# Patient Record
Sex: Female | Born: 1960 | Race: Black or African American | Hispanic: No | Marital: Single | Smoking: Former smoker
Health system: Southern US, Community
[De-identification: ages and names within clinical notes are randomized; demographics above are authoritative.]

## PROBLEM LIST (undated history)

## (undated) DIAGNOSIS — I2510 Atherosclerotic heart disease of native coronary artery without angina pectoris: Secondary | ICD-10-CM

## (undated) DIAGNOSIS — I5032 Chronic diastolic (congestive) heart failure: Secondary | ICD-10-CM

## (undated) DIAGNOSIS — J449 Chronic obstructive pulmonary disease, unspecified: Secondary | ICD-10-CM

## (undated) DIAGNOSIS — I10 Essential (primary) hypertension: Secondary | ICD-10-CM

## (undated) HISTORY — PX: ABDOMINAL HYSTERECTOMY: SHX81

---

## 2005-06-08 ENCOUNTER — Emergency Department: Payer: Self-pay | Admitting: Emergency Medicine

## 2010-07-01 ENCOUNTER — Ambulatory Visit: Payer: Self-pay | Admitting: Family Medicine

## 2011-07-16 ENCOUNTER — Ambulatory Visit: Payer: Self-pay | Admitting: Family Medicine

## 2012-01-20 ENCOUNTER — Ambulatory Visit: Payer: Self-pay | Admitting: General Surgery

## 2012-07-19 ENCOUNTER — Ambulatory Visit: Payer: Self-pay | Admitting: Family Medicine

## 2013-07-19 ENCOUNTER — Ambulatory Visit: Payer: Self-pay | Admitting: Family Medicine

## 2013-07-28 ENCOUNTER — Ambulatory Visit: Payer: Self-pay | Admitting: Family Medicine

## 2014-07-25 ENCOUNTER — Ambulatory Visit
Admit: 2014-07-25 | Disposition: A | Discharge status: Home / Self Care | Payer: Self-pay | Attending: Family Medicine | Admitting: Family Medicine

## 2021-08-23 ENCOUNTER — Other Ambulatory Visit: Payer: Self-pay

## 2021-08-23 ENCOUNTER — Emergency Department
Admission: EM | Admit: 2021-08-23 | Discharge: 2021-08-23 | Disposition: A | Discharge status: Home / Self Care | Payer: Self-pay | Attending: Emergency Medicine | Admitting: Emergency Medicine

## 2021-08-23 ENCOUNTER — Emergency Department: Payer: Self-pay

## 2021-08-23 DIAGNOSIS — E871 Hypo-osmolality and hyponatremia: Secondary | ICD-10-CM | POA: Insufficient documentation

## 2021-08-23 DIAGNOSIS — R42 Dizziness and giddiness: Secondary | ICD-10-CM | POA: Insufficient documentation

## 2021-08-23 DIAGNOSIS — E876 Hypokalemia: Secondary | ICD-10-CM | POA: Insufficient documentation

## 2021-08-23 HISTORY — DX: Essential (primary) hypertension: I10

## 2021-08-23 LAB — CBC
HCT: 40.6 % (ref 36.0–46.0)
Hemoglobin: 13.7 g/dL (ref 12.0–15.0)
MCH: 30.5 pg (ref 26.0–34.0)
MCHC: 33.7 g/dL (ref 30.0–36.0)
MCV: 90.4 fL (ref 80.0–100.0)
Platelets: 351 10*3/uL (ref 150–400)
RBC: 4.49 MIL/uL (ref 3.87–5.11)
RDW: 11.5 % (ref 11.5–15.5)
WBC: 8.7 10*3/uL (ref 4.0–10.5)
nRBC: 0 % (ref 0.0–0.2)

## 2021-08-23 LAB — URINALYSIS, ROUTINE W REFLEX MICROSCOPIC
Bilirubin Urine: NEGATIVE
Glucose, UA: NEGATIVE mg/dL
Hgb urine dipstick: NEGATIVE
Ketones, ur: NEGATIVE mg/dL
Leukocytes,Ua: NEGATIVE
Nitrite: NEGATIVE
Protein, ur: NEGATIVE mg/dL
Specific Gravity, Urine: 1.015 (ref 1.005–1.030)
pH: 7 (ref 5.0–8.0)

## 2021-08-23 LAB — COMPREHENSIVE METABOLIC PANEL
ALT: 12 U/L (ref 0–44)
AST: 19 U/L (ref 15–41)
Albumin: 4.3 g/dL (ref 3.5–5.0)
Alkaline Phosphatase: 80 U/L (ref 38–126)
Anion gap: 8 (ref 5–15)
BUN: 9 mg/dL (ref 8–23)
CO2: 34 mmol/L — ABNORMAL HIGH (ref 22–32)
Calcium: 9.3 mg/dL (ref 8.9–10.3)
Chloride: 87 mmol/L — ABNORMAL LOW (ref 98–111)
Creatinine, Ser: 0.65 mg/dL (ref 0.44–1.00)
GFR, Estimated: >60 mL/min (ref >60)
Glucose, Bld: 106 mg/dL — ABNORMAL HIGH (ref 70–99)
Potassium: 3 mmol/L — ABNORMAL LOW (ref 3.5–5.1)
Sodium: 129 mmol/L — ABNORMAL LOW (ref 135–145)
Total Bilirubin: 0.8 mg/dL (ref 0.3–1.2)
Total Protein: 8.2 g/dL — ABNORMAL HIGH (ref 6.5–8.1)

## 2021-08-23 MED ORDER — MECLIZINE HCL 25 MG PO TABS: 25.0000 mg | ORAL_TABLET | Freq: Three times a day (TID) | ORAL | 0 refills | Status: DC | PRN

## 2021-08-23 MED ORDER — POTASSIUM CHLORIDE CRYS ER 20 MEQ PO TBCR: 20.0000 meq | EXTENDED_RELEASE_TABLET | Freq: Two times a day (BID) | ORAL | 0 refills | Status: DC

## 2021-08-23 MED ORDER — FLUTICASONE PROPIONATE 50 MCG/ACT NA SUSP: 1.0000 | Freq: Two times a day (BID) | NASAL | 0 refills | Status: DC

## 2021-08-23 MED ORDER — POTASSIUM CHLORIDE CRYS ER 20 MEQ PO TBCR
40.0000 meq | EXTENDED_RELEASE_TABLET | Freq: Once | ORAL | Status: AC
  Administered 2021-08-23: 40 meq via ORAL
  Filled 2021-08-23: qty 2

## 2021-08-23 NOTE — ED Provider Notes (Signed)
[facility] Provider Note  Patient Contact: 4:58 PM (approximate)   History   Dizziness   HPI  Katie Moody is a 61 y.o. female who presents the emergency department complaining of dizziness.  Patient states that she felt like she was spinning.  She has had some allergy symptoms with nasal congestion and sneezing.  However she denies other complaint such as fevers, ear pain, sore throat, cough.  She denies any chest pain, shortness of breath.  When she arrived patient had an O2 saturation of 90% and was put on 2 L O2.  She does not wear oxygen at home, does not feel short of breath, states that she is doing her normal activity without any dyspnea on exertion either.  Patient denies any GI complaints.  No urinary symptoms to include dysuria, polyuria, hematuria.  She states that she has had vertigo in the past and this feels similar.  Patient states that currently sitting still she has no dizziness.     Physical Exam   Triage Vital Signs: ED Triage Vitals  Enc Vitals Group     BP 08/23/21 1541 130/88     Pulse Rate 08/23/21 1541 82     Resp 08/23/21 1541 20     Temp 08/23/21 1542 98.4 F (36.9 C)     Temp Source 08/23/21 1542 Oral     SpO2 08/23/21 1541 90 %     Weight 08/23/21 1540 115 lb (52.2 kg)     Height 08/23/21 1540 5\' 8"  (1.727 m)     Head Circumference --      Peak Flow --      Pain Score 08/23/21 1540 0     Pain Loc --      Pain Edu? --      Excl. in GC? --     Most recent vital signs: Vitals:   08/23/21 1730 08/23/21 1830  BP: 139/81 124/82  Pulse: 65 65  Resp: 20 15  Temp:    SpO2: 100% 97%     General: Alert and in no acute distress. Eyes:  PERRL. EOMI Head: No acute traumatic findings ENT:      Ears: EACs unremarkable bilaterally.  Minimal bulging of the TMs bilaterally.  No injection of the TMs.      Nose: No congestion/rhinnorhea.      Mouth/Throat: Mucous membranes are moist. Neck: No stridor. No cervical spine  tenderness to palpation. Hematological/Lymphatic/Immunilogical: No cervical lymphadenopathy. Cardiovascular:  Good peripheral perfusion.  No murmurs, rubs, gallops. Respiratory: Normal respiratory effort without tachypnea or retractions. Lungs CTAB. Good air entry to the bases with no decreased or absent breath sounds. Gastrointestinal: Bowel sounds 4 quadrants. Soft and nontender to palpation. No guarding or rigidity. No palpable masses. No distention. No CVA tenderness. Musculoskeletal: Full range of motion to all extremities.  Neurologic:  No gross focal neurologic deficits are appreciated.  Cranial nerves II through XII grossly intact.  Negative Romberg's and pronator drift.  Patient was visualized walking and was able to walk in a straight line without any drift. Skin:   No rash noted Other:   ED Results / Procedures / Treatments   Labs (all labs ordered are listed, but only abnormal results are displayed) Labs Reviewed  URINALYSIS, ROUTINE W REFLEX MICROSCOPIC - Abnormal; Notable for the following components:      Result Value   Color, Urine YELLOW (*)    APPearance CLEAR (*)    All other components within normal limits  COMPREHENSIVE METABOLIC PANEL - Abnormal; Notable for the following components:   Sodium 129 (*)    Potassium 3.0 (*)    Chloride 87 (*)    CO2 34 (*)    Glucose, Bld 106 (*)    Total Protein 8.2 (*)    All other components within normal limits  CBC     EKG  ED ECG REPORT I, Delorise Royals Kamari Buch,  personally viewed and interpreted this ECG.   Date: 08/23/2021  EKG Time: 1548 hrs.  Rate: 77 bpm  Rhythm: Normal sinus rhythm.  No previous EKGs for comparison.  Flattening of the T waves in the septal leads, T wave abnormality in the lateral leads.  Axis: Normal axis  Intervals:none  ST&T Change: No ST elevation or depression noted.  Flattening of the T waves in the V1 and V2 leads, inverted T waves in the lateral leads.  Normal sinus rhythm.  No  STEMI.  Nonspecific T wave changes.  No previous EKG for comparison.    RADIOLOGY  I personally viewed, evaluated, and interpreted these images as part of my medical decision making, as well as reviewing the written report by the radiologist.  ED Provider Interpretation: Emphysema identified on chest x-ray but no acute cardiopulmonary findings.  DG Chest 2 View  Result Date: 08/23/2021 CLINICAL DATA:  shortness of breath EXAM: CHEST - 2 VIEW COMPARISON:  None available FINDINGS: Cardiomediastinal silhouette and pulmonary vasculature are within normal limits. Diffuse atherosclerotic calcifications of the thoracic aorta. Lungs are hyperexpanded, but otherwise clear clear. IMPRESSION: 1. No acute cardiopulmonary process 2. Emphysema Electronically Signed   By: Acquanetta Belling M.D.   On: 08/23/2021 17:28    PROCEDURES:  Critical Care performed: No  Procedures   MEDICATIONS ORDERED IN ED: Medications  potassium chloride SA (KLOR-CON M) CR tablet 40 mEq (has no administration in time range)     IMPRESSION / MDM / ASSESSMENT AND PLAN / ED COURSE  I reviewed the triage vital signs and the nursing notes.                              Differential diagnosis includes, but is not limited to, vertigo, CVA, hypoxia, pneumonia, COPD, emphysema, anemia, electrolyte abnormality, UTI   Patient's diagnosis is consistent with vertigo, hypokalemia, hyponatremia.  Patient presented to the ED with some dizziness with no other accompanying symptoms.  Her O2 saturation was 90% in triage that she did not endorse any shortness of breath.  She does have emphysema and immediately trended up to 100% on oxygen.  While in the room evaluating the patient I stopped patient's O2, she maintained between 92 to 94% without oxygen in the room. Her exam was reassuring at this time.  She was not currently experiencing symptoms.  She states that she does have a history of vertigo and symptoms feel similar.  Slight hyponatremia  and slight hypokalemia.  We will replenish potassium orally.  Encourage patient to have good oral hydration at home.  Do not feel that patient requires neuro imaging at this time.  During her stay, remove the patient's oxygen, she was sleeping on my reevaluation and still had an O2 saturation of 94%.  Do not feel that the patient truly had an increased oxygen demand.  At this time patient will be treated for vertigo, hypokalemia and instructions on how to correct hyponatremia at home.  Return precautions discussed at length with the patient.  Follow-up primary  care as needed.  Patient is given ED precautions to return to the ED for any worsening or new symptoms.        FINAL CLINICAL IMPRESSION(S) / ED DIAGNOSES   Final diagnoses:  Vertigo  Hypokalemia  Hyponatremia     Rx / DC Orders   ED Discharge Orders          Ordered    fluticasone (FLONASE) 50 MCG/ACT nasal spray  2 times daily        08/23/21 1904    meclizine (ANTIVERT) 25 MG tablet  3 times daily PRN        08/23/21 1904    potassium chloride SA (KLOR-CON M) 20 MEQ tablet  2 times daily        08/23/21 1904             Note:  This document was prepared using Dragon voice recognition software and may include unintentional dictation errors.   Lanette HampshireCuthriell, Selden Noteboom D, PA-C 08/23/21 Orland [id number]    Stafford, Phillip, MD 08/23/21 [phone]

## 2021-08-23 NOTE — ED Notes (Signed)
Pt placed on 2L [place]

## 2021-08-23 NOTE — ED Triage Notes (Signed)
Pt states she has been having dizziness since last night- pt state she feels like she is gonna fall when she walks- pt denies cp, ha, and shob

## 2022-02-04 ENCOUNTER — Other Ambulatory Visit: Payer: Self-pay

## 2022-02-04 DIAGNOSIS — Z1231 Encounter for screening mammogram for malignant neoplasm of breast: Secondary | ICD-10-CM

## 2022-02-09 ENCOUNTER — Ambulatory Visit: Payer: Self-pay | Attending: Hematology and Oncology | Admitting: Hematology and Oncology

## 2022-02-09 ENCOUNTER — Ambulatory Visit
Admission: RE | Admit: 2022-02-09 | Discharge: 2022-02-09 | Disposition: A | Discharge status: Home / Self Care | Payer: Self-pay | Source: Ambulatory Visit | Attending: Obstetrics and Gynecology | Admitting: Obstetrics and Gynecology

## 2022-02-09 VITALS — BP 125/76 | Wt 109.3 lb

## 2022-02-09 DIAGNOSIS — Z1231 Encounter for screening mammogram for malignant neoplasm of breast: Secondary | ICD-10-CM | POA: Insufficient documentation

## 2022-02-09 NOTE — Progress Notes (Signed)
[name] is a 61 y.o. female who presents to [facility] clinic today with no complaints.    Pap Smear: Pap not smear completed today. Last Pap smear was prior to 1996 at which time she had hysterectomy for benign fibroids. Per patient has no history of an abnormal Pap smear. Last Pap smear result is not available in Epic.   Physical exam: Breasts Breasts symmetrical. No skin abnormalities bilateral breasts. No nipple retraction bilateral breasts. No nipple discharge bilateral breasts. No lymphadenopathy. No lumps palpated bilateral breasts.        Pelvic/Bimanual Pap is not indicated today    Smoking History: Patient has is a current smoker at 1/2 packs per day and was referred to quit line.    Patient Navigation: Patient education provided. Access to services provided for patient through [facility] program. No interpreter provided. No transportation provided   Colorectal Cancer Screening: Per patient has had colonoscopy completed on 2013 and was benign.  No complaints today. Declined FIT test due to constipation.   Breast and Cervical Cancer Risk Assessment: Patient does not have family history of breast cancer, known genetic mutations, or radiation treatment to the chest before age 65. Patient does not have history of cervical dysplasia, immunocompromised, or DES exposure in-utero.  Risk Assessment   No risk assessment data     A: BCCCP exam without pap smear No complaints with benign exam.   P: [facility]. Appointment scheduled 02/09/2022.  Katie Ped, NP 02/09/2022 2:47 PM

## 2022-02-09 NOTE — Patient Instructions (Signed)
Hunt about self breast awareness. Patient did not need a Pap smear today due to hysterectomy in 1996 for benign reasons. She no longer requires pap smears.  Ref[facility] to [facility] for [facility]. Appointment scheduled for 02/09/2022. Patient aware of appointment and will be there. Let patient know will follow up with her within the next couple weeks with results. Emory Leaver verbalized understanding. She will return to clinic for mammogram in one year.   Melodye Ped, NP 3:11 PM

## 2022-05-31 ENCOUNTER — Inpatient Hospital Stay
Admission: EM | Admit: 2022-05-31 | Discharge: 2022-06-08 | DRG: 871 | Disposition: A | Discharge status: Home Health | Payer: Medicaid Other | Attending: Internal Medicine | Admitting: Internal Medicine

## 2022-05-31 ENCOUNTER — Emergency Department: Payer: Medicaid Other

## 2022-05-31 ENCOUNTER — Encounter: Payer: Self-pay | Admitting: Emergency Medicine

## 2022-05-31 ENCOUNTER — Other Ambulatory Visit: Payer: Self-pay

## 2022-05-31 DIAGNOSIS — R29898 Other symptoms and signs involving the musculoskeletal system: Secondary | ICD-10-CM | POA: Insufficient documentation

## 2022-05-31 DIAGNOSIS — M4802 Spinal stenosis, cervical region: Secondary | ICD-10-CM | POA: Diagnosis present

## 2022-05-31 DIAGNOSIS — E440 Moderate protein-calorie malnutrition: Secondary | ICD-10-CM | POA: Diagnosis present

## 2022-05-31 DIAGNOSIS — J449 Chronic obstructive pulmonary disease, unspecified: Secondary | ICD-10-CM | POA: Diagnosis not present

## 2022-05-31 DIAGNOSIS — J9601 Acute respiratory failure with hypoxia: Secondary | ICD-10-CM | POA: Diagnosis not present

## 2022-05-31 DIAGNOSIS — E870 Hyperosmolality and hypernatremia: Secondary | ICD-10-CM | POA: Diagnosis present

## 2022-05-31 DIAGNOSIS — F17200 Nicotine dependence, unspecified, uncomplicated: Secondary | ICD-10-CM | POA: Insufficient documentation

## 2022-05-31 DIAGNOSIS — I2510 Atherosclerotic heart disease of native coronary artery without angina pectoris: Secondary | ICD-10-CM | POA: Diagnosis present

## 2022-05-31 DIAGNOSIS — R6520 Severe sepsis without septic shock: Secondary | ICD-10-CM | POA: Diagnosis present

## 2022-05-31 DIAGNOSIS — J441 Chronic obstructive pulmonary disease with (acute) exacerbation: Secondary | ICD-10-CM | POA: Diagnosis present

## 2022-05-31 DIAGNOSIS — J440 Chronic obstructive pulmonary disease with acute lower respiratory infection: Secondary | ICD-10-CM | POA: Diagnosis present

## 2022-05-31 DIAGNOSIS — J9621 Acute and chronic respiratory failure with hypoxia: Secondary | ICD-10-CM | POA: Diagnosis present

## 2022-05-31 DIAGNOSIS — M47812 Spondylosis without myelopathy or radiculopathy, cervical region: Secondary | ICD-10-CM | POA: Diagnosis present

## 2022-05-31 DIAGNOSIS — R531 Weakness: Secondary | ICD-10-CM | POA: Diagnosis not present

## 2022-05-31 DIAGNOSIS — Z681 Body mass index (BMI) 19 or less, adult: Secondary | ICD-10-CM | POA: Diagnosis not present

## 2022-05-31 DIAGNOSIS — T502X5A Adverse effect of carbonic-anhydrase inhibitors, benzothiadiazides and other diuretics, initial encounter: Secondary | ICD-10-CM | POA: Diagnosis present

## 2022-05-31 DIAGNOSIS — A419 Sepsis, unspecified organism: Secondary | ICD-10-CM | POA: Diagnosis present

## 2022-05-31 DIAGNOSIS — G8194 Hemiplegia, unspecified affecting left nondominant side: Secondary | ICD-10-CM | POA: Diagnosis present

## 2022-05-31 DIAGNOSIS — D72825 Bandemia: Secondary | ICD-10-CM | POA: Insufficient documentation

## 2022-05-31 DIAGNOSIS — I5181 Takotsubo syndrome: Secondary | ICD-10-CM

## 2022-05-31 DIAGNOSIS — E222 Syndrome of inappropriate secretion of antidiuretic hormone: Secondary | ICD-10-CM | POA: Diagnosis present

## 2022-05-31 DIAGNOSIS — J470 Bronchiectasis with acute lower respiratory infection: Secondary | ICD-10-CM | POA: Diagnosis present

## 2022-05-31 DIAGNOSIS — E876 Hypokalemia: Secondary | ICD-10-CM | POA: Diagnosis present

## 2022-05-31 DIAGNOSIS — R7989 Other specified abnormal findings of blood chemistry: Secondary | ICD-10-CM | POA: Diagnosis not present

## 2022-05-31 DIAGNOSIS — Z597 Insufficient social insurance and welfare support: Secondary | ICD-10-CM

## 2022-05-31 DIAGNOSIS — I10 Essential (primary) hypertension: Secondary | ICD-10-CM | POA: Diagnosis present

## 2022-05-31 DIAGNOSIS — R636 Underweight: Secondary | ICD-10-CM | POA: Diagnosis not present

## 2022-05-31 DIAGNOSIS — E872 Acidosis, unspecified: Secondary | ICD-10-CM | POA: Diagnosis present

## 2022-05-31 DIAGNOSIS — Z1152 Encounter for screening for COVID-19: Secondary | ICD-10-CM | POA: Diagnosis not present

## 2022-05-31 DIAGNOSIS — E871 Hypo-osmolality and hyponatremia: Secondary | ICD-10-CM | POA: Diagnosis not present

## 2022-05-31 DIAGNOSIS — I214 Non-ST elevation (NSTEMI) myocardial infarction: Secondary | ICD-10-CM

## 2022-05-31 DIAGNOSIS — I259 Chronic ischemic heart disease, unspecified: Secondary | ICD-10-CM | POA: Diagnosis not present

## 2022-05-31 DIAGNOSIS — J9622 Acute and chronic respiratory failure with hypercapnia: Secondary | ICD-10-CM | POA: Diagnosis present

## 2022-05-31 DIAGNOSIS — R5381 Other malaise: Secondary | ICD-10-CM | POA: Diagnosis not present

## 2022-05-31 DIAGNOSIS — R931 Abnormal findings on diagnostic imaging of heart and coronary circulation: Secondary | ICD-10-CM | POA: Diagnosis present

## 2022-05-31 DIAGNOSIS — J189 Pneumonia, unspecified organism: Secondary | ICD-10-CM | POA: Diagnosis present

## 2022-05-31 DIAGNOSIS — J9602 Acute respiratory failure with hypercapnia: Secondary | ICD-10-CM | POA: Diagnosis not present

## 2022-05-31 DIAGNOSIS — Z79899 Other long term (current) drug therapy: Secondary | ICD-10-CM

## 2022-05-31 LAB — BASIC METABOLIC PANEL
Anion gap: 15 (ref 5–15)
Anion gap: 13 (ref 5–15)
Anion gap: 13 (ref 5–15)
Anion gap: 11 (ref 5–15)
BUN: 9 mg/dL (ref 8–23)
BUN: 8 mg/dL (ref 8–23)
BUN: 9 mg/dL (ref 8–23)
BUN: 9 mg/dL (ref 8–23)
CO2: 30 mmol/L (ref 22–32)
CO2: 27 mmol/L (ref 22–32)
CO2: 25 mmol/L (ref 22–32)
CO2: 27 mmol/L (ref 22–32)
Calcium: 8.5 mg/dL — ABNORMAL LOW (ref 8.9–10.3)
Calcium: 7.9 mg/dL — ABNORMAL LOW (ref 8.9–10.3)
Calcium: 7.8 mg/dL — ABNORMAL LOW (ref 8.9–10.3)
Calcium: 7.8 mg/dL — ABNORMAL LOW (ref 8.9–10.3)
Chloride: 65 mmol/L — ABNORMAL LOW (ref 98–111)
Chloride: 73 mmol/L — ABNORMAL LOW (ref 98–111)
Chloride: 76 mmol/L — ABNORMAL LOW (ref 98–111)
Chloride: 76 mmol/L — ABNORMAL LOW (ref 98–111)
Creatinine, Ser: 0.64 mg/dL (ref 0.44–1.00)
Creatinine, Ser: 0.57 mg/dL (ref 0.44–1.00)
Creatinine, Ser: 0.46 mg/dL (ref 0.44–1.00)
Creatinine, Ser: 0.46 mg/dL (ref 0.44–1.00)
GFR, Estimated: >60 mL/min (ref >60)
GFR, Estimated: >60 mL/min (ref >60)
GFR, Estimated: >60 mL/min (ref >60)
GFR, Estimated: >60 mL/min (ref >60)
Glucose, Bld: 97 mg/dL (ref 70–99)
Glucose, Bld: 95 mg/dL (ref 70–99)
Glucose, Bld: 97 mg/dL (ref 70–99)
Glucose, Bld: 97 mg/dL (ref 70–99)
Potassium: 2.8 mmol/L — ABNORMAL LOW (ref 3.5–5.1)
Potassium: 3 mmol/L — ABNORMAL LOW (ref 3.5–5.1)
Potassium: 3.2 mmol/L — ABNORMAL LOW (ref 3.5–5.1)
Potassium: 3.5 mmol/L (ref 3.5–5.1)
Sodium: 110 mmol/L — CL (ref 135–145)
Sodium: 113 mmol/L — CL (ref 135–145)
Sodium: 114 mmol/L — CL (ref 135–145)
Sodium: 114 mmol/L — CL (ref 135–145)

## 2022-05-31 LAB — URINALYSIS, W/ REFLEX TO CULTURE (INFECTION SUSPECTED)
Bacteria, UA: NONE SEEN
Bilirubin Urine: NEGATIVE
Glucose, UA: NEGATIVE mg/dL
Hgb urine dipstick: NEGATIVE
Ketones, ur: 20 mg/dL — AB
Leukocytes,Ua: NEGATIVE
Nitrite: NEGATIVE
Protein, ur: NEGATIVE mg/dL
Specific Gravity, Urine: 1.043 — ABNORMAL HIGH (ref 1.005–1.030)
pH: 7 (ref 5.0–8.0)

## 2022-05-31 LAB — COMPREHENSIVE METABOLIC PANEL
ALT: 14 U/L (ref 0–44)
AST: 29 U/L (ref 15–41)
Albumin: 3.2 g/dL — ABNORMAL LOW (ref 3.5–5.0)
Alkaline Phosphatase: 67 U/L (ref 38–126)
BUN: 10 mg/dL (ref 8–23)
CO2: 33 mmol/L — ABNORMAL HIGH (ref 22–32)
Calcium: 8.4 mg/dL — ABNORMAL LOW (ref 8.9–10.3)
Chloride: <65 mmol/L — CL (ref 98–111)
Creatinine, Ser: 0.68 mg/dL (ref 0.44–1.00)
GFR, Estimated: >60 mL/min (ref >60)
Glucose, Bld: 101 mg/dL — ABNORMAL HIGH (ref 70–99)
Potassium: 2.8 mmol/L — ABNORMAL LOW (ref 3.5–5.1)
Sodium: 110 mmol/L — CL (ref 135–145)
Total Bilirubin: 1.3 mg/dL — ABNORMAL HIGH (ref 0.3–1.2)
Total Protein: 6.4 g/dL — ABNORMAL LOW (ref 6.5–8.1)

## 2022-05-31 LAB — BLOOD GAS, VENOUS
Acid-Base Excess: 7.4 mmol/L — ABNORMAL HIGH (ref 0.0–2.0)
Bicarbonate: 35 mmol/L — ABNORMAL HIGH (ref 20.0–28.0)
O2 Saturation: 47.2 %
Patient temperature: 37
pCO2, Ven: 62 mmHg — ABNORMAL HIGH (ref 44–60)
pH, Ven: 7.36 (ref 7.25–7.43)
pO2, Ven: 33 mmHg (ref 32–45)

## 2022-05-31 LAB — RESP PANEL BY RT-PCR (RSV, FLU A&B, COVID)  RVPGX2
Influenza A by PCR: NEGATIVE
Influenza B by PCR: NEGATIVE
Resp Syncytial Virus by PCR: NEGATIVE
SARS Coronavirus 2 by RT PCR: NEGATIVE

## 2022-05-31 LAB — CBC
Hemoglobin: 14 g/dL (ref 12.0–15.0)
Platelets: 395 10*3/uL (ref 150–400)
WBC: 27.7 10*3/uL — ABNORMAL HIGH (ref 4.0–10.5)

## 2022-05-31 LAB — LIPASE, BLOOD: Lipase: 59 U/L — ABNORMAL HIGH (ref 11–51)

## 2022-05-31 LAB — PROTIME-INR
INR: 1.1 (ref 0.8–1.2)
Prothrombin Time: 13.9 seconds (ref 11.4–15.2)

## 2022-05-31 LAB — TSH: TSH: 0.184 u[IU]/mL — ABNORMAL LOW (ref 0.350–4.500)

## 2022-05-31 LAB — TROPONIN I (HIGH SENSITIVITY)
Troponin I (High Sensitivity): 935 ng/L (ref <18)
Troponin I (High Sensitivity): 1011 ng/L (ref <18)
Troponin I (High Sensitivity): 1582 ng/L (ref <18)

## 2022-05-31 LAB — HEPARIN LEVEL (UNFRACTIONATED): Heparin Unfractionated: <0.1 IU/mL — ABNORMAL LOW (ref 0.30–0.70)

## 2022-05-31 LAB — T4, FREE: Free T4: 1.23 ng/dL — ABNORMAL HIGH (ref 0.61–1.12)

## 2022-05-31 LAB — GLUCOSE, CAPILLARY: Glucose-Capillary: 77 mg/dL (ref 70–99)

## 2022-05-31 LAB — LACTIC ACID, PLASMA
Lactic Acid, Venous: 3 mmol/L (ref 0.5–1.9)
Lactic Acid, Venous: 2.7 mmol/L (ref 0.5–1.9)

## 2022-05-31 LAB — OSMOLALITY, URINE: Osmolality, Ur: 295 mOsm/kg — ABNORMAL LOW (ref 300–900)

## 2022-05-31 LAB — APTT: aPTT: 29 seconds (ref 24–36)

## 2022-05-31 LAB — CORTISOL: Cortisol, Plasma: 18 ug/dL

## 2022-05-31 LAB — OSMOLALITY: Osmolality: 241 mOsm/kg — CL (ref 275–295)

## 2022-05-31 MED ORDER — HEPARIN (PORCINE) 25000 UT/250ML-% IV SOLN
INTRAVENOUS | Status: AC
  Administered 2022-05-31: 550 [IU]/h via INTRAVENOUS
  Filled 2022-05-31: qty 250

## 2022-05-31 MED ORDER — HEPARIN SODIUM (PORCINE) 5000 UNIT/ML IJ SOLN: 5000.0000 [IU] | Freq: Three times a day (TID) | INTRAMUSCULAR | Status: DC

## 2022-05-31 MED ORDER — SODIUM CHLORIDE 0.9 % IV BOLUS
1000.0000 mL | Freq: Once | INTRAVENOUS | Status: AC
  Administered 2022-05-31: 1000 mL via INTRAVENOUS

## 2022-05-31 MED ORDER — SODIUM CHLORIDE 0.9 % IV SOLN
500.0000 mg | Freq: Once | INTRAVENOUS | Status: AC
  Administered 2022-05-31: 500 mg via INTRAVENOUS
  Filled 2022-05-31: qty 5

## 2022-05-31 MED ORDER — HEPARIN (PORCINE) 25000 UT/250ML-% IV SOLN
900.0000 [IU]/h | INTRAVENOUS | Status: DC
  Administered 2022-06-01 – 2022-06-03 (×2): 900 [IU]/h via INTRAVENOUS
  Filled 2022-05-31 (×2): qty 250

## 2022-05-31 MED ORDER — SODIUM CHLORIDE 0.9 % IV SOLN
1.0000 g | Freq: Once | INTRAVENOUS | Status: AC
  Administered 2022-05-31: 1 g via INTRAVENOUS
  Filled 2022-05-31: qty 10

## 2022-05-31 MED ORDER — ACETAMINOPHEN 325 MG PO TABS
650.0000 mg | ORAL_TABLET | ORAL | Status: DC | PRN
  Administered 2022-05-31: 650 mg via ORAL
  Filled 2022-05-31: qty 2

## 2022-05-31 MED ORDER — SODIUM CHLORIDE 0.9 % IV SOLN: INTRAVENOUS | Status: DC | PRN

## 2022-05-31 MED ORDER — POTASSIUM CHLORIDE 10 MEQ/100ML IV SOLN
10.0000 meq | Freq: Once | INTRAVENOUS | Status: DC
  Filled 2022-05-31: qty 100

## 2022-05-31 MED ORDER — SODIUM CHLORIDE 0.9 % IV SOLN: INTRAVENOUS | Status: DC

## 2022-05-31 MED ORDER — POTASSIUM CHLORIDE 10 MEQ/100ML IV SOLN
10.0000 meq | INTRAVENOUS | Status: AC
  Administered 2022-05-31 (×3): 10 meq via INTRAVENOUS
  Filled 2022-05-31 (×3): qty 100

## 2022-05-31 MED ORDER — DOCUSATE SODIUM 100 MG PO CAPS: 100.0000 mg | ORAL_CAPSULE | Freq: Two times a day (BID) | ORAL | Status: DC | PRN

## 2022-05-31 MED ORDER — METHYLPREDNISOLONE SODIUM SUCC 125 MG IJ SOLR
125.0000 mg | Freq: Once | INTRAMUSCULAR | Status: AC
  Administered 2022-05-31: 125 mg via INTRAVENOUS
  Filled 2022-05-31: qty 2

## 2022-05-31 MED ORDER — HEPARIN BOLUS VIA INFUSION
2850.0000 [IU] | Freq: Once | INTRAVENOUS | Status: AC
  Administered 2022-05-31: 2850 [IU] via INTRAVENOUS
  Filled 2022-05-31: qty 2850

## 2022-05-31 MED ORDER — ONDANSETRON HCL 4 MG/2ML IJ SOLN: 4.0000 mg | Freq: Four times a day (QID) | INTRAMUSCULAR | Status: DC | PRN

## 2022-05-31 MED ORDER — IPRATROPIUM-ALBUTEROL 0.5-2.5 (3) MG/3ML IN SOLN
9.0000 mL | Freq: Once | RESPIRATORY_TRACT | Status: AC
  Administered 2022-05-31: 9 mL via RESPIRATORY_TRACT
  Filled 2022-05-31: qty 3

## 2022-05-31 MED ORDER — IOHEXOL 350 MG/ML SOLN
75.0000 mL | Freq: Once | INTRAVENOUS | Status: AC | PRN
  Administered 2022-05-31: 75 mL via INTRAVENOUS

## 2022-05-31 MED ORDER — POLYETHYLENE GLYCOL 3350 17 G PO PACK: 17.0000 g | PACK | Freq: Every day | ORAL | Status: DC | PRN

## 2022-05-31 MED ORDER — SODIUM CHLORIDE 0.9 % IV SOLN: Freq: Once | INTRAVENOUS | Status: AC

## 2022-05-31 NOTE — ED Triage Notes (Signed)
First Nurse Note Pt via EMS from home. Pt c/o weakness for the past couple of day. Pt wants to get tested for flu and COVID. Lung sounds are clear per EMS. Pt was placed on 3L [place]. Pt is [id number]

## 2022-05-31 NOTE — Progress Notes (Signed)
eLink Physician-Brief Progress Note Patient Name: Katie Moody DOB: 1960/07/23 MRN: [id number]   Date of Service  05/31/2022  HPI/Events of Note  Brief new admit note: 62 yr old female with cough, sob admitted to ICU for  Sepsis. COPD exacerbation/CAP. typ2 resp failure on nasal o2. LA 2.7. got IV bolus, on CAP Rx. CTA no PE.Covid/flu neg.  Elevated troponin, NSTEMI- on heparin drip , protocol. Troponin trending down. Severe hyponatremia, hypokalemia on NS and Kcl replacements. Neuro wise stable. No sz. 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 Smoker. cr normal   Camera : VS stable, on nasal o2. Talking to bed side RN. Non focal. RR is fine. Sats > 96%.    eICU Interventions  Asp and sz precautions Trend sodium closely, hyponatremia work up. Not on 3% saline.  Trend troponin.  CBG goals < 180     Intervention Category Major Interventions: Sepsis - evaluation and management Evaluation Type: New Patient Evaluation  Elmer Sow 05/31/2022, 8:27 PM

## 2022-05-31 NOTE — ED Notes (Signed)
RN unable to get enough blood draw back from [facility] for VBG. Md made aware.

## 2022-05-31 NOTE — ED Notes (Signed)
Advised nurse that patient has ready bed 

## 2022-05-31 NOTE — ED Provider Notes (Signed)
[facility] Provider Note    Event Date/Time   First MD Initiated Contact with Patient 05/31/22 1340     (approximate)   History   Weakness   HPI  Katie Moody is a 62 y.o. female past medical history significant for hypertension, tobacco use, who presents to the emergency department shortness of breath and not feeling well.  Endorses a productive cough over the past 5 days.  Progressively worsening shortness of breath.  Worsening weakness over the past 2 days and states that today she had a fall secondary to weakness and was unable to get up on her own.  Sister assisted her up.  Downtime of 20 minutes.  Denies any head injury or loss of consciousness.  Not on anticoagulation.  Denies any chest pain.  Denies nausea vomiting or diarrhea.  No blood in her stool.  Denies prior history of DVT or PE.  Denies prior history of COPD or oxygen use.  On arrival to the emergency department patient was 91% on room air with increased work of breathing, placed on 3 L nasal cannula with improvement to 94%.  Denies abdominal pain, denies dysuria, urinary urgency or frequency.     Physical Exam   Triage Vital Signs: ED Triage Vitals  Enc Vitals Group     BP 05/31/22 1325 111/80     Pulse Rate 05/31/22 1325 (!) 103     Resp 05/31/22 1325 18     Temp 05/31/22 1325 (!) 97.5 F (36.4 C)     Temp Source 05/31/22 1325 Oral     SpO2 05/31/22 1325 91 %     Weight 05/31/22 1324 105 lb (47.6 kg)     Height 05/31/22 1324 '5\' 8"'$  (1.727 m)     Head Circumference --      Peak Flow --      Pain Score 05/31/22 1331 0     Pain Loc --      Pain Edu? --      Excl. in [place]? --     Most recent vital signs: Vitals:   05/31/22 1340 05/31/22 1400  BP:  109/85  Pulse: 99 97  Resp: 18 19  Temp:    SpO2: 92% 97%    Physical Exam Constitutional:      Appearance: She is well-developed. She is ill-appearing.  HENT:     Head: Atraumatic.  Eyes:     Conjunctiva/sclera: Conjunctivae  normal.  Cardiovascular:     Rate and Rhythm: Regular rhythm.  Pulmonary:     Effort: Respiratory distress present.     Comments: 3 L nasal cannula at 94%.  Tachypneic.  Speaking in full sentences.  Diffuse inspiratory and expiratory wheezing throughout all lung fields. Abdominal:     General: There is no distension.  Musculoskeletal:        General: Normal range of motion.     Cervical back: Normal range of motion.  Skin:    General: Skin is warm.  Neurological:     Mental Status: She is alert. Mental status is at baseline.     IMPRESSION / MDM / ASSESSMENT AND PLAN / ED COURSE  I reviewed the triage vital signs and the nursing notes.  Differential diagnosis including pneumonia, COPD exacerbation, ACS, viral infection including COVID and influenza, hyperglycemia, anemia  EKG  I, Nathaniel Man, the attending physician, personally viewed and interpreted this ECG.   Rate: Normal  Rhythm: Normal sinus  Axis: Normal  Intervals: Signs of atrial  hypertrophy  ST&T Change: Nonspecific  No tachycardic or bradycardic dysrhythmias while on cardiac telemetry.  RADIOLOGY I independently reviewed imaging, my interpretation of imaging: Chest x-ray no signs of pneumonia.  Read as no acute lung findings.  Colonic gas distention in the upper abdomen recommended clinically correlating.  Patient is nontender to palpation with no abdominal pain.  LABS (all labs ordered are listed, but only abnormal results are displayed) Labs interpreted as -    Labs Reviewed  CULTURE, BLOOD (ROUTINE X 2)  CULTURE, BLOOD (ROUTINE X 2)  RESP PANEL BY RT-PCR (RSV, FLU A&B, COVID)  RVPGX2  PROTIME-INR  APTT  LACTIC ACID, PLASMA  LACTIC ACID, PLASMA  COMPREHENSIVE METABOLIC PANEL  URINALYSIS, W/ REFLEX TO CULTURE (INFECTION SUSPECTED)  CBC  BLOOD GAS, VENOUS  CBG MONITORING, ED  TROPONIN I (HIGH SENSITIVITY)    TREATMENT  1 L of IV fluids, DuoNebs, IV Solu-Medrol  MDM    Clinical picture  concerning for possible COPD exacerbation/new diagnosis given her tobacco use and wheezing with productive cough.  Patient was given DuoNebs and IV Solu-Medrol.  Added on a venous blood gas.  Blood cultures obtained.  Felt that 30 cc/kg of IV fluids may be detrimental to the patient given her shortness of breath, will give 1 L of IV fluids and reevaluate.  Patient will need admitted for generalized weakness and concern for COPD exacerbation -currently workup is pending.  Lab work is currently in process.   PROCEDURES:  Critical Care performed: No  Procedures  Patient's presentation is most consistent with acute presentation with potential threat to life or bodily function.   MEDICATIONS ORDERED IN ED: Medications  sodium chloride 0.9 % bolus 1,000 mL (1,000 mLs Intravenous New Bag/Given 05/31/22 1602)  ipratropium-albuterol (DUONEB) 0.5-2.5 (3) MG/3ML nebulizer solution 9 mL (9 mLs Nebulization Given 05/31/22 1604)  methylPREDNISolone sodium succinate (SOLU-MEDROL) 125 mg/2 mL injection 125 mg (125 mg Intravenous Given 05/31/22 1602)    FINAL CLINICAL IMPRESSION(S) / ED DIAGNOSES   Final diagnoses:  Weakness  Chronic obstructive pulmonary disease, unspecified COPD type ([place])     Rx / DC Orders   ED Discharge Orders     None        Note:  This document was prepared using Dragon voice recognition software and may include unintentional dictation errors.   Nathaniel Man, MD 05/31/22 [phone]

## 2022-05-31 NOTE — ED Notes (Addendum)
This RN did not engage with pt prior to being taken to floor.  This RN advised pt taken to floor with assistance of float nurse

## 2022-05-31 NOTE — Consult Note (Signed)
ANTICOAGULATION CONSULT NOTE - Initial Consult  Pharmacy Consult for heparin Indication: ACS/STEMI  No Known Allergies  Patient Measurements: Height: '5\' 8"'$  (172.7 cm) Weight: 47.6 kg (105 lb) IBW/kg (Calculated) : 63.9 Heparin Dosing Weight: 47.6 kg  Vital Signs: Temp: 97.5 F (36.4 C) (02/25 1325) Temp Source: Oral (02/25 1325) BP: 109/85 (02/25 1400) Pulse Rate: 86 (02/25 1620)  Labs: Recent Labs    05/31/22 1456 05/31/22 1458  APTT  --  29  LABPROT  --  13.9  INR  --  1.1  TROPONINIHS 935*  --     CrCl cannot be calculated (Patient's most recent lab result is older than the maximum 21 days allowed.).   Medical History: Past Medical History:  Diagnosis Date   Hypertension     Medications:  No evidence of PTA anticoagulation  Assessment: Pharmacy consulted to dose heparin in this 63 yo F who presents to the ED with SOB/chest pain.  Troponin 935.  CrCl 54.1  Baseline Labs: aPTT 29, INR 1.1, CBC in process   Goal of Therapy:  Heparin level 0.3-0.7 units/ml Monitor platelets by anticoagulation protocol: Yes   Plan:  Give 2850 units bolus x 1 Start heparin infusion at 550 units/hr Check anti-Xa level in 6 hours and daily while on heparin Continue to monitor H&H and platelets  Alison Murray 05/31/2022,4:34 PM

## 2022-05-31 NOTE — ED Provider Notes (Addendum)
I got signout on this patient with productive cough smoker no formal diagnosis of COPD but found with increased work of breathing wheezing and improvement with minimal nasal cannula and DuoNebs/steroid treatment.  Her labs came back markedly abnormal.  She has a troponin of 900 and she denies chest pain.  She states that her symptoms are feeling better when I reassessed her.  I started her on heparin for NSTEMI.  I ordered a CT angiogram for PE.  Vital signs remained stable.  She has a lactic acidosis of 3.0.  Given her productive cough and COPD exacerbation with lactic acidosis I started her on community-acquired pneumonia coverage with ceftriaxone azithromycin via IV.  Her electrolytes came back abnormal with a hyponatremia of 110 as well as a chloride of less than 65.  She has received 1 L of IV crystalloid normal saline and her workup so far, she has no neurologic severe symptoms like seizures, her cognition is normal, so we will defer hypertonic saline at this time and instead recheck her BMP after the crystalloid bolus to check the status of her electrolytes now.   She appears dehydrated with poor skin turgor and dry mucous membranes, frail small cachectic ill-appearing chronically looking woman.  Recheck of her BMP remained unchanged.  I spoke with both ICU Dr Mortimer Fries nephrologist Dr. Juleen China, recommendation at this time is to continue to check BMPs every 2 hours and start normal saline infusion at 75 cc/h.  ICU admission.    Critical Care performed: Yes, see critical care procedure note(s)  .Critical Care  Performed by: Lucillie Garfinkel, MD Authorized by: Lucillie Garfinkel, MD   Critical care provider statement:    Critical care time (minutes):  30   Critical care was necessary to treat or prevent imminent or life-threatening deterioration of the following conditions:  Metabolic crisis   Critical care was time spent personally by me on the following activities:  Development of treatment plan with  patient or surrogate, discussions with consultants, evaluation of patient's response to treatment, examination of patient, ordering and review of laboratory studies, ordering and review of radiographic studies, ordering and performing treatments and interventions, pulse oximetry, re-evaluation of patient's condition and review of old charts       Lucillie Garfinkel, MD 05/31/22 1713    Lucillie Garfinkel, MD 05/31/22 1750

## 2022-05-31 NOTE — H&P (Incomplete)
NAME:  Katie Moody, MRN:  [id number], DOB:  [birth year], LOS: 0 ADMISSION DATE:  05/31/2022, CONSULTATION DATE:  05/31/22 REFERRING MD: Lucillie Garfinkel, CHIEF COMPLAINT: Shortness of breath    HPI  62 y.o female with significant PMH of HTN  and tobacco use who presented to the ED with chief complaints of shortness of breath, productive cough x 5 days, generalized weakness x 2 days, dizziness, and a fall without trauma or loss of consciousness.   ED Course: Initial vital signs showed HR of 103 beats/minute, BP 111/80 mm Hg, the RR 18 breaths/minute, and the oxygen saturation 91% on 3L [place] and a temperature of 97.77F (36.4C).   Pertinent Labs/Diagnostics Findings: Na+/ K+: 110/2.8 Glucose: 101 BUN/Cr.:10/0.68  WBC:27.7 PCT: negative <0.10 Lactic acid: 3.0 COVID PCR: Negative, Troponin: [id number]   VBG result:  pO2 33; pCO2 62; pH 7.36;  HCO3 35, %O2 Sat 47.2. CXR>No focal airspace opacity.   CTA Chest> No PE. Opacification of the bilateral lower lobe posterior and lateral segmental airways, possibly from mucous plugging  Given concern for possible COPD exacerbation/new diagnosis due to her tobacco use and wheezing with productive cough. Patient was given DuoNebs and IV Solu-Medrol.  It was felt that 30 cc/kg of IV fluids may be detrimental to the pt given her shortness of breath, therefore she received 1 L of IV fluids  and started on broad spectrum abx. Labs consistent with severe hypernatremia and possible NSTEMI. Nephro consulted who recommended NS infusion with Serial Na+ checks. She was also started on Heparin gtt for NSTEMI. PCCM consulted.  Past Medical History  HTN Tobacco use  Significant Hospital Events   2/25: Admitted to the ICU with acute hypoxic hypercapnic respiratory failure secondary to AECOPD and probable pneumonia, and severe hyponatremia  Consults:  Nephrologist  Procedures:  None  Significant Diagnostic Tests:  2/25: Chest Xray>No Acute Cardiopulmonary  process  2/25: CTA Chest>1. No pulmonary embolism is seen. 2. There is opacification of the bilateral lower lobe posterior and lateral segmental airways, possibly from mucous plugging. There is associated bilateral lower lobe posterior and lateral segmental mild bronchial wall thickening and mild bronchiectasis. 3. There are scattered centrilobular and tree-in-bud airspace opacities within the bilateral lower lobes, medial segment of the right middle lobe, and anteromedial left upper lobe. This suggests mild multifocal airways disseminated infection, possibly chronic, and possibly related to aspiration. 4. There is reflux of contrast into the inferior vena cava and hepatic veins, possibly from right heart insufficiency.  Micro Data:  2/25: SARS-CoV-2 PCR> negative 2/25: Influenza PCR> negative 2/25: Blood culture x2> 2/25: MRSA PCR>>  2/26: Strep pneumo urinary antigen> 2/26: Legionella urinary antigen>  Antimicrobials:  Azithromycin 2/25> Ceftriaxone 2/25>  OBJECTIVE  Blood pressure (!) 87/66, pulse 78, temperature (!) 97.4 F (36.3 C), temperature source Oral, resp. rate 15, height '5\' 8"'$  (1.727 m), weight 47.6 kg, SpO2 97 %.      No intake or output data in the 24 hours ending 05/31/22 2004 Filed Weights   05/31/22 1324  Weight: 47.6 kg   Physical Examination  GENERAL: 62 year-old critically ill patient lying in no acute distress EYES: PEERLA. No scleral icterus. Extraocular muscles intact.  HEENT: Head atraumatic, normocephalic. Oropharynx and nasopharynx clear.  NECK:  No JVD, supple  LUNGS: Decreased breath sounds bilaterally.  No use of accessory muscles of respiration.  CARDIOVASCULAR: S1, S2 normal. No murmurs, rubs, or gallops.  ABDOMEN: Soft, NTND EXTREMITIES: No swelling or erythema.  Capillary refill is less than  3 seconds in all extremities. Pulses palpable distally. NEUROLOGIC: The patient is alert and oriented x 4. Cranial nerves are intact.  SKIN: No  obvious rash, lesion, or ulcer. Warm to touch Labs/imaging that I havepersonally reviewed  (right click and "Reselect all SmartList Selections" daily)     Labs   CBC: Recent Labs  Lab 05/31/22 1456  WBC 27.7*  HGB 14.0  HCT RESULTS UNAVAILABLE DUE TO INTERFERING SUBSTANCE  MCV RESULTS UNAVAILABLE DUE TO INTERFERING SUBSTANCE  PLT [id number]    Basic Metabolic Panel: Recent Labs  Lab 05/31/22 1628 05/31/22 1629  NA 110* 110*  K 2.8* 2.8*  CL <65* 65*  CO2 33* 30  GLUCOSE 101* 97  BUN 10 9  CREATININE 0.68 0.64  CALCIUM 8.4* 8.5*   GFR: Estimated Creatinine Clearance: 54.1 mL/min (by C-G formula based on SCr of 0.64 mg/dL). Recent Labs  Lab 05/31/22 1456 05/31/22 1457 05/31/22 1629  WBC 27.7*  --   --   LATICACIDVEN  --  3.0* 2.7*    Liver Function Tests: Recent Labs  Lab 05/31/22 1628  AST 29  ALT 14  ALKPHOS 67  BILITOT 1.3*  PROT 6.4*  ALBUMIN 3.2*   No results for input(s): "LIPASE", "AMYLASE" in the last 168 hours. No results for input(s): "AMMONIA" in the last 168 hours.  ABG No results found for: "PHART", "PCO2ART", "PO2ART", "HCO3", "TCO2", "ACIDBASEDEF", "O2SAT"   Coagulation Profile: Recent Labs  Lab 05/31/22 1458  INR 1.1    Cardiac Enzymes: No results for input(s): "CKTOTAL", "CKMB", "CKMBINDEX", "TROPONINI" in the last 168 hours.  HbA1C: No results found for: "HGBA1C"  CBG: Recent Labs  Lab 05/31/22 1927  GLUCAP 77    Review of Systems:   Review of Systems  Constitutional:  Positive for malaise/fatigue. Negative for chills, diaphoresis, fever and weight loss.  HENT: Negative.    Eyes: Negative.   Respiratory:  Positive for cough, sputum production, shortness of breath and wheezing. Negative for hemoptysis.   Cardiovascular: Negative.   Gastrointestinal:  Positive for constipation. Negative for abdominal pain, blood in stool, diarrhea, heartburn, melena, nausea and vomiting.  Genitourinary: Negative.   Musculoskeletal:   Positive for falls.  Skin: Negative.   Neurological:  Positive for dizziness and weakness.  Endo/Heme/Allergies: Negative.   Psychiatric/Behavioral: Negative.     Past Medical History  She,  has a past medical history of Hypertension.   Surgical History   History reviewed. No pertinent surgical history.   Social History   reports that she has never smoked. She has never used smokeless tobacco. She reports that she does not currently use alcohol. She reports that she does not use drugs.   Family History   Her family history is not on file.   Allergies No Known Allergies   Home Medications  Prior to Admission medications   Medication Sig Start Date End Date Taking? Authorizing Provider  fluticasone (FLONASE) 50 MCG/ACT nasal spray Place 1 spray into both nostrils 2 (two) times daily. 08/23/21  Yes Cuthriell, Charline Bills, PA-C  hydrochlorothiazide (HYDRODIURIL) 25 MG tablet Take 25 mg by mouth daily. 12/18/21  Yes [provider]  meclizine (ANTIVERT) 25 MG tablet Take 1 tablet (25 mg total) by mouth 3 (three) times daily as needed for dizziness. 08/23/21  Yes Cuthriell, Charline Bills, PA-C  PROVENTIL HFA 108 (90 Base) MCG/ACT inhaler Inhale 2 puffs into the lungs every 4 (four) hours as needed for shortness of breath or wheezing. 12/22/21  Yes [provider]  potassium chloride (KLOR-CON) 8 MEQ tablet Take by mouth. Patient not taking: Reported on 05/31/2022 12/29/21   [provider]  potassium chloride SA (KLOR-CON M) 20 MEQ tablet Take 1 tablet (20 mEq total) by mouth 2 (two) times daily for 5 days. 08/23/21 08/28/21  Cuthriell, Charline Bills, PA-C  Scheduled Meds: Continuous Infusions:  sodium chloride 75 mL/hr at 06/01/22 0200   sodium chloride 10 mL/hr at 06/01/22 0200   heparin 700 Units/hr (06/01/22 0227)   PRN Meds:.sodium chloride, acetaminophen, docusate sodium, ondansetron (ZOFRAN) IV, polyethylene glycol   Active Hospital Problem list   Acute hypoxic  hypercapnic respiratory failure CAP AECOPD NSTEMI Hyponatremia Hypokalemia HTN  Assessment & Plan:  #Acute on Chronic Hypoxic and Hypercapnic Respiratory Failure #Acute Exacerbation of COPD #CAP  Background current everyday smoker no dx of COPD. Now with hypoxic respiratory failure requiring oxygen. 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  -TSH low  with slightly elevated Free T4 Will get am cortisol.  -Hold HCTZ -Start IVFs with NS with goal serum sodium level not > 10 to 12 mEq per L in the first 24 hours and 18 mEq per L in the first 48 hours -Check serial BMP -Monitor I&O's / urinary output -Replace electrolytes as indicated -Nephrology consult.  Message sent to [facility] via haiku  NSTEMI -UDS pending -TTEcho to assess LV Function  -Trend troponins until peaked -Heparin gtt per ACS -Start high dose statin Atorvastatin 80 mg daily -ASA '81mg'$  PO daily -F/u A1c, TSH & thyroid panel, lipid panel -Cardiology Consult  HTN -Hold bp meds for now   Best practice:  Diet:  Oral Pain/Anxiety/Delirium protocol (if indicated): No VAP protocol (if indicated): Not indicated DVT prophylaxis: Systemic AC GI prophylaxis: H2B Glucose control:  SSI No Central venous access:   N/A Arterial line:  N/A Foley:  N/A Mobility:  bed rest  PT consulted: N/A Last date of multidisciplinary goals of care discussion [2/25] Code Status:  full code Disposition: Stepdown   = Goals of Care = Code Status Order: FULL  Primary Emergency Contact: Cubtherson,Nancy Wishes to pursue full aggressive treatment and intervention options, including CPR and intubation, but goals of care will be addressed on going with family if that should become necessary.   Critical care time: 78 minutes        Rufina Falco DNP, CCRN, FNP-C, AGACNP-BC Acute Care & Family Nurse Practitioner [facility] PCCM on call pager [phone]

## 2022-05-31 NOTE — ED Triage Notes (Signed)
Pt to ED via [facility] from home for near syncopal episode. Pt states that she also felt like it was hard to breath. Pt does not normally wear O2 at home but EMS placed  her on 3 liters. Pt states that she has been coughing but has not had a fever.

## 2022-06-01 ENCOUNTER — Inpatient Hospital Stay (HOSPITAL_COMMUNITY)
Admit: 2022-06-01 | Discharge: 2022-06-01 | Disposition: A | Discharge status: Home / Self Care | Payer: Medicaid Other | Attending: Student | Admitting: Student

## 2022-06-01 ENCOUNTER — Inpatient Hospital Stay: Payer: Medicaid Other

## 2022-06-01 DIAGNOSIS — G8194 Hemiplegia, unspecified affecting left nondominant side: Secondary | ICD-10-CM

## 2022-06-01 DIAGNOSIS — I214 Non-ST elevation (NSTEMI) myocardial infarction: Secondary | ICD-10-CM

## 2022-06-01 DIAGNOSIS — R6520 Severe sepsis without septic shock: Secondary | ICD-10-CM

## 2022-06-01 DIAGNOSIS — E872 Acidosis, unspecified: Secondary | ICD-10-CM

## 2022-06-01 DIAGNOSIS — D72825 Bandemia: Secondary | ICD-10-CM | POA: Insufficient documentation

## 2022-06-01 DIAGNOSIS — J9602 Acute respiratory failure with hypercapnia: Secondary | ICD-10-CM

## 2022-06-01 DIAGNOSIS — A419 Sepsis, unspecified organism: Secondary | ICD-10-CM

## 2022-06-01 DIAGNOSIS — J189 Pneumonia, unspecified organism: Secondary | ICD-10-CM

## 2022-06-01 DIAGNOSIS — R636 Underweight: Secondary | ICD-10-CM

## 2022-06-01 DIAGNOSIS — E871 Hypo-osmolality and hyponatremia: Secondary | ICD-10-CM

## 2022-06-01 DIAGNOSIS — J9601 Acute respiratory failure with hypoxia: Secondary | ICD-10-CM | POA: Insufficient documentation

## 2022-06-01 DIAGNOSIS — F17200 Nicotine dependence, unspecified, uncomplicated: Secondary | ICD-10-CM

## 2022-06-01 DIAGNOSIS — R531 Weakness: Secondary | ICD-10-CM

## 2022-06-01 DIAGNOSIS — R5381 Other malaise: Secondary | ICD-10-CM

## 2022-06-01 LAB — CBC WITH DIFFERENTIAL/PLATELET
Abs Immature Granulocytes: 0.11 10*3/uL — ABNORMAL HIGH (ref 0.00–0.07)
Basophils Absolute: 0 10*3/uL (ref 0.0–0.1)
Basophils Relative: 0 %
Eosinophils Absolute: 0 10*3/uL (ref 0.0–0.5)
Eosinophils Relative: 0 %
HCT: 32.2 % — ABNORMAL LOW (ref 36.0–46.0)
Hemoglobin: 11.5 g/dL — ABNORMAL LOW (ref 12.0–15.0)
Immature Granulocytes: 1 %
Lymphocytes Relative: 3 %
Lymphs Abs: 0.7 10*3/uL (ref 0.7–4.0)
MCH: 30.1 pg (ref 26.0–34.0)
MCHC: 35.7 g/dL (ref 30.0–36.0)
MCV: 84.3 fL (ref 80.0–100.0)
Monocytes Absolute: 1 10*3/uL (ref 0.1–1.0)
Monocytes Relative: 5 %
Neutro Abs: 17.9 10*3/uL — ABNORMAL HIGH (ref 1.7–7.7)
Neutrophils Relative %: 91 %
Platelets: 383 10*3/uL (ref 150–400)
RBC: 3.82 MIL/uL — ABNORMAL LOW (ref 3.87–5.11)
RDW: 11.2 % — ABNORMAL LOW (ref 11.5–15.5)
WBC: 19.7 10*3/uL — ABNORMAL HIGH (ref 4.0–10.5)
nRBC: 0 % (ref 0.0–0.2)

## 2022-06-01 LAB — BASIC METABOLIC PANEL
Anion gap: 12 (ref 5–15)
Anion gap: 11 (ref 5–15)
Anion gap: 9 (ref 5–15)
BUN: 9 mg/dL (ref 8–23)
BUN: 8 mg/dL (ref 8–23)
BUN: 9 mg/dL (ref 8–23)
CO2: 27 mmol/L (ref 22–32)
CO2: 27 mmol/L (ref 22–32)
CO2: 28 mmol/L (ref 22–32)
Calcium: 8.1 mg/dL — ABNORMAL LOW (ref 8.9–10.3)
Calcium: 7.9 mg/dL — ABNORMAL LOW (ref 8.9–10.3)
Calcium: 7.7 mg/dL — ABNORMAL LOW (ref 8.9–10.3)
Chloride: 82 mmol/L — ABNORMAL LOW (ref 98–111)
Chloride: 85 mmol/L — ABNORMAL LOW (ref 98–111)
Chloride: 84 mmol/L — ABNORMAL LOW (ref 98–111)
Creatinine, Ser: 0.59 mg/dL (ref 0.44–1.00)
Creatinine, Ser: 0.55 mg/dL (ref 0.44–1.00)
Creatinine, Ser: 0.45 mg/dL (ref 0.44–1.00)
GFR, Estimated: >60 mL/min (ref >60)
GFR, Estimated: >60 mL/min (ref >60)
GFR, Estimated: >60 mL/min (ref >60)
Glucose, Bld: 77 mg/dL (ref 70–99)
Glucose, Bld: 155 mg/dL — ABNORMAL HIGH (ref 70–99)
Glucose, Bld: 149 mg/dL — ABNORMAL HIGH (ref 70–99)
Potassium: 3.3 mmol/L — ABNORMAL LOW (ref 3.5–5.1)
Potassium: 3.6 mmol/L (ref 3.5–5.1)
Potassium: 3.4 mmol/L — ABNORMAL LOW (ref 3.5–5.1)
Sodium: 121 mmol/L — ABNORMAL LOW (ref 135–145)
Sodium: 123 mmol/L — ABNORMAL LOW (ref 135–145)
Sodium: 121 mmol/L — ABNORMAL LOW (ref 135–145)

## 2022-06-01 LAB — URINE DRUG SCREEN, QUALITATIVE (ARMC ONLY)
Amphetamines, Ur Screen: NOT DETECTED
Barbiturates, Ur Screen: NOT DETECTED
Benzodiazepine, Ur Scrn: NOT DETECTED
Cannabinoid 50 Ng, Ur ~~LOC~~: NOT DETECTED
Cocaine Metabolite,Ur ~~LOC~~: NOT DETECTED
MDMA (Ecstasy)Ur Screen: NOT DETECTED
Methadone Scn, Ur: NOT DETECTED
Opiate, Ur Screen: NOT DETECTED
Phencyclidine (PCP) Ur S: NOT DETECTED
Tricyclic, Ur Screen: NOT DETECTED

## 2022-06-01 LAB — COMPREHENSIVE METABOLIC PANEL
ALT: 14 U/L (ref 0–44)
AST: 25 U/L (ref 15–41)
Albumin: 2.7 g/dL — ABNORMAL LOW (ref 3.5–5.0)
Alkaline Phosphatase: 60 U/L (ref 38–126)
Anion gap: 11 (ref 5–15)
BUN: 9 mg/dL (ref 8–23)
CO2: 26 mmol/L (ref 22–32)
Calcium: 7.9 mg/dL — ABNORMAL LOW (ref 8.9–10.3)
Chloride: 80 mmol/L — ABNORMAL LOW (ref 98–111)
Creatinine, Ser: 0.6 mg/dL (ref 0.44–1.00)
GFR, Estimated: >60 mL/min (ref >60)
Glucose, Bld: 85 mg/dL (ref 70–99)
Potassium: 3.4 mmol/L — ABNORMAL LOW (ref 3.5–5.1)
Sodium: 117 mmol/L — CL (ref 135–145)
Total Bilirubin: 1 mg/dL (ref 0.3–1.2)
Total Protein: 5.6 g/dL — ABNORMAL LOW (ref 6.5–8.1)

## 2022-06-01 LAB — ECHOCARDIOGRAM COMPLETE
AR max vel: 2.62 cm2
AV Area VTI: 2.39 cm2
AV Area mean vel: 2.64 cm2
AV Mean grad: 2 mmHg
AV Peak grad: 3.5 mmHg
Ao pk vel: 0.93 m/s
Area-P 1/2: 6.17 cm2
Calc EF: 44.6 %
Est EF: 55
Height: 68 in
S' Lateral: 2.6 cm
Single Plane A2C EF: 41.3 %
Single Plane A4C EF: 45.2 %
Weight: 1738.99 oz

## 2022-06-01 LAB — MAGNESIUM: Magnesium: 1.8 mg/dL (ref 1.7–2.4)

## 2022-06-01 LAB — PHOSPHORUS: Phosphorus: 3.2 mg/dL (ref 2.5–4.6)

## 2022-06-01 LAB — HEMOGLOBIN A1C
Hgb A1c MFr Bld: 5.8 % — ABNORMAL HIGH (ref 4.8–5.6)
Mean Plasma Glucose: 120 mg/dL

## 2022-06-01 LAB — HEPARIN LEVEL (UNFRACTIONATED)
Heparin Unfractionated: <0.1 IU/mL — ABNORMAL LOW (ref 0.30–0.70)
Heparin Unfractionated: 0.1 IU/mL — ABNORMAL LOW (ref 0.30–0.70)
Heparin Unfractionated: <0.1 IU/mL — ABNORMAL LOW (ref 0.30–0.70)

## 2022-06-01 LAB — HIV ANTIBODY (ROUTINE TESTING W REFLEX): HIV Screen 4th Generation wRfx: NONREACTIVE

## 2022-06-01 LAB — MRSA NEXT GEN BY PCR, NASAL: MRSA by PCR Next Gen: NOT DETECTED

## 2022-06-01 MED ORDER — ORAL CARE MOUTH RINSE: 15.0000 mL | OROMUCOSAL | Status: DC | PRN

## 2022-06-01 MED ORDER — POTASSIUM CHLORIDE 10 MEQ/100ML IV SOLN
10.0000 meq | INTRAVENOUS | Status: AC
  Administered 2022-06-01 (×3): 10 meq via INTRAVENOUS
  Filled 2022-06-01 (×3): qty 100

## 2022-06-01 MED ORDER — IPRATROPIUM-ALBUTEROL 0.5-2.5 (3) MG/3ML IN SOLN: 3.0000 mL | RESPIRATORY_TRACT | Status: DC | PRN

## 2022-06-01 MED ORDER — GUAIFENESIN-DM 100-10 MG/5ML PO SYRP
5.0000 mL | ORAL_SOLUTION | ORAL | Status: DC | PRN
  Filled 2022-06-01: qty 10

## 2022-06-01 MED ORDER — ASPIRIN 81 MG PO TBEC
81.0000 mg | DELAYED_RELEASE_TABLET | Freq: Every day | ORAL | Status: DC
  Administered 2022-06-02 – 2022-06-08 (×7): 81 mg via ORAL
  Filled 2022-06-01 (×7): qty 1

## 2022-06-01 MED ORDER — MAGNESIUM SULFATE 2 GM/50ML IV SOLN
2.0000 g | Freq: Once | INTRAVENOUS | Status: AC
  Administered 2022-06-01: 2 g via INTRAVENOUS
  Filled 2022-06-01: qty 50

## 2022-06-01 MED ORDER — METHYLPREDNISOLONE SODIUM SUCC 40 MG IJ SOLR
40.0000 mg | Freq: Every day | INTRAMUSCULAR | Status: DC
  Administered 2022-06-01 – 2022-06-05 (×5): 40 mg via INTRAVENOUS
  Filled 2022-06-01 (×5): qty 1

## 2022-06-01 MED ORDER — SODIUM CHLORIDE 0.9 % IV SOLN
500.0000 mg | INTRAVENOUS | Status: AC
  Administered 2022-06-01 – 2022-06-02 (×2): 500 mg via INTRAVENOUS
  Filled 2022-06-01: qty 5
  Filled 2022-06-01: qty 500

## 2022-06-01 MED ORDER — POTASSIUM CHLORIDE 10 MEQ/100ML IV SOLN
10.0000 meq | INTRAVENOUS | Status: DC
  Filled 2022-06-01 (×2): qty 100

## 2022-06-01 MED ORDER — HEPARIN BOLUS VIA INFUSION
1400.0000 [IU] | Freq: Once | INTRAVENOUS | Status: AC
  Administered 2022-06-01: 1400 [IU] via INTRAVENOUS
  Filled 2022-06-01: qty 1400

## 2022-06-01 MED ORDER — GUAIFENESIN ER 600 MG PO TB12
600.0000 mg | ORAL_TABLET | Freq: Two times a day (BID) | ORAL | Status: DC
  Administered 2022-06-01 – 2022-06-08 (×15): 600 mg via ORAL
  Filled 2022-06-01 (×15): qty 1

## 2022-06-01 MED ORDER — ATORVASTATIN CALCIUM 80 MG PO TABS
80.0000 mg | ORAL_TABLET | Freq: Every day | ORAL | Status: DC
  Administered 2022-06-01 – 2022-06-04 (×4): 80 mg via ORAL
  Filled 2022-06-01: qty 4
  Filled 2022-06-01 (×3): qty 1

## 2022-06-01 MED ORDER — ASPIRIN 325 MG PO TABS
325.0000 mg | ORAL_TABLET | Freq: Once | ORAL | Status: AC
  Administered 2022-06-01: 325 mg via ORAL
  Filled 2022-06-01: qty 1

## 2022-06-01 MED ORDER — CHLORHEXIDINE GLUCONATE CLOTH 2 % EX PADS
6.0000 | MEDICATED_PAD | Freq: Every day | CUTANEOUS | Status: DC
  Administered 2022-06-01: 6 via TOPICAL

## 2022-06-01 MED ORDER — SODIUM CHLORIDE 0.9 % IV SOLN
1.0000 g | INTRAVENOUS | Status: AC
  Administered 2022-06-01 – 2022-06-04 (×4): 1 g via INTRAVENOUS
  Filled 2022-06-01: qty 1
  Filled 2022-06-01: qty 10
  Filled 2022-06-01: qty 1
  Filled 2022-06-01: qty 10

## 2022-06-01 NOTE — Progress Notes (Signed)
[place] for Electrolyte Monitoring and Replacement   Recent Labs: Potassium (mmol/L)  Date Value  06/01/2022 3.3 (L)   Magnesium (mg/dL)  Date Value  06/01/2022 1.8   Calcium (mg/dL)  Date Value  06/01/2022 8.1 (L)   Albumin (g/dL)  Date Value  06/01/2022 2.7 (L)   Phosphorus (mg/dL)  Date Value  06/01/2022 3.2   Sodium (mmol/L)  Date Value  06/01/2022 121 (L)     Assessment: 62 y.o. female w/ PMH of HTN  and tobacco use who presented to the ED with chief complaints of shortness of breath, productive cough. Pharmacy is asked to follow and replace electrolytes while in CCU.   Goal of Therapy:  Electrolytes WNL  Plan:  ---10 mEq IV KCl x 2 ---BMP q6h per MD  Dallie Piles ,PharmD Clinical Pharmacist 06/01/2022 1:23 PM

## 2022-06-01 NOTE — Progress Notes (Signed)
0705-Entered room at shift change to check on patient prior to giving report and patient requested for her left arm to be repositioned and stated that she could not move or lift it. Assessed BL upper extremities and noted weakness in left grip and no effort against gravity in left upper arm. BL lower extremities noted with some effort against gravity.  6- Charge RN notified of patients c/o left arm weakness.  0725-Dr Kasa notified of patients c/o left arm weakness.

## 2022-06-01 NOTE — Plan of Care (Signed)
Patient Transferred to [facility] service this AM  ICU nurse notified me that patient has [place] prior to event did NOT reveal any deficits patient admitted with generalized weakness and hyponatremia  ICU nurse notified and message relayed to initiate [place]

## 2022-06-01 NOTE — Plan of Care (Signed)
Updated patient's brother at bedside.  MRI brain without stroke.  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  Echo pending.

## 2022-06-01 NOTE — Progress Notes (Signed)
CODE STROKE- PHARMACY COMMUNICATION   Time CODE STROKE called/page received: 0735  Time response to CODE STROKE was made in person immediately  Time Stroke Kit retrieved from [place] (only if needed): N/A - imaging indicated no stroke  Name of Provider/Nurse contacted: N/A  Past Medical History:  Diagnosis Date   Hypertension    Prior to Admission medications   Medication Sig Start Date End Date Taking? Authorizing Provider  fluticasone (FLONASE) 50 MCG/ACT nasal spray Place 1 spray into both nostrils 2 (two) times daily. 08/23/21  Yes Cuthriell, Charline Bills, PA-C  hydrochlorothiazide (HYDRODIURIL) 25 MG tablet Take 25 mg by mouth daily. 12/18/21  Yes [provider]  meclizine (ANTIVERT) 25 MG tablet Take 1 tablet (25 mg total) by mouth 3 (three) times daily as needed for dizziness. 08/23/21  Yes Cuthriell, Charline Bills, PA-C  PROVENTIL HFA 108 (90 Base) MCG/ACT inhaler Inhale 2 puffs into the lungs every 4 (four) hours as needed for shortness of breath or wheezing. 12/22/21  Yes [provider]  potassium chloride (KLOR-CON) 8 MEQ tablet Take by mouth. Patient not taking: Reported on 05/31/2022 12/29/21   [provider]  potassium chloride SA (KLOR-CON M) 20 MEQ tablet Take 1 tablet (20 mEq total) by mouth 2 (two) times daily for 5 days. 08/23/21 08/28/21  Cuthriell, Charline Bills, PA-C   Will M. Ouida Sills, PharmD PGY-1 Pharmacy Resident 06/01/2022 8:00 AM

## 2022-06-01 NOTE — Progress Notes (Signed)
PROGRESS NOTE  Katie Moody [id number] DOB: Apr 22, 1960   PCP: [facility] [place]  Patient is from: Home  DOA: 05/31/2022 LOS: 1  Chief complaints Chief Complaint  Patient presents with   Weakness     Brief Narrative / Interim history: 62 year old F with PMH of HTN and tobacco use disorder presenting with shortness of breath and productive cough for 5 days and generalized weakness for 2 days, dizzy nests and fall without trauma or loss of consciousness, and admitted to ICU with severe hyponatremia, hypokalemia, bibasilar pneumonia with mucous plugging and non-STEMI.   In ED, vital stable except for mild tachycardia. Na 110. K 2.8.  Lactic acid 3.0.  CXR without focal airspace opacity.  CTA chest negative for PE but opacification of the bilateral lower lobe posterior and lateral segmental airways concerning for mucous plugging.  CTA chest also showed scattered centrilobular and tree-in-bud airspace opacities within the bilateral lower lobes, medial segment of RML and anteromedial LUL.  Patient was started on IV NS fluid, IV Solu-Medrol, DuoNeb, antibiotic for CAP coverage and IV heparin for non-STEMI, and admitted to ICU.  Nephrology consulted for hyponatremia.   Patient's care transferred to [facility] hospitalist service on 2/26.  Code stroke activated the morning of 2/26 due to LUE weakness.  CT head without contrast without acute finding.  MRI brain and cervical spine ordered.   Subjective: Seen and examined earlier this morning.  Reports improvement in her cough.   Reports left arm weakness and numbness since admission.  She had a fall but denies trauma.  She denies headache or acute vision change.  His speech is clear.  Objective: Vitals:   06/01/22 0705 06/01/22 0756 06/01/22 0800 06/01/22 0900  BP:  100/62 (!) 87/59 97/60  Pulse: 84 81 87 76  Resp: (!) '24 15 16 15  '$ Temp:      TempSrc:      SpO2: 97% 98% 100% 98%  Weight:      Height:         Examination:  GENERAL: No apparent distress.  Nontoxic. HEENT: MMM.  Vision and hearing grossly intact.  NECK: Supple.  No apparent JVD.  RESP:  No IWOB.  Fair aeration bilaterally. CVS:  RRR. Heart sounds normal.  ABD/GI/GU: BS+. Abd soft, NTND.  MSK/EXT:  Moves extremities.  LUE weakness, 3+/5, LLE weakness, 4/5 SKIN: no apparent skin lesion or wound NEURO: Awake, alert and oriented appropriately.  PERRL.  No facial asymmetry.  Speech clear.  Motor 3+/5 in LUE, 4/5 in LLE and 5/5 elsewhere.  Light sensation grossly intact.  Reflexes symmetric. PSYCH: Calm. Normal affect.   Procedures:  None  Microbiology summarized: [id number], influenza and RSV PCR nonreactive Blood cultures NGTD MRSA PCR screen negative  Assessment and plan: Principal Problem:   Hyponatremia Active Problems:   CAP (community acquired pneumonia)   Acute respiratory failure with hypercapnia (HCC)   Non-STEMI (non-ST elevated myocardial infarction) ([place])   Left hemiparesis (HCC)   Tobacco use disorder   Physical deconditioning   Lactic acidosis   Bandemia   Severe sepsis (HCC)  Severe sepsis due to community-acquired pneumonia: POA.  Had leukocytosis, tachycardia with lactic acidosis and hypotension on presentation.  VBG suggests mild hypercapnia.  CTA chest as above.  MRSA PCR screen, blood culture, COVID-19, influenza and RSV PCR negative. -Wean oxygen as able -Continue ceftriaxone and Zithromax -Continue Solu-Medrol given risk for COPD -Continue bronchodilators, mucolytic's and antitussive.  Acute respiratory failure with hypercapnia: Due to COPD?Marland Kitchen  Has  no formal diagnosis this but significant smoking history. -Management as above.  Severe hyponatremia: In the setting of poor p.o. intake and thiazide diuretics. Recent Labs  Lab 05/31/22 1628 05/31/22 1629 05/31/22 2004 05/31/22 2109 05/31/22 2257 06/01/22 0458 06/01/22 0906  NA 110* 110* 113* 114* 114* 117* 121*  -Agree with stopping IV  fluid due to rapid correction -Continue monitoring sodium -Hold HCTZ. -Nephrology following.  NSTEMI?  Patient without chest pain.  EKG with nonspecific T wave changes in lateral leads.  Troponin peaked at 1582 and started trending down.  Unclear if this is non-STEMI or demand ischemia in the setting of sepsis and pneumonia.  UDS negative. -Echocardiogram, A1c and lipid panel -Continue IV heparin. -Cardiology consult  Left hemiparesis: Noted to have left arm weakness this morning.  Code stroke activated.  CT head without acute finding.  Patient reports left arm weakness since yesterday.  She had a fall at home. -Appreciate neuroinput-MRI brain and MRI cervical spine -Echocardiogram, A1c and lipid panel as above   Essential hypertension: Soft blood pressures. -Continue holding HCTZ.  Fall at home: -Fall precaution.  Tobacco use disorder -Encourage smoking cessation.  Lactic acidosis: Likely due to #1. -Resolved.  Hypokalemia: Likely due to thiazide diuretics.  Improved. -Monitor replenish as appropriate  Leukocytosis/bandemia: Likely due to #1 and steroid.  Improving. -Antibiotics as above -Continue monitoring   Underweight Body mass index is 16.53 kg/m. -Consult dietitian          DVT prophylaxis:  SCDs Start: 05/31/22 1805  Code Status: Full code Family Communication: None at bedside Level of care: Stepdown Status is: Inpatient Remains inpatient appropriate because: Due to severe sepsis, left hemiparesis and hyponatremia   Final disposition: TBD Consultants:  Pulmonary Neurology Cardiology  55 minutes with more than 50% spent in reviewing records, counseling patient/family and coordinating care.   Sch Meds:  Scheduled Meds:  Chlorhexidine Gluconate Cloth  6 each Topical Daily   Continuous Infusions:  sodium chloride Stopped (06/01/22 0734)   azithromycin     cefTRIAXone (ROCEPHIN)  IV     heparin 700 Units/hr (06/01/22 [id number])   potassium chloride  10 mEq (06/01/22 0953)   PRN Meds:.sodium chloride, acetaminophen, docusate sodium, ondansetron (ZOFRAN) IV, mouth rinse, polyethylene glycol  Antimicrobials: Anti-infectives (From admission, onward)    Start     Dose/Rate Route Frequency Ordered Stop   06/01/22 1700  cefTRIAXone (ROCEPHIN) 1 g in sodium chloride 0.9 % 100 mL IVPB        1 g 200 mL/hr over 30 Minutes Intravenous Every 24 hours 06/01/22 1025 06/05/22 1659   06/01/22 1700  azithromycin (ZITHROMAX) 500 mg in sodium chloride 0.9 % 250 mL IVPB        500 mg 250 mL/hr over 60 Minutes Intravenous Every 24 hours 06/01/22 1025 06/03/22 1659   05/31/22 1630  cefTRIAXone (ROCEPHIN) 1 g in sodium chloride 0.9 % 100 mL IVPB        1 g 200 mL/hr over 30 Minutes Intravenous  Once 05/31/22 1624 05/31/22 1713   05/31/22 1630  azithromycin (ZITHROMAX) 500 mg in sodium chloride 0.9 % 250 mL IVPB        500 mg 250 mL/hr over 60 Minutes Intravenous  Once 05/31/22 1624 05/31/22 1812        I have personally reviewed the following labs and images: CBC: Recent Labs  Lab 05/31/22 1456 06/01/22 0906  WBC 27.7* 19.7*  NEUTROABS  --  17.9*  HGB 14.0 11.5*  HCT RESULTS  UNAVAILABLE DUE TO INTERFERING SUBSTANCE 32.2*  MCV RESULTS UNAVAILABLE DUE TO INTERFERING SUBSTANCE 84.3  PLT 395 383   BMP &GFR Recent Labs  Lab 05/31/22 2004 05/31/22 2109 05/31/22 2257 06/01/22 0458 06/01/22 0906  NA 113* 114* 114* 117* 121*  K 3.0* 3.2* 3.5 3.4* 3.3*  CL 73* 76* 76* 80* 82*  CO2 '27 25 27 26 27  '$ GLUCOSE 95 97 97 85 77  BUN '8 9 9 9 9  '$ CREATININE 0.57 0.46 0.46 0.60 0.59  CALCIUM 7.9* 7.8* 7.8* 7.9* 8.1*  MG  --   --   --  1.8  --   PHOS  --   --   --  3.2  --    Estimated Creatinine Clearance: 56 mL/min (by C-G formula based on SCr of 0.59 mg/dL). Liver & Pancreas: Recent Labs  Lab 05/31/22 1628 06/01/22 0458  AST 29 25  ALT 14 14  ALKPHOS 67 60  BILITOT 1.3* 1.0  PROT 6.4* 5.6*  ALBUMIN 3.2* 2.7*   Recent Labs  Lab  05/31/22 2109  LIPASE 59*   No results for input(s): "AMMONIA" in the last 168 hours. Diabetic: No results for input(s): "HGBA1C" in the last 72 hours. Recent Labs  Lab 05/31/22 1927  GLUCAP 77   Cardiac Enzymes: No results for input(s): "CKTOTAL", "CKMB", "CKMBINDEX", "TROPONINI" in the last 168 hours. No results for input(s): "PROBNP" in the last 8760 hours. Coagulation Profile: Recent Labs  Lab 05/31/22 1458  INR 1.1   Thyroid Function Tests: Recent Labs    05/31/22 2109  TSH 0.184*  FREET4 1.23*   Lipid Profile: No results for input(s): "CHOL", "HDL", "LDLCALC", "TRIG", "CHOLHDL", "LDLDIRECT" in the last 72 hours. Anemia Panel: No results for input(s): "VITAMINB12", "FOLATE", "FERRITIN", "TIBC", "IRON", "RETICCTPCT" in the last 72 hours. Urine analysis:    Component Value Date/Time   COLORURINE YELLOW (A) 05/31/2022 2115   APPEARANCEUR CLEAR (A) 05/31/2022 2115   LABSPEC 1.043 (H) 05/31/2022 2115   PHURINE 7.0 05/31/2022 2115   GLUCOSEU NEGATIVE 05/31/2022 2115   HGBUR NEGATIVE 05/31/2022 2115   BILIRUBINUR NEGATIVE 05/31/2022 2115   KETONESUR 20 (A) 05/31/2022 2115   PROTEINUR NEGATIVE 05/31/2022 2115   NITRITE NEGATIVE 05/31/2022 2115   LEUKOCYTESUR NEGATIVE 05/31/2022 2115   Sepsis Labs: Invalid input(s): "PROCALCITONIN", "LACTICIDVEN"  Microbiology: Recent Results (from the past 240 hour(s))  Blood Culture (routine x 2)     Status: None (Preliminary result)   Collection Time: 05/31/22  2:58 PM   Specimen: BLOOD  Result Value Ref Range Status   Specimen Description BLOOD RAC  Final   Special Requests   Final    BOTTLES DRAWN AEROBIC AND ANAEROBIC Blood Culture results may not be optimal due to an inadequate volume of blood received in culture bottles   Culture   Final    NO GROWTH < 24 HOURS Performed at [facility], [address] [place], [place] [address]    Report Status PENDING  Incomplete  Blood Culture (routine x 2)     Status:  None (Preliminary result)   Collection Time: 05/31/22  3:03 PM   Specimen: BLOOD  Result Value Ref Range Status   Specimen Description BLOOD LAC  Final   Special Requests   Final    BOTTLES DRAWN AEROBIC AND ANAEROBIC Blood Culture adequate volume   Culture   Final    NO GROWTH < 24 HOURS Performed at [facility], [address] [place],  [address]  Report Status PENDING  Incomplete  Resp panel by RT-PCR (RSV, Flu A&B, Covid) Anterior Nasal Swab     Status: None   Collection Time: 05/31/22  4:28 PM   Specimen: Anterior Nasal Swab  Result Value Ref Range Status   SARS Coronavirus 2 by RT PCR NEGATIVE NEGATIVE Final    Comment: (NOTE) SARS-CoV-2 target nucleic acids are NOT DETECTED.  The SARS-CoV-2 RNA is generally detectable in upper respiratory specimens during the acute phase of infection. The lowest concentration of SARS-CoV-2 viral copies this assay can detect is 138 copies/mL. A negative result does not preclude SARS-Cov-2 infection and should not be used as the sole basis for treatment or other patient management decisions. A negative result may occur with  improper specimen collection/handling, submission of specimen other than nasopharyngeal swab, presence of viral mutation(s) within the areas targeted by this assay, and inadequate number of viral copies(<138 copies/mL). A negative result must be combined with clinical observations, patient history, and epidemiological information. The expected result is Negative.  Fact Sheet for Patients:  EntrepreneurPulse.com.au  Fact Sheet for Healthcare Providers:  IncredibleEmployment.be  This test is no t yet approved or cleared by the [place] FDA and  has been authorized for detection and/or diagnosis of SARS-CoV-2 by FDA under an Emergency Use Authorization (EUA). This EUA will remain  in effect (meaning this test can be used) for the duration of the COVID-19  declaration under Section 564(b)(1) of the Act, 21 U.S.C.section 360bbb-3(b)(1), unless the authorization is terminated  or revoked sooner.       Influenza A by PCR NEGATIVE NEGATIVE Final   Influenza B by PCR NEGATIVE NEGATIVE Final    Comment: (NOTE) The Xpert Xpress SARS-CoV-2/FLU/RSV plus assay is intended as an aid in the diagnosis of influenza from Nasopharyngeal swab specimens and should not be used as a sole basis for treatment. Nasal washings and aspirates are unacceptable for Xpert Xpress SARS-CoV-2/FLU/RSV testing.  Fact Sheet for Patients: EntrepreneurPulse.com.au  Fact Sheet for Healthcare Providers: IncredibleEmployment.be  This test is not yet approved or cleared by the [place] FDA and has been authorized for detection and/or diagnosis of SARS-CoV-2 by FDA under an Emergency Use Authorization (EUA). This EUA will remain in effect (meaning this test can be used) for the duration of the COVID-19 declaration under Section 564(b)(1) of the Act, 21 U.S.C. section 360bbb-3(b)(1), unless the authorization is terminated or revoked.     Resp Syncytial Virus by PCR NEGATIVE NEGATIVE Final    Comment: (NOTE) Fact Sheet for Patients: EntrepreneurPulse.com.au  Fact Sheet for Healthcare Providers: IncredibleEmployment.be  This test is not yet approved or cleared by the [place] FDA and has been authorized for detection and/or diagnosis of SARS-CoV-2 by FDA under an Emergency Use Authorization (EUA). This EUA will remain in effect (meaning this test can be used) for the duration of the COVID-19 declaration under Section 564(b)(1) of the Act, 21 U.S.C. section 360bbb-3(b)(1), unless the authorization is terminated or revoked.  Performed at [facility], [address] [place], [place] [address]   MRSA Next Gen by PCR, Nasal     Status: None   Collection Time: 05/31/22  9:11 PM    Specimen: Nasal Mucosa; Nasal Swab  Result Value Ref Range Status   MRSA by PCR Next Gen NOT DETECTED NOT DETECTED Final    Comment: (NOTE) The GeneXpert MRSA Assay (FDA approved for NASAL specimens only), is one component of a comprehensive MRSA colonization surveillance program. It is not intended  to diagnose MRSA infection nor to guide or monitor treatment for MRSA infections. Test performance is not FDA approved in patients less than 94 years old. Performed at [facility], [address] [place], [place] [address]     Radiology Studies: CT HEAD CODE STROKE WO CONTRAST`  Addendum Date: 06/01/2022   ADDENDUM REPORT: 06/01/2022 08:16 ADDENDUM: Study discussed by telephone with NP Donell Beers on 06/01/2022 at 0757 hours. Electronically Signed   By: Genevie Ann M.D.   On: 06/01/2022 08:16   Result Date: 06/01/2022 CLINICAL DATA:  Code stroke. 62 year old female new left upper extremity weakness. EXAM: CT HEAD WITHOUT CONTRAST TECHNIQUE: Contiguous axial images were obtained from the base of the skull through the vertex without intravenous contrast. RADIATION DOSE REDUCTION: This exam was performed according to the departmental dose-optimization program which includes automated exposure control, adjustment of the mA and/or kV according to patient size and/or use of iterative reconstruction technique. COMPARISON:  None Available. FINDINGS: Brain: Cerebral volume is within normal limits for age. No midline shift, ventriculomegaly, mass effect, evidence of mass lesion, intracranial hemorrhage or evidence of cortically based acute infarction. Gray-white matter differentiation within normal limits the for age throughout the brain. Vascular: Calcified atherosclerosis at the skull base. No suspicious intracranial vascular hyperdensity. Skull: No acute osseous abnormality identified. Sinuses/Orbits: Mild to moderate left mastoid effusion. Left tympanic cavity appears to remain clear. Grossly  negative visible nasopharynx. Other Visualized paranasal sinuses and mastoids are clear. Other: Visualized orbits and scalp soft tissues are within normal limits. ASPECTS [facility] Stroke Program Early CT Score) Total score (0-10 with 10 being normal): 10 IMPRESSION: 1. Normal for age non contrast CT appearance of the brain. ASPECTS 10. 2. Left mastoid effusion, likely postinflammatory and significance doubtful. Electronically Signed: By: Genevie Ann M.D. On: 06/01/2022 07:53   CT Angio Chest PE W/Cm &/Or Wo Cm  Result Date: 05/31/2022 CLINICAL DATA:  Pulmonary embolism suspected. High probability. Near syncopal episode. Difficulty breathing. EXAM: CT ANGIOGRAPHY CHEST WITH CONTRAST TECHNIQUE: Multidetector CT imaging of the chest was performed using the standard protocol during bolus administration of intravenous contrast. Multiplanar CT image reconstructions and MIPs were obtained to evaluate the vascular anatomy. RADIATION DOSE REDUCTION: This exam was performed according to the departmental dose-optimization program which includes automated exposure control, adjustment of the mA and/or kV according to patient size and/or use of iterative reconstruction technique. CONTRAST:  10m OMNIPAQUE IOHEXOL 350 MG/ML SOLN COMPARISON:  AP chest 05/31/2022, chest two views 08/23/2021 FINDINGS: Cardiovascular: The main pulmonary artery is opacified up to 905 Hounsfield units. No filling defect is seen to indicate a pulmonary embolism. Heart size is normal. No pericardial effusion. No thoracic aortic aneurysm. Moderate atherosclerotic calcifications within the thoracic aorta. Mediastinum/Nodes: No axillary, mediastinal, or hilar pathologically enlarged lymph nodes I CT criteria. The visualized thyroid is unremarkable. Probable tiny sliding hiatal hernia. Lungs/Pleura: The central airways are patent. There is opacification of the bilateral lower lobe posterior and lateral segmental airways fairly symmetrically, possibly from  mucous plugging (axial series 9 images 99 through 137). There is associated bilateral lower lobe posterior and lateral segmental mild bronchial wall thickening and mild bronchiectasis. There are scattered centrilobular and tree-in-bud airspace opacities within the bilateral lower lobes, greatest at the far inferior left-greater-than-right aspects. This suggests airways disseminated infection, possibly chronic, and possibly related to aspiration. There is also similar opacification of the medial segment of the right middle lobe airway with distal curvilinear and centrilobular/tree-in-bud opacities (axial series 9, images 109 through  129). Mild similar curvilinear and centrilobular nodular densities within the anteromedial left upper lobe (axial series 9, image 76). No pleural effusion or pneumothorax. Upper Abdomen: There is reflux of contrast into the inferior vena cava and hepatic veins, possibly from right heart insufficiency. No acute abnormality is seen within the upper abdomen. Musculoskeletal: Moderate multilevel degenerative disc changes of the midthoracic spine. Review of the MIP images confirms the above findings. IMPRESSION: 1. No pulmonary embolism is seen. 2. There is opacification of the bilateral lower lobe posterior and lateral segmental airways, possibly from mucous plugging. There is associated bilateral lower lobe posterior and lateral segmental mild bronchial wall thickening and mild bronchiectasis. 3. There are scattered centrilobular and tree-in-bud airspace opacities within the bilateral lower lobes, medial segment of the right middle lobe, and anteromedial left upper lobe. This suggests mild multifocal airways disseminated infection, possibly chronic, and possibly related to aspiration. 4. There is reflux of contrast into the inferior vena cava and hepatic veins, possibly from right heart insufficiency. Electronically Signed   By: Yvonne Kendall M.D.   On: 05/31/2022 18:47   DG Chest Port 1  View  Result Date: 05/31/2022 CLINICAL DATA:  Sepsis EXAM: PORTABLE CHEST 1 VIEW COMPARISON:  08/23/21 CXR FINDINGS: No pleural effusion. No pneumothorax. No focal airspace opacity. Normal cardiac and mediastinal contours. No radiographically apparent displaced fracture. Visualized upper abdomen is notable for colonic gaseous distention IMPRESSION: 1. No focal airspace opacity. 2. Visualized upper abdomen is notable for colonic gaseous distention. If there is clinical concern for abdominal pathology, further evaluation with a dedicated abdominal radiograph is recommended. Electronically Signed   By: Marin Roberts M.D.   On: 05/31/2022 14:23      Heena Woodbury T. [place]  If 7PM-7AM, please contact night-coverage [email or web address] 06/01/2022, 11:45 AM

## 2022-06-01 NOTE — Progress Notes (Signed)
[facility] for Electrolyte Monitoring and Replacement   Recent Labs: Potassium (mmol/L)  Date Value  06/01/2022 3.4 (L)   Magnesium (mg/dL)  Date Value  06/01/2022 1.8   Calcium (mg/dL)  Date Value  06/01/2022 7.9 (L)   Albumin (g/dL)  Date Value  06/01/2022 2.7 (L)   Phosphorus (mg/dL)  Date Value  06/01/2022 3.2   Sodium (mmol/L)  Date Value  06/01/2022 117 (LL)     Assessment: 62 y.o. female w/ PMH of HTN  and tobacco use who presented to the ED with chief complaints of shortness of breath, productive cough. Pharmacy is asked to follow and replace electrolytes while in CCU.   Goal of Therapy:  Electrolytes WNL  Plan:  ---10 mEq IV KCl x 3 ---2 grams IV magnesium sulfate x 1 ---BMP q6h per MD  Dallie Piles ,PharmD Clinical Pharmacist 06/01/2022 7:40 AM

## 2022-06-01 NOTE — Consult Note (Signed)
ANTICOAGULATION CONSULT NOTE  Pharmacy Consult for heparin Indication: ACS/STEMI  No Known Allergies  Patient Measurements: Height: '5\' 8"'$  (172.7 cm) Weight: 49.3 kg (108 lb 11 oz) IBW/kg (Calculated) : 63.9 Heparin Dosing Weight: 47.6 kg  Vital Signs: Temp: 97.5 F (36.4 C) (02/26 0100) Temp Source: Oral (02/26 0100) BP: 85/58 (02/26 0200) Pulse Rate: 80 (02/26 0200)  Labs: Recent Labs    05/31/22 1456 05/31/22 1458 05/31/22 1628 05/31/22 1628 05/31/22 1629 05/31/22 2004 05/31/22 2109 05/31/22 2257 06/01/22 0120  HGB 14.0  --   --   --   --   --   --   --   --   HCT RESULTS UNAVAILABLE DUE TO INTERFERING SUBSTANCE  --   --   --   --   --   --   --   --   PLT 395  --   --   --   --   --   --   --   --   APTT  --  29  --   --   --   --   --   --   --   LABPROT  --  13.9  --   --   --   --   --   --   --   INR  --  1.1  --   --   --   --   --   --   --   HEPARINUNFRC  --   --   --   --   --   --  <0.10*  --  <0.10*  CREATININE  --   --  0.68   < > 0.64 0.57 0.46 0.46  --   TROPONINIHS 935*  --  1,582*  --  1,011*  --   --   --   --    < > = values in this interval not displayed.     Estimated Creatinine Clearance: 56 mL/min (by C-G formula based on SCr of 0.46 mg/dL).   Medical History: Past Medical History:  Diagnosis Date   Hypertension     Medications:  No evidence of PTA anticoagulation  Assessment: Pharmacy consulted to dose heparin in this 62 yo F who presents to the ED with SOB/chest pain.  Troponin 935.  CrCl 54.1  Baseline Labs: aPTT 29, INR 1.1, CBC in process   Goal of Therapy:  Heparin level 0.3-0.7 units/ml Monitor platelets by anticoagulation protocol: Yes   2/26 0120 HL < 0.1, subtherapeutic  Plan:  Bolus 1400 units x 1 Increase heparin infusion to 700 units/hr Recheck HL in 6 hr after rate change CBC daily while on heparin  Renda Rolls, PharmD, [facility] 06/01/2022 2:14 AM

## 2022-06-01 NOTE — Progress Notes (Incomplete Revision)
[facility] for Electrolyte Monitoring and Replacement   Recent Labs: Potassium (mmol/L)  Date Value  06/01/2022 3.3 (L)   Magnesium (mg/dL)  Date Value  06/01/2022 1.8   Calcium (mg/dL)  Date Value  06/01/2022 8.1 (L)   Albumin (g/dL)  Date Value  06/01/2022 2.7 (L)   Phosphorus (mg/dL)  Date Value  06/01/2022 3.2   Sodium (mmol/L)  Date Value  06/01/2022 121 (L)     Assessment: 62 y.o. female w/ PMH of HTN  and tobacco use who presented to the ED with chief complaints of shortness of breath, productive cough. Pharmacy is asked to follow and replace electrolytes while in CCU.   Goal of Therapy:  Electrolytes WNL  Plan:  ---10 mEq IV KCl x 2 ---BMP q6h per MD  Dallie Piles ,PharmD Clinical Pharmacist 06/01/2022 1:23 PM

## 2022-06-01 NOTE — Evaluation (Signed)
Occupational Therapy Evaluation Patient Details Name: Katie Moody MRN: [id number] DOB: May 11, 1960 Today's Date: 06/01/2022   History of Present Illness 62 year old F with PMH of HTN and tobacco use disorder presenting with shortness of breath and productive cough for 5 days and generalized weakness for 2 days, dizzy nests and fall without trauma or loss of consciousness, and admitted to ICU with severe hyponatremia, hypokalemia, bibasilar pneumonia with mucous plugging and non-STEMI.   Clinical Impression   Patient presenting with decreased Ind in self care,balance, functional mobility/transfers, endurance, and safety awareness. Patient reports living at home with sister and being a retired Armed forces logistics/support/administrative officer. Pt endorses being Ind at baseline without use of AD and driving.Patient endorses having L sided weakness and being unable to move or utilize L UE and LE. Pt's formal manual muscle testing and functional mobility observed during the session showed varying results. For example, pt unable to squeeze therapist hand or lift L UE against gravity but grabs onto bed rail when sitting up and lifts entire arm up for me to place pillow under it at end of session. Same for L LE as pt moves it on and off of bed without assistance. Pt stands with min A and takes several side steps with min guard. Pt's [place] turned off during session but left on face with O2 saturation being 95% or better with all tasks and left on  RA. RN notified of findings this session. Patient will benefit from acute OT to increase overall independence in the areas of ADLs, functional mobility, and safety awareness in order to safely discharge home with family.      Recommendations for follow up therapy are one component of a multi-disciplinary discharge planning process, led by the attending physician.  Recommendations may be updated based on patient status, additional functional criteria and insurance authorization.   Follow Up  Recommendations  Home health OT     Assistance Recommended at Discharge Intermittent Supervision/Assistance  Patient can return home with the following A little help with walking and/or transfers;A little help with bathing/dressing/bathroom;Help with stairs or ramp for entrance;Assist for transportation;Assistance with cooking/housework    Functional Status Assessment  Patient has had a recent decline in their functional status and demonstrates the ability to make significant improvements in function in a reasonable and predictable amount of time.  Equipment Recommendations  Other (comment) (recommend use of RW at home)       Precautions / Restrictions Precautions Precautions: Fall      Mobility Bed Mobility Overal bed mobility: Needs Assistance Bed Mobility: Supine to Sit, Sit to Supine     Supine to sit: Min guard Sit to supine: Min guard   General bed mobility comments: with min - mod cuing for technique    Transfers Overall transfer level: Needs assistance   Transfers: Sit to/from Stand Sit to Stand: Min guard, Min assist           General transfer comment: lateral side steps with min guard      Balance Overall balance assessment: Needs assistance Sitting-balance support: Feet supported Sitting balance-Leahy Scale: Good     Standing balance support: During functional activity, Single extremity supported Standing balance-Leahy Scale: Fair                             ADL either performed or assessed with clinical judgement   ADL Overall ADL's : Needs assistance/impaired  General ADL Comments: min A overall for LB self care and functional transfers without use of AD     Vision Patient Visual Report: No change from baseline              Pertinent Vitals/Pain Pain Assessment Pain Assessment: No/denies pain     Hand Dominance Right   Extremity/Trunk Assessment Upper Extremity  Assessment Upper Extremity Assessment: Generalized weakness (Pt endorses L UE weakness during manual muscle testing but functionally displays the ability to utilize during session)   Lower Extremity Assessment Lower Extremity Assessment: Generalized weakness       Communication Communication Communication: No difficulties   Cognition Arousal/Alertness: Awake/alert Behavior During Therapy: WFL for tasks assessed/performed Overall Cognitive Status: No family/caregiver present to determine baseline cognitive functioning                                 General Comments: Pt answers orientation questions correctly but does appear to have some mild confusion and will correct herself often.                Home Living Family/patient expects to be discharged to:: Private residence Living Arrangements: Other relatives;Other (Comment) (sister) Available Help at Discharge: Family Type of Home: House Home Access: Stairs to enter [employer] of Steps: 2 Entrance Stairs-Rails: Left Home Layout: One level     Bathroom Shower/Tub: Teacher, early years/pre: Standard     Home Equipment: Conservation officer, nature (2 wheels);Cane - single point          Prior Functioning/Environment Prior Level of Function : Independent/Modified Independent;Driving;History of Falls (last six months)               ADLs Comments: Pt reports being Ind at baseline without use of AD and her and sister share IADL tasks        OT Problem List: Decreased strength;Decreased range of motion;Decreased activity tolerance;Decreased safety awareness;Impaired balance (sitting and/or standing);Decreased knowledge of use of DME or AE      OT Treatment/Interventions: Self-care/ADL training;Therapeutic exercise;Therapeutic activities;Energy conservation;Balance training;Manual therapy;Patient/family education    OT Goals(Current goals can be found in the care plan section) Acute Rehab OT  Goals Patient Stated Goal: to feel better OT Goal Formulation: With patient Time For Goal Achievement: 06/15/22 Potential to Achieve Goals: Good ADL Goals Pt Will Perform Grooming: with modified independence;standing Pt Will Perform Lower Body Dressing: with modified independence Pt Will Transfer to Toilet: with modified independence Pt Will Perform Toileting - Clothing Manipulation and hygiene: with modified independence  OT Frequency: Min 2X/week       AM-PAC OT "6 Clicks" Daily Activity     Outcome Measure Help from another person eating meals?: None Help from another person taking care of personal grooming?: None Help from another person toileting, which includes using toliet, bedpan, or urinal?: A Little Help from another person bathing (including washing, rinsing, drying)?: A Little Help from another person to put on and taking off regular upper body clothing?: None Help from another person to put on and taking off regular lower body clothing?: A Little 6 Click Score: 21   End of Session Equipment Utilized During Treatment: Rolling walker (2 wheels) Nurse Communication: Mobility status  Activity Tolerance: Patient tolerated treatment well Patient left: in bed;with call bell/phone within reach;with bed alarm set  OT Visit Diagnosis: Unsteadiness on feet (R26.81);Repeated falls (R29.6);Muscle weakness (generalized) (M62.81)  Time: [id number] OT Time Calculation (min): 21 min Charges:  OT General Charges $OT Visit: 1 Visit OT Evaluation $OT Eval Moderate Complexity: 1 Mod OT Treatments $Therapeutic Activity: 8-22 mins  Darleen Crocker, MS, OTR/L , CBIS ascom [phone]  06/01/22, 3:40 PM

## 2022-06-01 NOTE — Consult Note (Signed)
ANTICOAGULATION CONSULT NOTE  Pharmacy Consult for heparin Indication: ACS/STEMI  No Known Allergies  Patient Measurements: Height: '5\' 8"'$  (172.7 cm) Weight: 49.3 kg (108 lb 11 oz) IBW/kg (Calculated) : 63.9 Heparin Dosing Weight: 47.6 kg  Vital Signs: Temp: 97.5 F (36.4 C) (02/26 0100) Temp Source: Oral (02/26 0100) BP: 84/54 (02/26 0700) Pulse Rate: 92 (02/26 0700)  Labs: Recent Labs    05/31/22 1456 05/31/22 1458 05/31/22 1628 05/31/22 1628 05/31/22 1629 05/31/22 2004 05/31/22 2109 05/31/22 2257 06/01/22 0120 06/01/22 0458  HGB 14.0  --   --   --   --   --   --   --   --   --   HCT RESULTS UNAVAILABLE DUE TO INTERFERING SUBSTANCE  --   --   --   --   --   --   --   --   --   PLT 395  --   --   --   --   --   --   --   --   --   APTT  --  29  --   --   --   --   --   --   --   --   LABPROT  --  13.9  --   --   --   --   --   --   --   --   INR  --  1.1  --   --   --   --   --   --   --   --   HEPARINUNFRC  --   --   --   --   --   --  <0.10*  --  <0.10*  --   CREATININE  --   --  0.68   < > 0.64   < > 0.46 0.46  --  0.60  TROPONINIHS 935*  --  1,582*  --  1,011*  --   --   --   --   --    < > = values in this interval not displayed.     Estimated Creatinine Clearance: 56 mL/min (by C-G formula based on SCr of 0.6 mg/dL).   Medical History: Past Medical History:  Diagnosis Date   Hypertension     Medications:  No evidence of PTA anticoagulation  Assessment: Pharmacy consulted to dose heparin in this 62 yo F who presents to the ED with SOB/chest pain.  Troponin 935.  Baseline Labs: aPTT 29, INR 1.1  Goal of Therapy:  Heparin level 0.3-0.7 units/ml Monitor platelets by anticoagulation protocol: Yes  Plan: heparin was stopped for CT/MRI today ---restarted heparin infusion at 700 units/hr ---Recheck heparin level in 6 hr after restart ---CBC daily while on heparin  Vallery Sa, PharmD, BCPS 06/01/2022 7:45 AM

## 2022-06-01 NOTE — Consult Note (Signed)
ANTICOAGULATION CONSULT NOTE  Pharmacy Consult for heparin Indication: ACS/STEMI  No Known Allergies  Patient Measurements: Height: '5\' 8"'$  (172.7 cm) Weight: 49.3 kg (108 lb 11 oz) IBW/kg (Calculated) : 63.9 Heparin Dosing Weight: 47.6 kg  Vital Signs: Temp: 98.4 F (36.9 C) (02/26 1843) Temp Source: Oral (02/26 1843) BP: 102/63 (02/26 1843) Pulse Rate: 100 (02/26 1843)  Labs: Recent Labs    05/31/22 1456 05/31/22 1458 05/31/22 1628 05/31/22 1628 05/31/22 1629 05/31/22 2004 06/01/22 0120 06/01/22 0458 06/01/22 0906 06/01/22 1751  HGB 14.0  --   --   --   --   --   --   --  11.5*  --   HCT RESULTS UNAVAILABLE DUE TO INTERFERING SUBSTANCE  --   --   --   --   --   --   --  32.2*  --   PLT 395  --   --   --   --   --   --   --  383  --   APTT  --  29  --   --   --   --   --   --   --   --   LABPROT  --  13.9  --   --   --   --   --   --   --   --   INR  --  1.1  --   --   --   --   --   --   --   --   HEPARINUNFRC  --   --   --   --   --    < > <0.10*  --  0.10* <0.10*  CREATININE  --   --  0.68   < > 0.64   < >  --  0.60 0.59 0.55  TROPONINIHS 935*  --  1,582*  --  1,011*  --   --   --   --   --    < > = values in this interval not displayed.     Estimated Creatinine Clearance: 56 mL/min (by C-G formula based on SCr of 0.55 mg/dL).   Medical History: Past Medical History:  Diagnosis Date   Hypertension     Medications:  No evidence of PTA anticoagulation  Assessment: Pharmacy consulted to dose heparin in this 62 yo F who presents to the ED with SOB/chest pain.  Troponin 935.  Baseline Labs: aPTT 29, INR 1.1  2/26 1750 HL < 0.10 Subtherapeutic   Goal of Therapy:  Heparin level 0.3-0.7 units/ml Monitor platelets by anticoagulation protocol: Yes  Plan:  heparin subtherapeutic   Give heparin bolus 1400 units x 1  Increase heparin infusion to 900 units/hr Recheck heparin level in 6 hr following rate change CBC daily while on heparin  Dorothe Pea, PharmD, BCPS Clinical Pharmacist   06/01/2022 7:09 PM

## 2022-06-01 NOTE — Progress Notes (Signed)
*  PRELIMINARY RESULTS* Echocardiogram 2D Echocardiogram has been performed.  Katie Moody 06/01/2022, 1:07 PM

## 2022-06-01 NOTE — Progress Notes (Signed)
Last known well time was previous assessment at 0100. Patient had equal grips BL with no c/o pain at that time.

## 2022-06-01 NOTE — Progress Notes (Signed)
   06/01/22 0900  Spiritual Encounters  Type of Visit Initial  Care provided to: Patient  Referral source Chaplain team  Reason for visit Code  OnCall Visit Yes   Chaplain responded to code page. Chaplain provided compassionate presence and reflective listening as patient spoke about health challenges. Chaplain also provided education regarding advance directives and Chaplain services. Patient appreciated Dalton visit.

## 2022-06-01 NOTE — Evaluation (Signed)
Physical Therapy Evaluation Patient Details Name: Katie Moody MRN: [id number] DOB: 1960-09-29 Today's Date: 06/01/2022  History of Present Illness  Pt is a 62 year old F with PMH of HTN and tobacco use disorder presenting with shortness of breath and productive cough for 5 days and generalized weakness for 2 days, dizzy and fall without trauma or loss of consciousness. MD assessment includes: Acute respiratory failure with hypercapnia, severe hyponatremia, NSTEMI, hypokalemia, noted LUE weakness with MRI negative for stroke, and lactic acidosis.   Clinical Impression  Pt  required education on benefits of activity and encouragement to participate during the session.  Pt required min physical assistance with bed mobility tasks and transfers and once in standing was only able to take a max of 2-3 very small, shuffling steps at the EOB before needing to return to sitting.  Pt found on room air with SpO2 90-91% at rest but dropped to 87% with bed mobility tasks.  Pt returned to 2LO2/min for remainder of session with nursing aware and requesting pt to stay on 2LO2/min at end of session.  Pt presents with a significant decrease in functional strength and activity tolerance compared to her stated baseline and is at an elevated risk for falls and further functional decline.  Pt will benefit from PT services in a SNF setting upon discharge to safely address deficits listed in patient problem list for decreased caregiver assistance and eventual return to PLOF.       Recommendations for follow up therapy are one component of a multi-disciplinary discharge planning process, led by the attending physician.  Recommendations may be updated based on patient status, additional functional criteria and insurance authorization.  Follow Up Recommendations Skilled nursing-short term rehab (<3 hours/day) Can patient physically be transported by private vehicle: No    Assistance Recommended at Discharge Frequent or  constant Supervision/Assistance  Patient can return home with the following  A lot of help with walking and/or transfers;A lot of help with bathing/dressing/bathroom;Assistance with cooking/housework;Assist for transportation;Help with stairs or ramp for entrance    Equipment Recommendations None recommended by PT  Recommendations for Other Services       Functional Status Assessment Patient has had a recent decline in their functional status and demonstrates the ability to make significant improvements in function in a reasonable and predictable amount of time.     Precautions / Restrictions Precautions Precautions: Fall Restrictions Weight Bearing Restrictions: No      Mobility  Bed Mobility Overal bed mobility: Needs Assistance Bed Mobility: Supine to Sit, Sit to Supine     Supine to sit: Min assist Sit to supine: Supervision   General bed mobility comments: Min A for trunk and BLE control during sup to sit    Transfers Overall transfer level: Needs assistance Equipment used: Rolling walker (2 wheels) Transfers: Sit to/from Stand Sit to Stand: Min assist           General transfer comment: Min verbal cues for sequencing and min A to come to full upright position    Ambulation/Gait Ambulation/Gait assistance: Min guard Gait Distance (Feet): 2 Feet Assistive device: Rolling walker (2 wheels) Gait Pattern/deviations: Step-to pattern, Trunk flexed, Decreased step length - left, Decreased step length - right Gait velocity: decreased     General Gait Details: Pt able to take a max of 2-3 very small, shuffling steps at the EOB before fatiguing and needing to return to sitting  Science writer  Modified Rankin (Stroke Patients Only)       Balance Overall balance assessment: Needs assistance Sitting-balance support: Feet supported Sitting balance-Leahy Scale: Good     Standing balance support: During functional activity,  Bilateral upper extremity supported Standing balance-Leahy Scale: Fair                               Pertinent Vitals/Pain Pain Assessment Pain Assessment: No/denies pain    Home Living Family/patient expects to be discharged to:: Private residence Living Arrangements: Other relatives;Other (Comment) (sister) Available Help at Discharge: Family;Available 24 hours/day Type of Home: House Home Access: Stairs to enter Entrance Stairs-Rails: Left Entrance Stairs-Number of Steps: 2   Home Layout: One level Home Equipment: Conservation officer, nature (2 wheels);Cane - single point;Shower seat      Prior Function Prior Level of Function : Independent/Modified Independent;Driving;History of Falls (last six months)             Mobility Comments: Ind amb community distances without an AD, no other fall history other than current fall associated with this admission ADLs Comments: Ind with ADLs     Hand Dominance   Dominant Hand: Right    Extremity/Trunk Assessment   Upper Extremity Assessment Upper Extremity Assessment: Defer to OT evaluation    Lower Extremity Assessment Lower Extremity Assessment: Generalized weakness       Communication   Communication: No difficulties  Cognition Arousal/Alertness: Awake/alert Behavior During Therapy: WFL for tasks assessed/performed Overall Cognitive Status: Within Functional Limits for tasks assessed                                          General Comments      Exercises Total Joint Exercises Ankle Circles/Pumps: AROM, Strengthening, Both, 10 reps Quad Sets: Strengthening, Both, 10 reps Hip ABduction/ADduction: AROM, Strengthening, Both, 5 reps Straight Leg Raises: AROM, Strengthening, Both, 5 reps Long Arc Quad: AROM, Strengthening, Both, 10 reps Knee Flexion: AROM, Strengthening, Both, 10 reps Other Exercises Other Exercises: HEP education for BLE APs, QS, GS, and LAQs x 10 each 5-6x/day Pt education  on physiological benefits of activity    Assessment/Plan    PT Assessment Patient needs continued PT services  PT Problem List Decreased strength;Decreased activity tolerance;Decreased balance;Decreased mobility;Decreased knowledge of use of DME       PT Treatment Interventions DME instruction;Gait training;Stair training;Functional mobility training;Therapeutic activities;Therapeutic exercise;Balance training;Patient/family education    PT Goals (Current goals can be found in the Care Plan section)  Acute Rehab PT Goals Patient Stated Goal: To get my strength back PT Goal Formulation: With patient Time For Goal Achievement: 06/14/22 Potential to Achieve Goals: Good    Frequency Min 2X/week     Co-evaluation               AM-PAC PT "6 Clicks" Mobility  Outcome Measure Help needed turning from your back to your side while in a flat bed without using bedrails?: A Little Help needed moving from lying on your back to sitting on the side of a flat bed without using bedrails?: A Little Help needed moving to and from a bed to a chair (including a wheelchair)?: A Little Help needed standing up from a chair using your arms (e.g., wheelchair or bedside chair)?: A Little Help needed to walk in hospital room?: A Lot Help needed climbing 3-5  steps with a railing? : Total 6 Click Score: 15    End of Session Equipment Utilized During Treatment: Gait belt;Oxygen Activity Tolerance: Patient tolerated treatment well Patient left: in bed;with call bell/phone within reach;with bed alarm set Nurse Communication: Mobility status;Other (comment) (SpO2 results per above) PT Visit Diagnosis: Unsteadiness on feet (R26.81);Difficulty in walking, not elsewhere classified (R26.2);Muscle weakness (generalized) (M62.81)    Time: [id number] PT Time Calculation (min) (ACUTE ONLY): 29 min   Charges:   PT Evaluation $PT Eval Moderate Complexity: 1 Mod PT Treatments $Therapeutic Exercise: 8-22  mins      D. Scott Braydee Shimkus PT, DPT 06/01/22, 4:21 PM

## 2022-06-01 NOTE — Consult Note (Addendum)
Cardiology Consultation:   Patient ID: Katie Moody; [id number]; Jun 30, 1960   Admit date: 05/31/2022 Date of Consult: 06/01/2022  Primary Care Provider: [facility] Primary Cardiologist: new consult by Fletcher Anon Primary Electrophysiologist:  None   Patient Profile:   Katie Moody is a 62 y.o. female with a hx of HTN and tobacco use who is being seen today for the evaluation of elevated troponin at the request of Dr. Cyndia Skeeters.  History of Present Illness:   Katie Moody has no previously known cardiac history.  She was admitted to the hospital on 05/31/2022 with a 1 week history of shortness of breath, productive cough, generalized weakness, dizziness, and fall without LOC.  She was without symptoms of chest pain.  Upon her arrival to the ED, BP was stable with a heart rate of 103 bpm.  Oxygen saturation 91% on 3 L.  Afebrile.  She was noted to have multiple laboratory derangements including a sodium of 110, potassium 2.8, chloride less than 65, calcium 8.4, albumin 2.7, high-sensitivity troponin 935 with a delta and peak of 1582, lactic acid 3.0 trending to 2.7, respiratory panel negative.  Laboratory derangements have subsequently improved with a sodium of 121, potassium 3.3, chloride 82.  Initial chest x-ray without focal airspace opacity with colonic gaseous distention noted in the upper abdomen.  CTA chest was negative for PE with findings consistent with mucous plugging and pneumonia.  On the morning of 2/26, the patient reported inability to move her left upper extremity with code stroke being called.  Stat head CT showed no acute intracranial process.  MRI of the brain showed no acute intracranial abnormality.  With regards to her elevated troponin, echo was performed and is pending.  The patient has been maintained on a heparin drip.  Patient is currently without symptoms of chest pain or cardiac decompensation.  She does report some shortness of breath after just  working with PT.    Past Medical History:  Diagnosis Date   Hypertension     History reviewed. No pertinent surgical history.   Home Meds: Prior to Admission medications   Medication Sig Start Date End Date Taking? Authorizing Provider  fluticasone (FLONASE) 50 MCG/ACT nasal spray Place 1 spray into both nostrils 2 (two) times daily. 08/23/21  Yes Cuthriell, Charline Bills, PA-C  hydrochlorothiazide (HYDRODIURIL) 25 MG tablet Take 25 mg by mouth daily. 12/18/21  Yes [provider]  meclizine (ANTIVERT) 25 MG tablet Take 1 tablet (25 mg total) by mouth 3 (three) times daily as needed for dizziness. 08/23/21  Yes Cuthriell, Charline Bills, PA-C  PROVENTIL HFA 108 (90 Base) MCG/ACT inhaler Inhale 2 puffs into the lungs every 4 (four) hours as needed for shortness of breath or wheezing. 12/22/21  Yes [provider]  potassium chloride (KLOR-CON) 8 MEQ tablet Take by mouth. Patient not taking: Reported on 05/31/2022 12/29/21   [provider]  potassium chloride SA (KLOR-CON M) 20 MEQ tablet Take 1 tablet (20 mEq total) by mouth 2 (two) times daily for 5 days. 08/23/21 08/28/21  Cuthriell, Charline Bills, PA-C    Inpatient Medications: Scheduled Meds:  Chlorhexidine Gluconate Cloth  6 each Topical Daily   guaiFENesin  600 mg Oral BID   methylPREDNISolone (SOLU-MEDROL) injection  40 mg Intravenous Daily   Continuous Infusions:  sodium chloride 10 mL/hr at 06/01/22 1250   azithromycin     cefTRIAXone (ROCEPHIN)  IV     heparin 700 Units/hr (06/01/22 1250)   potassium chloride 10  mEq (06/01/22 1333)   PRN Meds: sodium chloride, acetaminophen, docusate sodium, guaiFENesin-dextromethorphan, ipratropium-albuterol, ondansetron (ZOFRAN) IV, mouth rinse, polyethylene glycol  Allergies:  No Known Allergies  Social History:   Social History   Socioeconomic History   Marital status: Single    Spouse name: Not on file   Number of children: 0   Years of education: Not on file    Highest education level: High school graduate  Occupational History   Not on file  Tobacco Use   Smoking status: Never   Smokeless tobacco: Never  Vaping Use   Vaping Use: Never used  Substance and Sexual Activity   Alcohol use: Not Currently   Drug use: Never   Sexual activity: Not Currently    Birth control/protection: Post-menopausal  Other Topics Concern   Not on file  Social History Narrative   Not on file   Social Determinants of Health   Financial Resource Strain: Not on file  Food Insecurity: No Food Insecurity (02/09/2022)   Hunger Vital Sign    Worried About Running Out of Food in the Last Year: Never true    Ran Out of Food in the Last Year: Never true  Transportation Needs: No Transportation Needs (02/09/2022)   PRAPARE - Hydrologist (Medical): No    Lack of Transportation (Non-Medical): No  Physical Activity: Not on file  Stress: Not on file  Social Connections: Not on file  Intimate Partner Violence: Not on file     Family History:   History reviewed. No pertinent family history.  ROS:  Review of Systems  Constitutional:  Positive for malaise/fatigue and weight loss. Negative for chills, diaphoresis and fever.  HENT:  Negative for congestion.   Eyes:  Negative for discharge and redness.  Respiratory:  Positive for shortness of breath. Negative for cough, sputum production and wheezing.   Cardiovascular:  Negative for chest pain, palpitations, orthopnea, claudication, leg swelling and PND.  Gastrointestinal:  Negative for abdominal pain, heartburn, nausea and vomiting.  Musculoskeletal:  Negative for falls and myalgias.  Skin:  Negative for rash.  Neurological:  Positive for dizziness, sensory change, focal weakness and weakness. Negative for tingling, tremors, speech change and loss of consciousness.  Endo/Heme/Allergies:  Does not bruise/bleed easily.  Psychiatric/Behavioral:  Negative for substance abuse. The patient is not  nervous/anxious.   All other systems reviewed and are negative.     Physical Exam/Data:   Vitals:   06/01/22 1000 06/01/22 1147 06/01/22 1200 06/01/22 1300  BP: 92/60 94/62 101/64 103/63  Pulse: 90 82 87 87  Resp: '14 18 16 18  '$ Temp:      TempSrc:      SpO2: 98% 97% 98% 98%  Weight:      Height:        Intake/Output Summary (Last 24 hours) at 06/01/2022 1403 Last data filed at 06/01/2022 1250 Gross per 24 hour  Intake 2274.27 ml  Output 800 ml  Net 1474.27 ml   Filed Weights   05/31/22 1324 05/31/22 1920 06/01/22 0500  Weight: 47.6 kg 49.3 kg 49.3 kg   Body mass index is 16.53 kg/m.   Physical Exam: General: Well developed, well nourished, in no acute distress. Head: Normocephalic, atraumatic, sclera non-icteric, no xanthomas, nares without discharge.  Neck: Negative for carotid bruits. JVD not elevated. Lungs: Clear bilaterally to auscultation without wheezes, rales, or rhonchi. Breathing is unlabored. Heart: RRR with S1 S2. No murmurs, rubs, or gallops appreciated. Abdomen: Soft, non-tender, non-distended  with normoactive bowel sounds. No hepatomegaly. No rebound/guarding. No obvious abdominal masses. Msk:  Strength and tone appear normal for age. Extremities: No clubbing or cyanosis. No edema. Distal pedal pulses are 2+ and equal bilaterally. Neuro: Alert and oriented X 3. No facial asymmetry. No focal deficit.  Left upper extremity weakness. Psych:  Responds to questions appropriately with a normal affect.   EKG:  The EKG was personally reviewed and demonstrates: NSR, 99 bpm, baseline wandering, baseline artifact, nonspecific ST-T changes Telemetry:  Telemetry was personally reviewed and demonstrates: Sinus rhythm  Weights: Filed Weights   05/31/22 1324 05/31/22 1920 06/01/22 0500  Weight: 47.6 kg 49.3 kg 49.3 kg    Relevant CV Studies:  2D echo pending  Laboratory Data:  Chemistry Recent Labs  Lab 05/31/22 2257 06/01/22 0458 06/01/22 0906  NA 114*  117* 121*  K 3.5 3.4* 3.3*  CL 76* 80* 82*  CO2 '27 26 27  '$ GLUCOSE 97 85 77  BUN '9 9 9  '$ CREATININE 0.46 0.60 0.59  CALCIUM 7.8* 7.9* 8.1*  GFRNONAA >60 >60 >60  ANIONGAP '11 11 12    '$ Recent Labs  Lab 05/31/22 1628 06/01/22 0458  PROT 6.4* 5.6*  ALBUMIN 3.2* 2.7*  AST 29 25  ALT 14 14  ALKPHOS 67 60  BILITOT 1.3* 1.0   Hematology Recent Labs  Lab 05/31/22 1456 06/01/22 0906  WBC 27.7* 19.7*  RBC RESULTS UNAVAILABLE DUE TO INTERFERING SUBSTANCE 3.82*  HGB 14.0 11.5*  HCT RESULTS UNAVAILABLE DUE TO INTERFERING SUBSTANCE 32.2*  MCV RESULTS UNAVAILABLE DUE TO INTERFERING SUBSTANCE 84.3  MCH RESULTS UNAVAILABLE DUE TO INTERFERING SUBSTANCE 30.1  MCHC RESULTS UNAVAILABLE DUE TO INTERFERING SUBSTANCE 35.7  RDW RESULTS UNAVAILABLE DUE TO INTERFERING SUBSTANCE 11.2*  PLT 395 383   Cardiac EnzymesNo results for input(s): "TROPONINI" in the last 168 hours. No results for input(s): "TROPIPOC" in the last 168 hours.  BNPNo results for input(s): "BNP", "PROBNP" in the last 168 hours.  DDimer No results for input(s): "DDIMER" in the last 168 hours.  Radiology/Studies:  MR BRAIN WO CONTRAST  Result Date: 06/01/2022 IMPRESSION: MRI brain: 1.  No evidence of an acute intracranial abnormality. 2. Mild chronic small vessel ischemic changes within the cerebral white matter. 3. Mild generalized cerebral atrophy. 4. Mild mucosal thickening within the left maxillary sinus. 5. Bilateral mastoid effusions. MRI cervical spine: 1. Motion degraded examination, limiting evaluation. 2. Cervical spondylosis, as outlined. 3. No more than mild spinal canal stenosis. 4. Multilevel foraminal stenosis, as detailed and greatest on the right at C4-C5 (moderate) and bilaterally at C5-C6 (severe right, moderate/severe left). 5. Disc degeneration is greatest at C4-C5 and C5-C6 (moderate at these levels). Electronically Signed   By: Kellie Simmering D.O.   On: 06/01/2022 12:00   MR CERVICAL SPINE WO CONTRAST  Result  Date: 06/01/2022 IMPRESSION: MRI brain: 1.  No evidence of an acute intracranial abnormality. 2. Mild chronic small vessel ischemic changes within the cerebral white matter. 3. Mild generalized cerebral atrophy. 4. Mild mucosal thickening within the left maxillary sinus. 5. Bilateral mastoid effusions. MRI cervical spine: 1. Motion degraded examination, limiting evaluation. 2. Cervical spondylosis, as outlined. 3. No more than mild spinal canal stenosis. 4. Multilevel foraminal stenosis, as detailed and greatest on the right at C4-C5 (moderate) and bilaterally at C5-C6 (severe right, moderate/severe left). 5. Disc degeneration is greatest at C4-C5 and C5-C6 (moderate at these levels). Electronically Signed   By: Kellie Simmering D.O.   On: 06/01/2022 12:00  CT HEAD CODE STROKE WO CONTRAST`  Addendum Date: 06/01/2022   ADDENDUM REPORT: 06/01/2022 08:16 ADDENDUM: Study discussed by telephone with NP Donell Beers on 06/01/2022 at 0757 hours. Electronically Signed   By: Genevie Ann M.D.   On: 06/01/2022 08:16   Result Date: 06/01/2022 IMPRESSION: 1. Normal for age non contrast CT appearance of the brain. ASPECTS 10. 2. Left mastoid effusion, likely postinflammatory and significance doubtful. Electronically Signed: By: Genevie Ann M.D. On: 06/01/2022 07:53   CT Angio Chest PE W/Cm &/Or Wo Cm  Result Date: 05/31/2022 IMPRESSION: 1. No pulmonary embolism is seen. 2. There is opacification of the bilateral lower lobe posterior and lateral segmental airways, possibly from mucous plugging. There is associated bilateral lower lobe posterior and lateral segmental mild bronchial wall thickening and mild bronchiectasis. 3. There are scattered centrilobular and tree-in-bud airspace opacities within the bilateral lower lobes, medial segment of the right middle lobe, and anteromedial left upper lobe. This suggests mild multifocal airways disseminated infection, possibly chronic, and possibly related to aspiration. 4. There is reflux of  contrast into the inferior vena cava and hepatic veins, possibly from right heart insufficiency. Electronically Signed   By: Yvonne Kendall M.D.   On: 05/31/2022 18:47   DG Chest Port 1 View  Result Date: 05/31/2022 IMPRESSION: 1. No focal airspace opacity. 2. Visualized upper abdomen is notable for colonic gaseous distention. If there is clinical concern for abdominal pathology, further evaluation with a dedicated abdominal radiograph is recommended. Electronically Signed   By: Marin Roberts M.D.   On: 05/31/2022 14:23    Assessment and Plan:   1.  Elevated high-sensitivity troponin: -Never with chest pain -Possibly supply/demand ischemia in the setting of her acute presentation with severe sepsis with community-acquired pneumonia, acute hypercapnic respiratory failure, and multiple electrolyte and laboratory derangements  -Agree with heparin drip for total of 48 hours -Await echo with further recommendations pending these results -Lipid panel and A1c pending for further risk stratification  Remaining comorbidities and electrolyte abnormalities per primary service.        For questions or updates, please contact [facility] Please consult [email or web address] for contact info under Cardiology/STEMI.   Signed, Christell Faith, PA-C [facility] Pager: [phone] 06/01/2022, 2:03 PM

## 2022-06-01 NOTE — Plan of Care (Signed)
  Problem: Education: Goal: Knowledge of General Education information will improve Description: Including pain rating scale, medication(s)/side effects and non-pharmacologic comfort measures Outcome: Progressing   Problem: Clinical Measurements: Goal: Respiratory complications will improve Outcome: Progressing   Problem: Clinical Measurements: Goal: Cardiovascular complication will be avoided Outcome: Progressing   Problem: Nutrition: Goal: Adequate nutrition will be maintained Outcome: Progressing   Problem: Pain Managment: Goal: General experience of comfort will improve Outcome: Progressing   Problem: Safety: Goal: Ability to remain free from injury will improve Outcome: Progressing   

## 2022-06-01 NOTE — Consult Note (Signed)
[facility]  CONSULT NOTE    Date: 06/01/2022                  Patient Name:  Katie Moody  MRN: [id number]  DOB: 1961/02/26  Age / Sex: 62 y.o., female         PCP: [facility] [place]                 Service Requesting Consult: [place]                 Reason for Consult: Hyponatremia            History of Present Illness: Katie Moody is a 62 y.o.  female with past medical conditions including hypertension and tobacco use, who was admitted to [facility] on 05/31/2022 for Lactic acidosis [E87.20] Hyponatremia [E87.1] Weakness [R53.1] COPD exacerbation (HCC) [J44.1] NSTEMI (non-ST elevated myocardial infarction) ([place]) [I21.4] Chronic obstructive pulmonary disease, unspecified COPD type ([place]) [J44.9]  Patient presents to the emergency department with complaints of shortness of breath, cough and weakness.  According to patient, she has been experiencing progressive shortness of breath over the past week with productive cough.  She states she has began to feel dizzy with weakness for the past 1 to 2 days prior to arrival.  Patient does report 1 fall, lives with her sister.  Also reports poor appetite with generalized malaise.  Denies nausea, vomiting, or diarrhea.  States shortness of breath has improved slightly since admission.  Labs on ED arrival significant for sodium 110, potassium 2.8, glucose 101, calcium 8.4, albumin 3.2, troponins 1582, and lactic acid 2.7.  Respiratory panel negative for influenza, COVID-19, and RSV. CT head shows left mastoid effusion, significance doubtful. Urine osm 295   Medications: Outpatient medications: Medications Prior to Admission  Medication Sig Dispense Refill Last Dose   fluticasone (FLONASE) 50 MCG/ACT nasal spray Place 1 spray into both nostrils 2 (two) times daily. 16 g 0 unk   hydrochlorothiazide (HYDRODIURIL) 25 MG tablet Take 25 mg by mouth daily.   05/31/2022   meclizine (ANTIVERT) 25 MG tablet  Take 1 tablet (25 mg total) by mouth 3 (three) times daily as needed for dizziness. 30 tablet 0 unk   PROVENTIL HFA 108 (90 Base) MCG/ACT inhaler Inhale 2 puffs into the lungs every 4 (four) hours as needed for shortness of breath or wheezing.   unk   potassium chloride (KLOR-CON) 8 MEQ tablet Take by mouth. (Patient not taking: Reported on 05/31/2022)   Not Taking   potassium chloride SA (KLOR-CON M) 20 MEQ tablet Take 1 tablet (20 mEq total) by mouth 2 (two) times daily for 5 days. 10 tablet 0     Current medications: Current Facility-Administered Medications  Medication Dose Route Frequency Provider Last Rate Last Admin   0.9 %  sodium chloride infusion   Intravenous PRN Lang Snow, NP   Stopped at 06/01/22 0734   acetaminophen (TYLENOL) tablet 650 mg  650 mg Oral Q4H PRN Flora Lipps, MD   650 mg at 05/31/22 1837   azithromycin (ZITHROMAX) 500 mg in sodium chloride 0.9 % 250 mL IVPB  500 mg Intravenous Q24H Gonfa, Taye T, MD       cefTRIAXone (ROCEPHIN) 1 g in sodium chloride 0.9 % 100 mL IVPB  1 g Intravenous Q24H Gonfa, Charlesetta Ivory, MD       Chlorhexidine Gluconate Cloth 2 % PADS 6 each  6 each Topical Daily Lang Snow, NP  6 each at 06/01/22 0953   docusate sodium (COLACE) capsule 100 mg  100 mg Oral BID PRN Flora Lipps, MD       guaiFENesin (MUCINEX) 12 hr tablet 600 mg  600 mg Oral BID Gonfa, Taye T, MD       guaiFENesin-dextromethorphan (ROBITUSSIN DM) 100-10 MG/5ML syrup 5 mL  5 mL Oral Q4H PRN Wendee Beavers T, MD       heparin ADULT infusion 100 units/mL (25000 units/252m)  700 Units/hr Intravenous Continuous BRenda Rolls RPH 7 mL/hr at 06/01/22 0822 700 Units/hr at 06/01/22 [id number]  ipratropium-albuterol (DUONEB) 0.5-2.5 (3) MG/3ML nebulizer solution 3 mL  3 mL Nebulization Q4H PRN Gonfa, Taye T, MD       methylPREDNISolone sodium succinate (SOLU-MEDROL) 40 mg/mL injection 40 mg  40 mg Intravenous Daily Gonfa, Taye T, MD       ondansetron (ZOFRAN) injection 4  mg  4 mg Intravenous Q6H PRN KFlora Lipps MD       Oral care mouth rinse  15 mL Mouth Rinse PRN Ouma, EBing Neighbors NP       polyethylene glycol (MIRALAX / GLYCOLAX) packet 17 g  17 g Oral Daily PRN KFlora Lipps MD       potassium chloride 10 mEq in 100 mL IVPB  10 mEq Intravenous Q1 Hr x 3 GDallie Piles RPH 100 mL/hr at 06/01/22 0953 10 mEq at 06/01/22 [id number]     Allergies: No Known Allergies    Past Medical History: Past Medical History:  Diagnosis Date   Hypertension      Past Surgical History: History reviewed. No pertinent surgical history.   Family History: History reviewed. No pertinent family history.   Social History: Social History   Socioeconomic History   Marital status: Single    Spouse name: Not on file   Number of children: 0   Years of education: Not on file   Highest education level: High school graduate  Occupational History   Not on file  Tobacco Use   Smoking status: Never   Smokeless tobacco: Never  Vaping Use   Vaping Use: Never used  Substance and Sexual Activity   Alcohol use: Not Currently   Drug use: Never   Sexual activity: Not Currently    Birth control/protection: Post-menopausal  Other Topics Concern   Not on file  Social History Narrative   Not on file   Social Determinants of Health   Financial Resource Strain: Not on file  Food Insecurity: No Food Insecurity (02/09/2022)   Hunger Vital Sign    Worried About Running Out of Food in the Last Year: Never true    Ran Out of Food in the Last Year: Never true  Transportation Needs: No Transportation Needs (02/09/2022)   PRAPARE - THydrologist(Medical): No    Lack of Transportation (Non-Medical): No  Physical Activity: Not on file  Stress: Not on file  Social Connections: Not on file  Intimate Partner Violence: Not on file     Review of Systems: Review of Systems  Constitutional:  Negative for chills, fever and malaise/fatigue.  HENT:   Negative for congestion, sore throat and tinnitus.   Eyes:  Negative for blurred vision and redness.  Respiratory:  Positive for cough and shortness of breath. Negative for wheezing.   Cardiovascular:  Negative for chest pain, palpitations, claudication and leg swelling.  Gastrointestinal:  Negative for abdominal pain, blood in stool, diarrhea, nausea and vomiting.  Genitourinary:  Negative for flank pain, frequency and hematuria.  Musculoskeletal:  Negative for back pain, falls and myalgias.  Skin:  Negative for rash.  Neurological:  Positive for weakness. Negative for dizziness and headaches.  Endo/Heme/Allergies:  Does not bruise/bleed easily.  Psychiatric/Behavioral:  Negative for depression. The patient is not nervous/anxious and does not have insomnia.     Vital Signs: Blood pressure 94/62, pulse 82, temperature (!) 97.5 F (36.4 C), temperature source Oral, resp. rate 18, height '5\' 8"'$  (1.727 m), weight 49.3 kg, SpO2 97 %.  Weight trends: Filed Weights   05/31/22 1324 05/31/22 1920 06/01/22 0500  Weight: 47.6 kg 49.3 kg 49.3 kg    Physical Exam: General: NAD, resting in bed  Head: Normocephalic, atraumatic. Dry oral mucosal membranes  Eyes: Anicteric  Lungs:  Clear to auscultation, normal effort  Heart: Regular rate and rhythm  Abdomen:  Soft, nontender  Extremities:  No peripheral edema.  Neurologic: Alert and oriented, moving all four extremities  Skin: No lesions  Access: None     Lab results: Basic Metabolic Panel: Recent Labs  Lab 05/31/22 2257 06/01/22 0458 06/01/22 0906  NA 114* 117* 121*  K 3.5 3.4* 3.3*  CL 76* 80* 82*  CO2 '27 26 27  '$ GLUCOSE 97 85 77  BUN '9 9 9  '$ CREATININE 0.46 0.60 0.59  CALCIUM 7.8* 7.9* 8.1*  MG  --  1.8  --   PHOS  --  3.2  --     Liver Function Tests: Recent Labs  Lab 05/31/22 1628 06/01/22 0458  AST 29 25  ALT 14 14  ALKPHOS 67 60  BILITOT 1.3* 1.0  PROT 6.4* 5.6*  ALBUMIN 3.2* 2.7*   Recent Labs  Lab  05/31/22 2109  LIPASE 59*   No results for input(s): "AMMONIA" in the last 168 hours.  CBC: Recent Labs  Lab 05/31/22 1456 06/01/22 0906  WBC 27.7* 19.7*  NEUTROABS  --  17.9*  HGB 14.0 11.5*  HCT RESULTS UNAVAILABLE DUE TO INTERFERING SUBSTANCE 32.2*  MCV RESULTS UNAVAILABLE DUE TO INTERFERING SUBSTANCE 84.3  PLT 395 383    Cardiac Enzymes: No results for input(s): "CKTOTAL", "CKMB", "CKMBINDEX", "TROPONINI" in the last 168 hours.  BNP: Invalid input(s): "POCBNP"  CBG: Recent Labs  Lab 05/31/22 1927  GLUCAP 10    Microbiology: Results for orders placed or performed during the hospital encounter of 05/31/22  Blood Culture (routine x 2)     Status: None (Preliminary result)   Collection Time: 05/31/22  2:58 PM   Specimen: BLOOD  Result Value Ref Range Status   Specimen Description BLOOD RAC  Final   Special Requests   Final    BOTTLES DRAWN AEROBIC AND ANAEROBIC Blood Culture results may not be optimal due to an inadequate volume of blood received in culture bottles   Culture   Final    NO GROWTH < 24 HOURS Performed at [facility], [address], [place],  [address]    Report Status PENDING  Incomplete  Blood Culture (routine x 2)     Status: None (Preliminary result)   Collection Time: 05/31/22  3:03 PM   Specimen: BLOOD  Result Value Ref Range Status   Specimen Description BLOOD LAC  Final   Special Requests   Final    BOTTLES DRAWN AEROBIC AND ANAEROBIC Blood Culture adequate volume   Culture   Final    NO GROWTH < 24 HOURS Performed at [facility], [address] [place],  [place], [place] [address]    Report Status PENDING  Incomplete  Resp panel by RT-PCR (RSV, Flu A&B, Covid) Anterior Nasal Swab     Status: None   Collection Time: 05/31/22  4:28 PM   Specimen: Anterior Nasal Swab  Result Value Ref Range Status   SARS Coronavirus 2 by RT PCR NEGATIVE NEGATIVE Final    Comment: (NOTE) SARS-CoV-2 target nucleic acids are NOT  DETECTED.  The SARS-CoV-2 RNA is generally detectable in upper respiratory specimens during the acute phase of infection. The lowest concentration of SARS-CoV-2 viral copies this assay can detect is 138 copies/mL. A negative result does not preclude SARS-Cov-2 infection and should not be used as the sole basis for treatment or other patient management decisions. A negative result may occur with  improper specimen collection/handling, submission of specimen other than nasopharyngeal swab, presence of viral mutation(s) within the areas targeted by this assay, and inadequate number of viral copies(<138 copies/mL). A negative result must be combined with clinical observations, patient history, and epidemiological information. The expected result is Negative.  Fact Sheet for Patients:  EntrepreneurPulse.com.au  Fact Sheet for Healthcare Providers:  IncredibleEmployment.be  This test is no t yet approved or cleared by the [place] FDA and  has been authorized for detection and/or diagnosis of SARS-CoV-2 by FDA under an Emergency Use Authorization (EUA). This EUA will remain  in effect (meaning this test can be used) for the duration of the COVID-19 declaration under Section 564(b)(1) of the Act, 21 U.S.C.section 360bbb-3(b)(1), unless the authorization is terminated  or revoked sooner.       Influenza A by PCR NEGATIVE NEGATIVE Final   Influenza B by PCR NEGATIVE NEGATIVE Final    Comment: (NOTE) The Xpert Xpress SARS-CoV-2/FLU/RSV plus assay is intended as an aid in the diagnosis of influenza from Nasopharyngeal swab specimens and should not be used as a sole basis for treatment. Nasal washings and aspirates are unacceptable for Xpert Xpress SARS-CoV-2/FLU/RSV testing.  Fact Sheet for Patients: EntrepreneurPulse.com.au  Fact Sheet for Healthcare Providers: IncredibleEmployment.be  This test is not yet  approved or cleared by the [place] FDA and has been authorized for detection and/or diagnosis of SARS-CoV-2 by FDA under an Emergency Use Authorization (EUA). This EUA will remain in effect (meaning this test can be used) for the duration of the COVID-19 declaration under Section 564(b)(1) of the Act, 21 U.S.C. section 360bbb-3(b)(1), unless the authorization is terminated or revoked.     Resp Syncytial Virus by PCR NEGATIVE NEGATIVE Final    Comment: (NOTE) Fact Sheet for Patients: EntrepreneurPulse.com.au  Fact Sheet for Healthcare Providers: IncredibleEmployment.be  This test is not yet approved or cleared by the [place] FDA and has been authorized for detection and/or diagnosis of SARS-CoV-2 by FDA under an Emergency Use Authorization (EUA). This EUA will remain in effect (meaning this test can be used) for the duration of the COVID-19 declaration under Section 564(b)(1) of the Act, 21 U.S.C. section 360bbb-3(b)(1), unless the authorization is terminated or revoked.  Performed at [facility], [address] [place], [place] [address]   MRSA Next Gen by PCR, Nasal     Status: None   Collection Time: 05/31/22  9:11 PM   Specimen: Nasal Mucosa; Nasal Swab  Result Value Ref Range Status   MRSA by PCR Next Gen NOT DETECTED NOT DETECTED Final    Comment: (NOTE) The GeneXpert MRSA Assay (FDA approved for NASAL specimens only), is one component of a comprehensive MRSA colonization  surveillance program. It is not intended to diagnose MRSA infection nor to guide or monitor treatment for MRSA infections. Test performance is not FDA approved in patients less than 70 years old. Performed at [facility], [address] [place], [place] [address]     Coagulation Studies: Recent Labs    05/31/22 1458  LABPROT 13.9  INR 1.1    Urinalysis: Recent Labs    05/31/22 2115  COLORURINE YELLOW*  LABSPEC 1.043*   PHURINE 7.0  GLUCOSEU NEGATIVE  HGBUR NEGATIVE  BILIRUBINUR NEGATIVE  KETONESUR 20*  PROTEINUR NEGATIVE  NITRITE NEGATIVE  LEUKOCYTESUR NEGATIVE      Imaging: MR BRAIN WO CONTRAST  Result Date: 06/01/2022 CLINICAL DATA:  Provided history: Neuro deficit, acute, stroke suspected. EXAM: MRI HEAD WITHOUT CONTRAST MRI CERVICAL SPINE WITHOUT CONTRAST TECHNIQUE: Multiplanar, multiecho pulse sequences of the brain and surrounding structures, and cervical spine, to include the craniocervical junction and cervicothoracic junction, were obtained without intravenous contrast. COMPARISON:  Noncontrast head CT performed earlier today 06/01/2022. FINDINGS: MRI HEAD FINDINGS Brain: Mild generalized cerebral atrophy. Multifocal T2 FLAIR hyperintense signal abnormality within the cerebral white matter, nonspecific but compatible with mild chronic small vessel ischemic disease. There is no acute infarct. No evidence of an intracranial mass. No chronic intracranial blood products. No extra-axial fluid collection. No midline shift. Vascular: Maintained flow voids within the proximal large arterial vessels. Skull and upper cervical spine: No focal suspicious marrow lesion. Sinuses/Orbits: No mass or acute finding within the imaged orbits. Mild mucosal thickening within the left maxillary sinus. Other: Bilateral mastoid effusions (larger on the left). MRI CERVICAL SPINE FINDINGS Intermittently motion degraded exam. Most notably, the sagittal STIR sequence and axial T2 GRE sequences are moderate-to-severely motion degraded. Alignment: Straightening of the expected cervical lordosis. No significant spondylolisthesis. Vertebrae: Vertebral body height is maintained. No significant marrow edema or focal suspicious osseous lesion is identified. Small multilevel vertebral body hemangiomas. Cord: Within the limitations of motion degradation, no signal abnormality is identified within the cervical spinal cord. Posterior Fossa,  vertebral arteries, paraspinal tissues: Posterior fossa assessed on same-day brain MRI. Flow voids preserved within the imaged cervical vertebral arteries. No paraspinal mass or collection. Disc levels: Moderate disc degeneration at C4-C5 and C5-C6. No more than mild disc degeneration at the remaining levels. C2-C3: Small central disc protrusion. No significant spinal canal or foraminal stenosis. C3-C4: Small central disc protrusion. Uncovertebral hypertrophy on the right. Mild effacement of the ventral thecal sac (without spinal cord mass effect). Mild right neural foraminal narrowing. C4-C5: Disc bulge with bilateral uncovertebral hypertrophy. Mild effacement of the ventral thecal sac (without spinal cord mass effect). Bilateral neural foraminal narrowing (moderate right, mild left). C5-C6: Disc bulge with bilateral uncovertebral hypertrophy. Mild effacement of the ventral thecal sac (without spinal cord mass effect). Bilateral neural foraminal narrowing (severe right, moderate/severe left). C6-C7: Slight disc bulge. No significant spinal canal or foraminal stenosis. C7-T1: Slight disc bulge. No significant spinal canal or foraminal stenosis. IMPRESSION: MRI brain: 1.  No evidence of an acute intracranial abnormality. 2. Mild chronic small vessel ischemic changes within the cerebral white matter. 3. Mild generalized cerebral atrophy. 4. Mild mucosal thickening within the left maxillary sinus. 5. Bilateral mastoid effusions. MRI cervical spine: 1. Motion degraded examination, limiting evaluation. 2. Cervical spondylosis, as outlined. 3. No more than mild spinal canal stenosis. 4. Multilevel foraminal stenosis, as detailed and greatest on the right at C4-C5 (moderate) and bilaterally at C5-C6 (severe right, moderate/severe left). 5. Disc degeneration is greatest at  C4-C5 and C5-C6 (moderate at these levels). Electronically Signed   By: Kellie Simmering D.O.   On: 06/01/2022 12:00   MR CERVICAL SPINE WO  CONTRAST  Result Date: 06/01/2022 CLINICAL DATA:  Provided history: Neuro deficit, acute, stroke suspected. EXAM: MRI HEAD WITHOUT CONTRAST MRI CERVICAL SPINE WITHOUT CONTRAST TECHNIQUE: Multiplanar, multiecho pulse sequences of the brain and surrounding structures, and cervical spine, to include the craniocervical junction and cervicothoracic junction, were obtained without intravenous contrast. COMPARISON:  Noncontrast head CT performed earlier today 06/01/2022. FINDINGS: MRI HEAD FINDINGS Brain: Mild generalized cerebral atrophy. Multifocal T2 FLAIR hyperintense signal abnormality within the cerebral white matter, nonspecific but compatible with mild chronic small vessel ischemic disease. There is no acute infarct. No evidence of an intracranial mass. No chronic intracranial blood products. No extra-axial fluid collection. No midline shift. Vascular: Maintained flow voids within the proximal large arterial vessels. Skull and upper cervical spine: No focal suspicious marrow lesion. Sinuses/Orbits: No mass or acute finding within the imaged orbits. Mild mucosal thickening within the left maxillary sinus. Other: Bilateral mastoid effusions (larger on the left). MRI CERVICAL SPINE FINDINGS Intermittently motion degraded exam. Most notably, the sagittal STIR sequence and axial T2 GRE sequences are moderate-to-severely motion degraded. Alignment: Straightening of the expected cervical lordosis. No significant spondylolisthesis. Vertebrae: Vertebral body height is maintained. No significant marrow edema or focal suspicious osseous lesion is identified. Small multilevel vertebral body hemangiomas. Cord: Within the limitations of motion degradation, no signal abnormality is identified within the cervical spinal cord. Posterior Fossa, vertebral arteries, paraspinal tissues: Posterior fossa assessed on same-day brain MRI. Flow voids preserved within the imaged cervical vertebral arteries. No paraspinal mass or collection.  Disc levels: Moderate disc degeneration at C4-C5 and C5-C6. No more than mild disc degeneration at the remaining levels. C2-C3: Small central disc protrusion. No significant spinal canal or foraminal stenosis. C3-C4: Small central disc protrusion. Uncovertebral hypertrophy on the right. Mild effacement of the ventral thecal sac (without spinal cord mass effect). Mild right neural foraminal narrowing. C4-C5: Disc bulge with bilateral uncovertebral hypertrophy. Mild effacement of the ventral thecal sac (without spinal cord mass effect). Bilateral neural foraminal narrowing (moderate right, mild left). C5-C6: Disc bulge with bilateral uncovertebral hypertrophy. Mild effacement of the ventral thecal sac (without spinal cord mass effect). Bilateral neural foraminal narrowing (severe right, moderate/severe left). C6-C7: Slight disc bulge. No significant spinal canal or foraminal stenosis. C7-T1: Slight disc bulge. No significant spinal canal or foraminal stenosis. IMPRESSION: MRI brain: 1.  No evidence of an acute intracranial abnormality. 2. Mild chronic small vessel ischemic changes within the cerebral white matter. 3. Mild generalized cerebral atrophy. 4. Mild mucosal thickening within the left maxillary sinus. 5. Bilateral mastoid effusions. MRI cervical spine: 1. Motion degraded examination, limiting evaluation. 2. Cervical spondylosis, as outlined. 3. No more than mild spinal canal stenosis. 4. Multilevel foraminal stenosis, as detailed and greatest on the right at C4-C5 (moderate) and bilaterally at C5-C6 (severe right, moderate/severe left). 5. Disc degeneration is greatest at C4-C5 and C5-C6 (moderate at these levels). Electronically Signed   By: Kellie Simmering D.O.   On: 06/01/2022 12:00   CT HEAD CODE STROKE WO CONTRAST`  Addendum Date: 06/01/2022   ADDENDUM REPORT: 06/01/2022 08:16 ADDENDUM: Study discussed by telephone with NP Donell Beers on 06/01/2022 at 0757 hours. Electronically Signed   By: Genevie Ann M.D.    On: 06/01/2022 08:16   Result Date: 06/01/2022 CLINICAL DATA:  Code stroke. 62 year old female new left upper extremity weakness. EXAM:  CT HEAD WITHOUT CONTRAST TECHNIQUE: Contiguous axial images were obtained from the base of the skull through the vertex without intravenous contrast. RADIATION DOSE REDUCTION: This exam was performed according to the departmental dose-optimization program which includes automated exposure control, adjustment of the mA and/or kV according to patient size and/or use of iterative reconstruction technique. COMPARISON:  None Available. FINDINGS: Brain: Cerebral volume is within normal limits for age. No midline shift, ventriculomegaly, mass effect, evidence of mass lesion, intracranial hemorrhage or evidence of cortically based acute infarction. Gray-white matter differentiation within normal limits the for age throughout the brain. Vascular: Calcified atherosclerosis at the skull base. No suspicious intracranial vascular hyperdensity. Skull: No acute osseous abnormality identified. Sinuses/Orbits: Mild to moderate left mastoid effusion. Left tympanic cavity appears to remain clear. Grossly negative visible nasopharynx. Other Visualized paranasal sinuses and mastoids are clear. Other: Visualized orbits and scalp soft tissues are within normal limits. ASPECTS [facility] Stroke Program Early CT Score) Total score (0-10 with 10 being normal): 10 IMPRESSION: 1. Normal for age non contrast CT appearance of the brain. ASPECTS 10. 2. Left mastoid effusion, likely postinflammatory and significance doubtful. Electronically Signed: By: Genevie Ann M.D. On: 06/01/2022 07:53   CT Angio Chest PE W/Cm &/Or Wo Cm  Result Date: 05/31/2022 CLINICAL DATA:  Pulmonary embolism suspected. High probability. Near syncopal episode. Difficulty breathing. EXAM: CT ANGIOGRAPHY CHEST WITH CONTRAST TECHNIQUE: Multidetector CT imaging of the chest was performed using the standard protocol during bolus administration  of intravenous contrast. Multiplanar CT image reconstructions and MIPs were obtained to evaluate the vascular anatomy. RADIATION DOSE REDUCTION: This exam was performed according to the departmental dose-optimization program which includes automated exposure control, adjustment of the mA and/or kV according to patient size and/or use of iterative reconstruction technique. CONTRAST:  24m OMNIPAQUE IOHEXOL 350 MG/ML SOLN COMPARISON:  AP chest 05/31/2022, chest two views 08/23/2021 FINDINGS: Cardiovascular: The main pulmonary artery is opacified up to 905 Hounsfield units. No filling defect is seen to indicate a pulmonary embolism. Heart size is normal. No pericardial effusion. No thoracic aortic aneurysm. Moderate atherosclerotic calcifications within the thoracic aorta. Mediastinum/Nodes: No axillary, mediastinal, or hilar pathologically enlarged lymph nodes I CT criteria. The visualized thyroid is unremarkable. Probable tiny sliding hiatal hernia. Lungs/Pleura: The central airways are patent. There is opacification of the bilateral lower lobe posterior and lateral segmental airways fairly symmetrically, possibly from mucous plugging (axial series 9 images 99 through 137). There is associated bilateral lower lobe posterior and lateral segmental mild bronchial wall thickening and mild bronchiectasis. There are scattered centrilobular and tree-in-bud airspace opacities within the bilateral lower lobes, greatest at the far inferior left-greater-than-right aspects. This suggests airways disseminated infection, possibly chronic, and possibly related to aspiration. There is also similar opacification of the medial segment of the right middle lobe airway with distal curvilinear and centrilobular/tree-in-bud opacities (axial series 9, images 109 through 129). Mild similar curvilinear and centrilobular nodular densities within the anteromedial left upper lobe (axial series 9, image 76). No pleural effusion or pneumothorax.  Upper Abdomen: There is reflux of contrast into the inferior vena cava and hepatic veins, possibly from right heart insufficiency. No acute abnormality is seen within the upper abdomen. Musculoskeletal: Moderate multilevel degenerative disc changes of the midthoracic spine. Review of the MIP images confirms the above findings. IMPRESSION: 1. No pulmonary embolism is seen. 2. There is opacification of the bilateral lower lobe posterior and lateral segmental airways, possibly from mucous plugging. There is associated bilateral lower lobe posterior and lateral  segmental mild bronchial wall thickening and mild bronchiectasis. 3. There are scattered centrilobular and tree-in-bud airspace opacities within the bilateral lower lobes, medial segment of the right middle lobe, and anteromedial left upper lobe. This suggests mild multifocal airways disseminated infection, possibly chronic, and possibly related to aspiration. 4. There is reflux of contrast into the inferior vena cava and hepatic veins, possibly from right heart insufficiency. Electronically Signed   By: Yvonne Kendall M.D.   On: 05/31/2022 18:47   DG Chest Port 1 View  Result Date: 05/31/2022 CLINICAL DATA:  Sepsis EXAM: PORTABLE CHEST 1 VIEW COMPARISON:  08/23/21 CXR FINDINGS: No pleural effusion. No pneumothorax. No focal airspace opacity. Normal cardiac and mediastinal contours. No radiographically apparent displaced fracture. Visualized upper abdomen is notable for colonic gaseous distention IMPRESSION: 1. No focal airspace opacity. 2. Visualized upper abdomen is notable for colonic gaseous distention. If there is clinical concern for abdominal pathology, further evaluation with a dedicated abdominal radiograph is recommended. Electronically Signed   By: Marin Roberts M.D.   On: 05/31/2022 14:23     Assessment & Plan: Ms. Lucita Rehagen is a 62 y.o.  female with past medical conditions including hypertension and tobacco use, who was admitted to [facility]  on 05/31/2022 for Lactic acidosis [E87.20] Hyponatremia [E87.1] Weakness [R53.1] COPD exacerbation (HCC) [J44.1] NSTEMI (non-ST elevated myocardial infarction) ([place]) [I21.4] Chronic obstructive pulmonary disease, unspecified COPD type ([place]) [J44.9]  Hyponatremia, severe. Symptomatic. Sodium 110 on ED arrival. Likely due to increased free water intake. Urine osm 295. IVF started in ED. Now corrected to 121. Will stop IVF for now and continue to monitor correction.    LOS: 1 Burnadette Baskett 2/26/202412:06 PM

## 2022-06-01 NOTE — Consult Note (Signed)
Neurology Consultation Reason for Consult: Left-sided weakness Referring Physician: Mortimer Fries, K  CC: Left-sided weakness  History is obtained from: Patient, chart  HPI: Katie Moody is a 62 y.o. female with a history of tobacco use, hypertension who was admitted with shortness of breath, not feeling well, cough.  She also states that her left side has not been working well since prior to arrival to the hospital, though she apparently did not mention it during her initial evaluations.  Her neurological evaluation on admission notes that she is alert and oriented with cranial nerves intact, but does not mention appendicular strength.  The ED provider neurological exam notes that she is alert and that her mental status is at baseline, but also does not mention appendicular strength.  She was found to have a sodium of 110, and was admitted for sodium correction to the ICU.  The patient states that it started yesterday, she is not certain whether she woke up with it yesterday morning or whether it developed at some point during the day, but is quite confident that it was present on her initial presentation to the hospital.  Due to this code stroke was activated at 7:30 AM this morning.  There was some confusion about how to activate the telemetry specialist cart and therefore I evaluated the patient when I arrived at approximately 8:05 this morning.  LKW: Unclear time of onset tnk given?: no, unclear time of onset    Past Medical History:  Diagnosis Date   Hypertension      History reviewed. No pertinent family history.   Social History:  reports that she has never smoked. She has never used smokeless tobacco. She reports that she does not currently use alcohol. She reports that she does not use drugs.   Exam: Current vital signs: BP (!) 87/59   Pulse 87   Temp (!) 97.5 F (36.4 C) (Oral)   Resp 16   Ht '5\' 8"'$  (1.727 m)   Wt 49.3 kg   SpO2 100%   BMI 16.53 kg/m  Vital signs in  last 24 hours: Temp:  [97.4 F (36.3 C)-97.6 F (36.4 C)] 97.5 F (36.4 C) (02/26 0100) Pulse Rate:  [76-103] 87 (02/26 0800) Resp:  [13-24] 16 (02/26 0800) BP: (83-111)/(53-85) 87/59 (02/26 0800) SpO2:  [91 %-100 %] 100 % (02/26 0800) Weight:  [47.6 kg-49.3 kg] 49.3 kg (02/26 0500)   Physical Exam  Appears well-developed and well-nourished.   Neuro: Mental Status: Patient is awake, alert, oriented to person, place, month, year, and situation. Patient is able to give a clear and coherent history. No signs of aphasia or neglect Cranial Nerves: II: Visual Fields are full. Pupils are equal, round, and reactive to light.   III,IV, VI: EOMI without ptosis or diploplia.  V: Facial sensation is symmetric to temperature VII: I do question a mild left facial weakness on smiling, though this is not as apparent at rest VIII: hearing is intact to voice X: Uvula elevates symmetrically XI: Shoulder shrug is symmetric. XII: tongue is midline without atrophy or fasciculations.  Motor: She has severe weakness of the left triceps, wrist extension, finger extension with relatively preserved grip, elbow flexion, shoulder abduction most consistent with a radial neuropathy.  When testing her leg strength, she gives poor effort, but does not lift her left leg against drift the way that she holds the right leg and states that she feels that it is weak and this is new. Sensory: Sensation is diminished over the dorsum  of the left hand with preserved sensation on the anterior aspect as well as on the arm. Cerebellar: No clear ataxia on the right arm  NIHSS score: 6 1A: Level of Consciousness - 0 1B: Ask Month and Age - 0 1C: 'Blink Eyes' & 'Squeeze Hands' - 0 2: Test Horizontal Extraocular Movements - 0 3: Test Visual Fields - 0 4: Test Facial Palsy - 1 5A: Test Left Arm Motor Drift - 2 5B: Test Right Arm Motor Drift - 0 6A: Test Left Leg Motor Drift - 2 6B: Test Right Leg Motor Drift - 0 7: Test  Limb Ataxia - 0 8: Test Sensation - 1 9: Test Language/Aphasia- 0 10: Test Dysarthria - 0 11: Test Extinction/Inattention - 0      I have reviewed labs in epic and the results pertinent to this consultation are: Sodium 117, increased from 110 yesterday Calcium-7.9  I have reviewed the images obtained: CT head-negative  Impression: 62 year old female with left-sided weakness.  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  I had difficulty determining if there was true weakness of the left leg versus subjective effort based difficulty.  She very clearly has true left arm weakness.  I do think an MRI of the brain and cervical spine would be prudent.  If this does reveal ischemic stroke, then further stroke workup will be needed, if not then I would favor treating this as a radial nerve palsy.  Recommendations: 1) MRI brain, C-spine 2) further recommendations pending above.   Roland Rack, MD [facility] [phone]  If 7pm- 7am, please page neurology on call as listed in [place].

## 2022-06-02 LAB — BASIC METABOLIC PANEL
Anion gap: 10 (ref 5–15)
Anion gap: 10 (ref 5–15)
Anion gap: 8 (ref 5–15)
Anion gap: 6 (ref 5–15)
BUN: 7 mg/dL — ABNORMAL LOW (ref 8–23)
BUN: 6 mg/dL — ABNORMAL LOW (ref 8–23)
BUN: 7 mg/dL — ABNORMAL LOW (ref 8–23)
BUN: 7 mg/dL — ABNORMAL LOW (ref 8–23)
CO2: 29 mmol/L (ref 22–32)
CO2: 31 mmol/L (ref 22–32)
CO2: 31 mmol/L (ref 22–32)
CO2: 35 mmol/L — ABNORMAL HIGH (ref 22–32)
Calcium: 8 mg/dL — ABNORMAL LOW (ref 8.9–10.3)
Calcium: 8.4 mg/dL — ABNORMAL LOW (ref 8.9–10.3)
Calcium: 8.1 mg/dL — ABNORMAL LOW (ref 8.9–10.3)
Calcium: 8.1 mg/dL — ABNORMAL LOW (ref 8.9–10.3)
Chloride: 85 mmol/L — ABNORMAL LOW (ref 98–111)
Chloride: 83 mmol/L — ABNORMAL LOW (ref 98–111)
Chloride: 85 mmol/L — ABNORMAL LOW (ref 98–111)
Chloride: 84 mmol/L — ABNORMAL LOW (ref 98–111)
Creatinine, Ser: 0.39 mg/dL — ABNORMAL LOW (ref 0.44–1.00)
Creatinine, Ser: 0.49 mg/dL (ref 0.44–1.00)
Creatinine, Ser: 0.45 mg/dL (ref 0.44–1.00)
Creatinine, Ser: 0.42 mg/dL — ABNORMAL LOW (ref 0.44–1.00)
GFR, Estimated: >60 mL/min (ref >60)
GFR, Estimated: >60 mL/min (ref >60)
GFR, Estimated: >60 mL/min (ref >60)
GFR, Estimated: >60 mL/min (ref >60)
Glucose, Bld: 128 mg/dL — ABNORMAL HIGH (ref 70–99)
Glucose, Bld: 202 mg/dL — ABNORMAL HIGH (ref 70–99)
Glucose, Bld: 166 mg/dL — ABNORMAL HIGH (ref 70–99)
Glucose, Bld: 159 mg/dL — ABNORMAL HIGH (ref 70–99)
Potassium: 3.5 mmol/L (ref 3.5–5.1)
Potassium: 4.9 mmol/L (ref 3.5–5.1)
Potassium: 4.1 mmol/L (ref 3.5–5.1)
Potassium: 4.2 mmol/L (ref 3.5–5.1)
Sodium: 124 mmol/L — ABNORMAL LOW (ref 135–145)
Sodium: 124 mmol/L — ABNORMAL LOW (ref 135–145)
Sodium: 124 mmol/L — ABNORMAL LOW (ref 135–145)
Sodium: 125 mmol/L — ABNORMAL LOW (ref 135–145)

## 2022-06-02 LAB — PROCALCITONIN: Procalcitonin: 0.19 ng/mL

## 2022-06-02 LAB — HEPARIN LEVEL (UNFRACTIONATED)
Heparin Unfractionated: 0.35 IU/mL (ref 0.30–0.70)
Heparin Unfractionated: 0.33 IU/mL (ref 0.30–0.70)

## 2022-06-02 LAB — HEPATIC FUNCTION PANEL
ALT: 14 U/L (ref 0–44)
AST: 21 U/L (ref 15–41)
Albumin: 2.7 g/dL — ABNORMAL LOW (ref 3.5–5.0)
Alkaline Phosphatase: 62 U/L (ref 38–126)
Bilirubin, Direct: 0.2 mg/dL (ref 0.0–0.2)
Indirect Bilirubin: 0.6 mg/dL (ref 0.3–0.9)
Total Bilirubin: 0.8 mg/dL (ref 0.3–1.2)
Total Protein: 5.6 g/dL — ABNORMAL LOW (ref 6.5–8.1)

## 2022-06-02 LAB — CBC WITH DIFFERENTIAL/PLATELET
Abs Immature Granulocytes: 0.14 10*3/uL — ABNORMAL HIGH (ref 0.00–0.07)
Basophils Absolute: 0 10*3/uL (ref 0.0–0.1)
Basophils Relative: 0 %
Eosinophils Absolute: 0 10*3/uL (ref 0.0–0.5)
Eosinophils Relative: 0 %
HCT: 30.6 % — ABNORMAL LOW (ref 36.0–46.0)
Hemoglobin: 10.9 g/dL — ABNORMAL LOW (ref 12.0–15.0)
Immature Granulocytes: 1 %
Lymphocytes Relative: 4 %
Lymphs Abs: 0.9 10*3/uL (ref 0.7–4.0)
MCH: 30.6 pg (ref 26.0–34.0)
MCHC: 35.6 g/dL (ref 30.0–36.0)
MCV: 86 fL (ref 80.0–100.0)
Monocytes Absolute: 1.6 10*3/uL — ABNORMAL HIGH (ref 0.1–1.0)
Monocytes Relative: 7 %
Neutro Abs: 19.4 10*3/uL — ABNORMAL HIGH (ref 1.7–7.7)
Neutrophils Relative %: 88 %
Platelets: 364 10*3/uL (ref 150–400)
RBC: 3.56 MIL/uL — ABNORMAL LOW (ref 3.87–5.11)
RDW: 11.3 % — ABNORMAL LOW (ref 11.5–15.5)
WBC: 22.2 10*3/uL — ABNORMAL HIGH (ref 4.0–10.5)
nRBC: 0 % (ref 0.0–0.2)

## 2022-06-02 LAB — PHOSPHORUS: Phosphorus: 1.7 mg/dL — ABNORMAL LOW (ref 2.5–4.6)

## 2022-06-02 LAB — LIPID PANEL
Cholesterol: 98 mg/dL (ref 0–200)
HDL: 51 mg/dL (ref >40)
LDL Cholesterol: 39 mg/dL (ref 0–99)
Total CHOL/HDL Ratio: 1.9 RATIO
Triglycerides: 39 mg/dL (ref <150)
VLDL: 8 mg/dL (ref 0–40)

## 2022-06-02 LAB — CK: Total CK: 111 U/L (ref 38–234)

## 2022-06-02 LAB — MAGNESIUM: Magnesium: 2.2 mg/dL (ref 1.7–2.4)

## 2022-06-02 MED ORDER — POTASSIUM PHOSPHATES 45 MMOLE/15ML IV SOLN
30.0000 mmol | Freq: Once | INTRAVENOUS | Status: AC
  Administered 2022-06-02: 30 mmol via INTRAVENOUS
  Filled 2022-06-02: qty 10

## 2022-06-02 MED ORDER — ADULT MULTIVITAMIN W/MINERALS CH
1.0000 | ORAL_TABLET | Freq: Every day | ORAL | Status: DC
  Administered 2022-06-03 – 2022-06-08 (×6): 1 via ORAL
  Filled 2022-06-02 (×6): qty 1

## 2022-06-02 MED ORDER — METOPROLOL SUCCINATE ER 25 MG PO TB24
12.5000 mg | ORAL_TABLET | Freq: Every day | ORAL | Status: DC
  Administered 2022-06-02: 12.5 mg via ORAL
  Filled 2022-06-02: qty 1

## 2022-06-02 MED ORDER — ENSURE ENLIVE PO LIQD
237.0000 mL | Freq: Three times a day (TID) | ORAL | Status: DC
  Administered 2022-06-03 – 2022-06-05 (×3): 237 mL via ORAL

## 2022-06-02 NOTE — Progress Notes (Signed)
PROGRESS NOTE  Katie Moody [id number] DOB: [birth year]   PCP: [facility] [place]  Patient is from: Home  DOA: 05/31/2022 LOS: 2  Chief complaints Chief Complaint  Patient presents with   Weakness     Brief Narrative / Interim history: 62 year old F with PMH of HTN and tobacco use disorder presenting with shortness of breath and productive cough for 5 days and generalized weakness for 2 days, dizzy nests and fall without trauma or loss of consciousness, and admitted to ICU with severe hyponatremia, hypokalemia, bibasilar pneumonia with mucous plugging and non-STEMI.   In ED, vital stable except for mild tachycardia. Na 110. K 2.8.  Lactic acid 3.0.  CXR without focal airspace opacity.  CTA chest negative for PE but opacification of the bilateral lower lobe posterior and lateral segmental airways concerning for mucous plugging.  CTA chest also showed scattered centrilobular and tree-in-bud airspace opacities within the bilateral lower lobes, medial segment of RML and anteromedial LUL.  Patient was started on IV NS fluid, IV Solu-Medrol, DuoNeb, antibiotic for CAP coverage and IV heparin for non-STEMI, and admitted to ICU.  Nephrology consulted for hyponatremia.   Patient's care transferred to [facility] hospitalist service on 2/26.  Code stroke activated the morning of 2/26 due to LUE weakness.  CT head and MRI without acute finding.  MRI cervical spine with some foraminal stenosis.  Cardiology consulted for possible non-STEMI.  TTE with RWMA.    Subjective: Seen and examined earlier this morning.  No major events overnight of this morning.  Continues to endorse shortness of breath and dry cough.  Denies chest pain.  Denies GI or UTI symptoms.  Still with left arm weakness.  Objective: Vitals:   06/02/22 0436 06/02/22 0500 06/02/22 0843 06/02/22 0912  BP: 110/65   110/60  Pulse: 86   62  Resp: '20  16 18  '$ Temp: 98 F (36.7 C)   97.8 F (36.6 C)  TempSrc:    Oral   SpO2: 99%   100%  Weight:  52.1 kg    Height:        Examination:  GENERAL: No apparent distress.  Nontoxic. HEENT: MMM.  Vision and hearing grossly intact.  NECK: Supple.  No apparent JVD.  RESP:  No IWOB.  Diminished vision bilaterally. CVS:  RRR. Heart sounds normal.  ABD/GI/GU: BS+. Abd soft, NTND.  MSK/EXT:   No apparent deformity. Moves extremities. No edema.  SKIN: no apparent skin lesion or wound NEURO: Awake and alert. Oriented appropriately.  Motor 3+/5 in LUE.  Otherwise, no apparent focal neuro deficit PSYCH: Calm. Normal affect.   Procedures:  None  Microbiology summarized: [id number], influenza and RSV PCR nonreactive Blood cultures NGTD MRSA PCR screen negative  Assessment and plan: Principal Problem:   Hyponatremia Active Problems:   CAP (community acquired pneumonia)   Acute respiratory failure with hypercapnia (HCC)   NSTEMI (non-ST elevated myocardial infarction) ([place])   Left hemiparesis (HCC)   Tobacco use disorder   Physical deconditioning   Lactic acidosis   Bandemia   Severe sepsis (HCC)  Severe sepsis due to community-acquired pneumonia: POA.  Had leukocytosis, tachycardia with lactic acidosis and hypotension on presentation.  VBG suggests mild hypercapnia.  CTA chest as above.  MRSA PCR screen, blood culture, COVID-19, influenza and RSV PCR negative. -Wean oxygen as able -Continue ceftriaxone and Zithromax to complete 5 days course. -Continue Solu-Medrol given risk for COPD -Continue bronchodilators, mucolytic's and antitussive. -Incentive spirometry and OOB.  Acute respiratory failure  with hypercapnia: Due to COPD?Marland Kitchen  Has no formal diagnosis this but significant smoking history. -Management as above.  Severe hyponatremia: In the setting of poor p.o. intake and thiazide diuretics. Recent Labs  Lab 05/31/22 1629 05/31/22 2004 05/31/22 2109 05/31/22 2257 06/01/22 0458 06/01/22 0906 06/01/22 1751 06/01/22 2250 06/02/22 0510  06/02/22 1052  NA 110* 113* 114* 114* 117* 121* 123* 121* 124* 124*  -Appreciate help by nephrology.  IVF stopped due to rapid correction. -Continue monitoring sodium -Continue holding HCTZ. -Nephrology following.  NSTEMI?  Patient without chest pain.  EKG with nonspecific TW changes in lateral leads.  Troponin peaked at 1582 and started trending down.  Unclear if this is non-STEMI or demand ischemia in the setting of sepsis and pneumonia.  UDS negative.  TTE with preserved LVEF but RWMA.  A1c 5.8%.  LDL 39. -Cardiology following-IV heparin for 48 hours, low-dose aspirin and high intensity statin -Possible LHC once improved from pneumonia and electrolyte derangement.  Left hemiparesis: Noted to have left arm weakness on 2/26.  Last known normal more than 24 hours.  CT head and MRI brain without acute finding.  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 -Neurology recommends outpatient referral for EMG -PT/OT.   Essential hypertension: Soft blood pressures. -Continue holding HCTZ.  Fall at home: -Fall precaution.  Tobacco use disorder -Encourage smoking cessation.  Lactic acidosis: Likely due to #1. -Resolved.  Hypokalemia: Likely due to thiazide diuretics.  Resolved. -Monitor replenish as appropriate  Leukocytosis/bandemia: Likely due to #1 and steroid.  Improving. -Antibiotics as above -Continue monitoring   Underweight Body mass index is 17.46 kg/m. -Consulted dietitian          DVT prophylaxis:  SCDs Start: 05/31/22 1805 On full dose anticoagulation. Code Status: Full code Family Communication: None at bedside Level of care: Telemetry Cardiac Status is: Inpatient Remains inpatient appropriate because: Due to severe sepsis, possible non-STEMI,  left hemiparesis and hyponatremia   Final disposition: TBD Consultants:  Pulmonary Neurology Cardiology  55 minutes with more than 50% spent in reviewing records, counseling patient/family and  coordinating care.   Sch Meds:  Scheduled Meds:  aspirin EC  81 mg Oral Daily   atorvastatin  80 mg Oral Daily   guaiFENesin  600 mg Oral BID   methylPREDNISolone (SOLU-MEDROL) injection  40 mg Intravenous Daily   Continuous Infusions:  sodium chloride Stopped (06/01/22 1747)   azithromycin Stopped (06/01/22 1747)   cefTRIAXone (ROCEPHIN)  IV Stopped (06/01/22 1714)   heparin 900 Units/hr (06/01/22 2134)   potassium PHOSPHATE IVPB (in mmol) 30 mmol (06/02/22 0841)   PRN Meds:.sodium chloride, acetaminophen, docusate sodium, guaiFENesin-dextromethorphan, ipratropium-albuterol, ondansetron (ZOFRAN) IV, mouth rinse, polyethylene glycol  Antimicrobials: Anti-infectives (From admission, onward)    Start     Dose/Rate Route Frequency Ordered Stop   06/01/22 1700  cefTRIAXone (ROCEPHIN) 1 g in sodium chloride 0.9 % 100 mL IVPB        1 g 200 mL/hr over 30 Minutes Intravenous Every 24 hours 06/01/22 1025 06/05/22 1659   06/01/22 1700  azithromycin (ZITHROMAX) 500 mg in sodium chloride 0.9 % 250 mL IVPB        500 mg 250 mL/hr over 60 Minutes Intravenous Every 24 hours 06/01/22 1025 06/03/22 1659   05/31/22 1630  cefTRIAXone (ROCEPHIN) 1 g in sodium chloride 0.9 % 100 mL IVPB        1 g 200 mL/hr over 30 Minutes Intravenous  Once 05/31/22 1624 05/31/22 1713   05/31/22 1630  azithromycin (ZITHROMAX) 500 mg in sodium chloride 0.9 % 250 mL IVPB        500 mg 250 mL/hr over 60 Minutes Intravenous  Once 05/31/22 1624 05/31/22 1812        I have personally reviewed the following labs and images: CBC: Recent Labs  Lab 05/31/22 1456 06/01/22 0906 06/02/22 0510  WBC 27.7* 19.7* 22.2*  NEUTROABS  --  17.9* 19.4*  HGB 14.0 11.5* 10.9*  HCT RESULTS UNAVAILABLE DUE TO INTERFERING SUBSTANCE 32.2* 30.6*  MCV RESULTS UNAVAILABLE DUE TO INTERFERING SUBSTANCE 84.3 86.0  PLT 395 383 364   BMP &GFR Recent Labs  Lab 06/01/22 0458 06/01/22 0906 06/01/22 1751 06/01/22 2250 06/02/22 0510  06/02/22 1052  NA 117* 121* 123* 121* 124* 124*  K 3.4* 3.3* 3.6 3.4* 3.5 4.9  CL 80* 82* 85* 84* 85* 83*  CO2 '26 27 27 28 29 31  '$ GLUCOSE 85 77 155* 149* 128* 202*  BUN '9 9 8 9 '$ 7* 6*  CREATININE 0.60 0.59 0.55 0.45 0.39* 0.49  CALCIUM 7.9* 8.1* 7.9* 7.7* 8.0* 8.4*  MG 1.8  --   --   --  2.2  --   PHOS 3.2  --   --   --  1.7*  --    Estimated Creatinine Clearance: 59.2 mL/min (by C-G formula based on SCr of 0.49 mg/dL). Liver & Pancreas: Recent Labs  Lab 05/31/22 1628 06/01/22 0458 06/02/22 0510  AST '29 25 21  '$ ALT '14 14 14  '$ ALKPHOS 67 60 62  BILITOT 1.3* 1.0 0.8  PROT 6.4* 5.6* 5.6*  ALBUMIN 3.2* 2.7* 2.7*   Recent Labs  Lab 05/31/22 2109  LIPASE 59*   No results for input(s): "AMMONIA" in the last 168 hours. Diabetic: Recent Labs    06/01/22 0906  HGBA1C 5.8*   Recent Labs  Lab 05/31/22 1927  GLUCAP 77   Cardiac Enzymes: Recent Labs  Lab 06/02/22 0510  CKTOTAL 111   No results for input(s): "PROBNP" in the last 8760 hours. Coagulation Profile: Recent Labs  Lab 05/31/22 1458  INR 1.1   Thyroid Function Tests: Recent Labs    05/31/22 2109  TSH 0.184*  FREET4 1.23*   Lipid Profile: Recent Labs    06/02/22 0510  CHOL 98  HDL 51  LDLCALC 39  TRIG 39  CHOLHDL 1.9   Anemia Panel: No results for input(s): "VITAMINB12", "FOLATE", "FERRITIN", "TIBC", "IRON", "RETICCTPCT" in the last 72 hours. Urine analysis:    Component Value Date/Time   COLORURINE YELLOW (A) 05/31/2022 2115   APPEARANCEUR CLEAR (A) 05/31/2022 2115   LABSPEC 1.043 (H) 05/31/2022 2115   PHURINE 7.0 05/31/2022 2115   GLUCOSEU NEGATIVE 05/31/2022 2115   HGBUR NEGATIVE 05/31/2022 2115   BILIRUBINUR NEGATIVE 05/31/2022 2115   KETONESUR 20 (A) 05/31/2022 2115   PROTEINUR NEGATIVE 05/31/2022 2115   NITRITE NEGATIVE 05/31/2022 2115   LEUKOCYTESUR NEGATIVE 05/31/2022 2115   Sepsis Labs: Invalid input(s): "PROCALCITONIN", "LACTICIDVEN"  Microbiology: Recent Results (from the  past 240 hour(s))  Blood Culture (routine x 2)     Status: None (Preliminary result)   Collection Time: 05/31/22  2:58 PM   Specimen: BLOOD  Result Value Ref Range Status   Specimen Description BLOOD RAC  Final   Special Requests   Final    BOTTLES DRAWN AEROBIC AND ANAEROBIC Blood Culture results may not be optimal due to an inadequate volume of blood received in culture bottles   Culture   Final  NO GROWTH 2 DAYS Performed at [facility], [address] [place], [place] [address]    Report Status PENDING  Incomplete  Blood Culture (routine x 2)     Status: None (Preliminary result)   Collection Time: 05/31/22  3:03 PM   Specimen: BLOOD  Result Value Ref Range Status   Specimen Description BLOOD LAC  Final   Special Requests   Final    BOTTLES DRAWN AEROBIC AND ANAEROBIC Blood Culture adequate volume   Culture   Final    NO GROWTH 2 DAYS Performed at [facility], [address], [place], [place] [address]    Report Status PENDING  Incomplete  Resp panel by RT-PCR (RSV, Flu A&B, Covid) Anterior Nasal Swab     Status: None   Collection Time: 05/31/22  4:28 PM   Specimen: Anterior Nasal Swab  Result Value Ref Range Status   SARS Coronavirus 2 by RT PCR NEGATIVE NEGATIVE Final    Comment: (NOTE) SARS-CoV-2 target nucleic acids are NOT DETECTED.  The SARS-CoV-2 RNA is generally detectable in upper respiratory specimens during the acute phase of infection. The lowest concentration of SARS-CoV-2 viral copies this assay can detect is 138 copies/mL. A negative result does not preclude SARS-Cov-2 infection and should not be used as the sole basis for treatment or other patient management decisions. A negative result may occur with  improper specimen collection/handling, submission of specimen other than nasopharyngeal swab, presence of viral mutation(s) within the areas targeted by this assay, and inadequate number of viral copies(<138 copies/mL). A negative  result must be combined with clinical observations, patient history, and epidemiological information. The expected result is Negative.  Fact Sheet for Patients:  EntrepreneurPulse.com.au  Fact Sheet for Healthcare Providers:  IncredibleEmployment.be  This test is no t yet approved or cleared by the [place] FDA and  has been authorized for detection and/or diagnosis of SARS-CoV-2 by FDA under an Emergency Use Authorization (EUA). This EUA will remain  in effect (meaning this test can be used) for the duration of the COVID-19 declaration under Section 564(b)(1) of the Act, 21 U.S.C.section 360bbb-3(b)(1), unless the authorization is terminated  or revoked sooner.       Influenza A by PCR NEGATIVE NEGATIVE Final   Influenza B by PCR NEGATIVE NEGATIVE Final    Comment: (NOTE) The Xpert Xpress SARS-CoV-2/FLU/RSV plus assay is intended as an aid in the diagnosis of influenza from Nasopharyngeal swab specimens and should not be used as a sole basis for treatment. Nasal washings and aspirates are unacceptable for Xpert Xpress SARS-CoV-2/FLU/RSV testing.  Fact Sheet for Patients: EntrepreneurPulse.com.au  Fact Sheet for Healthcare Providers: IncredibleEmployment.be  This test is not yet approved or cleared by the [place] FDA and has been authorized for detection and/or diagnosis of SARS-CoV-2 by FDA under an Emergency Use Authorization (EUA). This EUA will remain in effect (meaning this test can be used) for the duration of the COVID-19 declaration under Section 564(b)(1) of the Act, 21 U.S.C. section 360bbb-3(b)(1), unless the authorization is terminated or revoked.     Resp Syncytial Virus by PCR NEGATIVE NEGATIVE Final    Comment: (NOTE) Fact Sheet for Patients: EntrepreneurPulse.com.au  Fact Sheet for Healthcare Providers: IncredibleEmployment.be  This  test is not yet approved or cleared by the [place] FDA and has been authorized for detection and/or diagnosis of SARS-CoV-2 by FDA under an Emergency Use Authorization (EUA). This EUA will remain in effect (meaning this test can be used) for the  duration of the COVID-19 declaration under Section 564(b)(1) of the Act, 21 U.S.C. section 360bbb-3(b)(1), unless the authorization is terminated or revoked.  Performed at [facility], [address] [place], [place] [address]   MRSA Next Gen by PCR, Nasal     Status: None   Collection Time: 05/31/22  9:11 PM   Specimen: Nasal Mucosa; Nasal Swab  Result Value Ref Range Status   MRSA by PCR Next Gen NOT DETECTED NOT DETECTED Final    Comment: (NOTE) The GeneXpert MRSA Assay (FDA approved for NASAL specimens only), is one component of a comprehensive MRSA colonization surveillance program. It is not intended to diagnose MRSA infection nor to guide or monitor treatment for MRSA infections. Test performance is not FDA approved in patients less than 28 years old. Performed at [facility], [address], [place], [place] [address]     Radiology Studies: ECHOCARDIOGRAM COMPLETE  Result Date: 06/01/2022    ECHOCARDIOGRAM REPORT   Patient Name:   [facility] Date of Exam: 06/01/2022 Medical Rec #:  [id number]       Height:       68.0 in Accession #:    [id number]      Weight:       108.7 lb Date of Birth:  11-Nov-1960       BSA:          1.577 m Patient Age:    68 years        BP:           103/63 mmHg Patient Gender: F               HR:           87 bpm. Exam Location:  [facility] Procedure: 2D Echo, Cardiac Doppler and Color Doppler Indications:     Non- STEMI  History:         Patient has no prior history of Echocardiogram examinations.                  Risk Factors:Hypertension.  Sonographer:     Sherrie Sport Referring Phys:  [id number] Charlesetta Ivory Esmond Hinch Diagnosing Phys: Kate Sable MD  Sonographer Comments: Suboptimal  parasternal window. IMPRESSIONS  1. Left ventricular ejection fraction, by estimation, is 55%. The left ventricle has normal function. The left ventricle demonstrates regional wall motion abnormalities (see scoring diagram/findings for description). Left ventricular diastolic parameters are consistent with Grade I diastolic dysfunction (impaired relaxation). There is moderate hypokinesis of the left ventricular, mid-apical anteroseptal wall.  2. Right ventricular systolic function is normal. The right ventricular size is normal.  3. The mitral valve is normal in structure. No evidence of mitral valve regurgitation.  4. The aortic valve is tricuspid. Aortic valve regurgitation is not visualized. FINDINGS  Left Ventricle: Left ventricular ejection fraction, by estimation, is 55%. The left ventricle has normal function. The left ventricle demonstrates regional wall motion abnormalities. Moderate hypokinesis of the left ventricular, mid-apical anteroseptal wall. The left ventricular internal cavity size was normal in size. There is no left ventricular hypertrophy. Left ventricular diastolic parameters are consistent with Grade I diastolic dysfunction (impaired relaxation). Right Ventricle: The right ventricular size is normal. No increase in right ventricular wall thickness. Right ventricular systolic function is normal. Left Atrium: Left atrial size was normal in size. Right Atrium: Right atrial size was normal in size. Pericardium: There is no evidence of pericardial effusion. Mitral Valve: The mitral valve is normal in structure. No evidence of  mitral valve regurgitation. Tricuspid Valve: The tricuspid valve is normal in structure. Tricuspid valve regurgitation is not demonstrated. Aortic Valve: The aortic valve is tricuspid. Aortic valve regurgitation is not visualized. Aortic valve mean gradient measures 2.0 mmHg. Aortic valve peak gradient measures 3.5 mmHg. Aortic valve area, by VTI measures 2.39 cm. Pulmonic  Valve: The pulmonic valve was not well visualized. Pulmonic valve regurgitation is not visualized. Aorta: The aortic root is normal in size and structure. Venous: The inferior vena cava was not well visualized. IAS/Shunts: No atrial level shunt detected by color flow Doppler.  LEFT VENTRICLE PLAX 2D LVIDd:         3.80 cm     Diastology LVIDs:         2.60 cm     LV e' medial:    6.96 cm/s LV PW:         1.00 cm     LV E/e' medial:  9.8 LV IVS:        1.10 cm     LV e' lateral:   10.30 cm/s LVOT diam:     1.90 cm     LV E/e' lateral: 6.6 LV SV:         43 LV SV Index:   27 LVOT Area:     2.84 cm  LV Volumes (MOD) LV vol d, MOD A2C: 51.3 ml LV vol d, MOD A4C: 72.3 ml LV vol s, MOD A2C: 30.1 ml LV vol s, MOD A4C: 39.6 ml LV SV MOD A2C:     21.2 ml LV SV MOD A4C:     72.3 ml LV SV MOD BP:      27.8 ml RIGHT VENTRICLE RV Basal diam:  3.30 cm RV Mid diam:    2.10 cm RV S prime:     13.60 cm/s LEFT ATRIUM             Index       RIGHT ATRIUM          Index LA diam:        2.00 cm 1.27 cm/m  RA Area:     7.02 cm LA Vol (A2C):   11.7 ml 7.42 ml/m  RA Volume:   11.60 ml 7.36 ml/m LA Vol (A4C):   9.2 ml  5.83 ml/m LA Biplane Vol: 10.5 ml 6.66 ml/m  AORTIC VALVE AV Area (Vmax):    2.62 cm AV Area (Vmean):   2.64 cm AV Area (VTI):     2.39 cm AV Vmax:           93.30 cm/s AV Vmean:          66.300 cm/s AV VTI:            0.178 m AV Peak Grad:      3.5 mmHg AV Mean Grad:      2.0 mmHg LVOT Vmax:         86.20 cm/s LVOT Vmean:        61.800 cm/s LVOT VTI:          0.150 m LVOT/AV VTI ratio: 0.84  AORTA Ao Root diam: 2.80 cm MITRAL VALVE               TRICUSPID VALVE MV Area (PHT): 6.17 cm    TR Peak grad:   18.1 mmHg MV Decel Time: 123 msec    TR Vmax:        213.00 cm/s MV E velocity: 68.10 cm/s MV A velocity:  96.40 cm/s  SHUNTS MV E/A ratio:  0.71        Systemic VTI:  0.15 m                            Systemic Diam: 1.90 cm Kate Sable MD Electronically signed by Kate Sable MD Signature Date/Time:  06/01/2022/4:07:32 PM    Final       Ilisa Hayworth T. [place]  If 7PM-7AM, please contact night-coverage [email or web address] 06/02/2022, 1:03 PM

## 2022-06-02 NOTE — Consult Note (Signed)
ANTICOAGULATION CONSULT NOTE  Pharmacy Consult for heparin Indication: ACS/STEMI  No Known Allergies  Patient Measurements: Height: '5\' 8"'$  ([id number] cm) Weight: 49.3 kg (108 lb 11 oz) IBW/kg (Calculated) : 63.9 Heparin Dosing Weight: 47.6 kg  Vital Signs: Temp: 98.1 F (36.7 C) (02/27 0205) Temp Source: Oral (02/27 0205) BP: 106/64 (02/27 0205) Pulse Rate: 85 (02/27 0205)  Labs: Recent Labs    05/31/22 1456 05/31/22 1458 05/31/22 1628 05/31/22 1628 05/31/22 1629 05/31/22 2004 06/01/22 0906 06/01/22 1751 06/01/22 2250 06/02/22 0148  HGB 14.0  --   --   --   --   --  11.5*  --   --   --   HCT RESULTS UNAVAILABLE DUE TO INTERFERING SUBSTANCE  --   --   --   --   --  32.2*  --   --   --   PLT 395  --   --   --   --   --  383  --   --   --   APTT  --  29  --   --   --   --   --   --   --   --   LABPROT  --  13.9  --   --   --   --   --   --   --   --   INR  --  1.1  --   --   --   --   --   --   --   --   HEPARINUNFRC  --   --   --   --   --    < > 0.10* <0.10*  --  0.35  CREATININE  --   --  0.68   < > 0.64   < > 0.59 0.55 0.45  --   TROPONINIHS 935*  --  1,582*  --  1,011*  --   --   --   --   --    < > = values in this interval not displayed.     Estimated Creatinine Clearance: 56 mL/min (by C-G formula based on SCr of 0.45 mg/dL).   Medical History: Past Medical History:  Diagnosis Date   Hypertension     Medications:  No evidence of PTA anticoagulation  Assessment: Pharmacy consulted to dose heparin in this 62 yo F who presents to the ED with SOB/chest pain.  Troponin 935.  Baseline Labs: aPTT 29, INR 1.1  2/26 1750 HL < 0.10 Subtherapeutic  2/27 0148 HL = 0.35, therapeutic X 1   Goal of Therapy:  Heparin level 0.3-0.7 units/ml Monitor platelets by anticoagulation protocol: Yes  Plan:  2/27:  HL @ 0148 = 0.35, therapeutic X 1  Will continue pt on current rate and recheck HL on 2/27 @ 0800.   CBC daily while on heparin  Esbeidy Mclaine D,  PharmD Clinical Pharmacist   06/02/2022 3:46 AM

## 2022-06-02 NOTE — Progress Notes (Signed)
Progress Note  Patient Name: Katie Moody Date of Encounter: 06/02/2022  Primary Cardiologist: new consult by End  Subjective   No chest pain, dyspnea, palpitations, dizziness, presyncope, or syncope.  Inpatient Medications    Scheduled Meds:  aspirin EC  81 mg Oral Daily   atorvastatin  80 mg Oral Daily   guaiFENesin  600 mg Oral BID   methylPREDNISolone (SOLU-MEDROL) injection  40 mg Intravenous Daily   Continuous Infusions:  sodium chloride Stopped (06/01/22 1747)   azithromycin Stopped (06/01/22 1747)   cefTRIAXone (ROCEPHIN)  IV Stopped (06/01/22 1714)   heparin 900 Units/hr (06/01/22 2134)   potassium PHOSPHATE IVPB (in mmol) 30 mmol (06/02/22 0841)   PRN Meds: sodium chloride, acetaminophen, docusate sodium, guaiFENesin-dextromethorphan, ipratropium-albuterol, ondansetron (ZOFRAN) IV, mouth rinse, polyethylene glycol   Vital Signs    Vitals:   06/02/22 0436 06/02/22 0500 06/02/22 0843 06/02/22 0912  BP: 110/65   110/60  Pulse: 86   62  Resp: '20  16 18  '$ Temp: 98 F (36.7 C)   97.8 F (36.6 C)  TempSrc:    Oral  SpO2: 99%   100%  Weight:  52.1 kg    Height:        Intake/Output Summary (Last 24 hours) at 06/02/2022 1250 Last data filed at 06/02/2022 1113 Gross per 24 hour  Intake 756.59 ml  Output 500 ml  Net 256.59 ml   Filed Weights   05/31/22 1920 06/01/22 0500 06/02/22 0500  Weight: 49.3 kg 49.3 kg 52.1 kg    Telemetry    Sinus rhythm with rare PVCs and T wave inversion - Personally Reviewed  ECG    No new tracings - Personally Reviewed  Physical Exam   GEN: No acute distress.   Neck: No JVD. Cardiac: RRR, no murmurs, rubs, or gallops.  Respiratory: Clear to auscultation bilaterally.  GI: Soft, nontender, non-distended.   MS: No edema; No deformity.   Neuro:  Alert and oriented x 3. Able to lift left upper extremity onto meal tray. Psych: Normal affect.  Labs    Chemistry Recent Labs  Lab 05/31/22 1628 05/31/22 1629  06/01/22 0458 06/01/22 0906 06/01/22 2250 06/02/22 0510 06/02/22 1052  NA 110*   < > 117*   < > 121* 124* 124*  K 2.8*   < > 3.4*   < > 3.4* 3.5 4.9  CL <65*   < > 80*   < > 84* 85* 83*  CO2 33*   < > 26   < > '28 29 31  '$ GLUCOSE 101*   < > 85   < > 149* 128* 202*  BUN 10   < > 9   < > 9 7* 6*  CREATININE 0.68   < > 0.60   < > 0.45 0.39* 0.49  CALCIUM 8.4*   < > 7.9*   < > 7.7* 8.0* 8.4*  PROT 6.4*  --  5.6*  --   --  5.6*  --   ALBUMIN 3.2*  --  2.7*  --   --  2.7*  --   AST 29  --  25  --   --  21  --   ALT 14  --  14  --   --  14  --   ALKPHOS 67  --  60  --   --  62  --   BILITOT 1.3*  --  1.0  --   --  0.8  --   GFRNONAA >  60   < > >60   < > >60 >60 >60  ANIONGAP NOT CALCULATED   < > 11   < > '9 10 10   '$ < > = values in this interval not displayed.     Hematology Recent Labs  Lab 05/31/22 1456 06/01/22 0906 06/02/22 0510  WBC 27.7* 19.7* 22.2*  RBC RESULTS UNAVAILABLE DUE TO INTERFERING SUBSTANCE 3.82* 3.56*  HGB 14.0 11.5* 10.9*  HCT RESULTS UNAVAILABLE DUE TO INTERFERING SUBSTANCE 32.2* 30.6*  MCV RESULTS UNAVAILABLE DUE TO INTERFERING SUBSTANCE 84.3 86.0  MCH RESULTS UNAVAILABLE DUE TO INTERFERING SUBSTANCE 30.1 30.6  MCHC RESULTS UNAVAILABLE DUE TO INTERFERING SUBSTANCE 35.7 35.6  RDW RESULTS UNAVAILABLE DUE TO INTERFERING SUBSTANCE 11.2* 11.3*  PLT 395 383 364    Cardiac EnzymesNo results for input(s): "TROPONINI" in the last 168 hours. No results for input(s): "TROPIPOC" in the last 168 hours.   BNPNo results for input(s): "BNP", "PROBNP" in the last 168 hours.   DDimer No results for input(s): "DDIMER" in the last 168 hours.   Radiology    MR BRAIN WO CONTRAST  Result Date: 06/01/2022 IMPRESSION: MRI brain: 1.  No evidence of an acute intracranial abnormality. 2. Mild chronic small vessel ischemic changes within the cerebral white matter. 3. Mild generalized cerebral atrophy. 4. Mild mucosal thickening within the left maxillary sinus. 5. Bilateral mastoid  effusions. MRI cervical spine: 1. Motion degraded examination, limiting evaluation. 2. Cervical spondylosis, as outlined. 3. No more than mild spinal canal stenosis. 4. Multilevel foraminal stenosis, as detailed and greatest on the right at C4-C5 (moderate) and bilaterally at C5-C6 (severe right, moderate/severe left). 5. Disc degeneration is greatest at C4-C5 and C5-C6 (moderate at these levels). Electronically Signed   By: Kellie Simmering D.O.   On: 06/01/2022 12:00   MR CERVICAL SPINE WO CONTRAST  Result Date: 06/01/2022 IMPRESSION: MRI brain: 1.  No evidence of an acute intracranial abnormality. 2. Mild chronic small vessel ischemic changes within the cerebral white matter. 3. Mild generalized cerebral atrophy. 4. Mild mucosal thickening within the left maxillary sinus. 5. Bilateral mastoid effusions. MRI cervical spine: 1. Motion degraded examination, limiting evaluation. 2. Cervical spondylosis, as outlined. 3. No more than mild spinal canal stenosis. 4. Multilevel foraminal stenosis, as detailed and greatest on the right at C4-C5 (moderate) and bilaterally at C5-C6 (severe right, moderate/severe left). 5. Disc degeneration is greatest at C4-C5 and C5-C6 (moderate at these levels). Electronically Signed   By: Kellie Simmering D.O.   On: 06/01/2022 12:00   CT HEAD CODE STROKE WO CONTRAST`  Addendum Date: 06/01/2022   ADDENDUM REPORT: 06/01/2022 08:16 ADDENDUM: Study discussed by telephone with NP Donell Beers on 06/01/2022 at 0757 hours. Electronically Signed   By: Genevie Ann M.D.   On: 06/01/2022 08:16   Result Date: 06/01/2022 IMPRESSION: 1. Normal for age non contrast CT appearance of the brain. ASPECTS 10. 2. Left mastoid effusion, likely postinflammatory and significance doubtful. Electronically Signed: By: Genevie Ann M.D. On: 06/01/2022 07:53   CT Angio Chest PE W/Cm &/Or Wo Cm  Result Date: 05/31/2022 IMPRESSION: 1. No pulmonary embolism is seen. 2. There is opacification of the bilateral lower lobe  posterior and lateral segmental airways, possibly from mucous plugging. There is associated bilateral lower lobe posterior and lateral segmental mild bronchial wall thickening and mild bronchiectasis. 3. There are scattered centrilobular and tree-in-bud airspace opacities within the bilateral lower lobes, medial segment of the right middle lobe, and anteromedial left upper lobe. This  suggests mild multifocal airways disseminated infection, possibly chronic, and possibly related to aspiration. 4. There is reflux of contrast into the inferior vena cava and hepatic veins, possibly from right heart insufficiency. Electronically Signed   By: Yvonne Kendall M.D.   On: 05/31/2022 18:47   DG Chest Port 1 View  Result Date: 05/31/2022 IMPRESSION: 1. No focal airspace opacity. 2. Visualized upper abdomen is notable for colonic gaseous distention. If there is clinical concern for abdominal pathology, further evaluation with a dedicated abdominal radiograph is recommended. Electronically Signed   By: Marin Roberts M.D.   On: 05/31/2022 14:23    Cardiac Studies   2D echo 06/01/2022: 1. Left ventricular ejection fraction, by estimation, is 55%. The left  ventricle has normal function. The left ventricle demonstrates regional  wall motion abnormalities (see scoring diagram/findings for description).  Left ventricular diastolic  parameters are consistent with Grade I diastolic dysfunction (impaired  relaxation). There is moderate hypokinesis of the left ventricular,  mid-apical anteroseptal wall.   2. Right ventricular systolic function is normal. The right ventricular  size is normal.   3. The mitral valve is normal in structure. No evidence of mitral valve  regurgitation.   4. The aortic valve is tricuspid. Aortic valve regurgitation is not  visualized.   Patient Profile     62 y.o. female with history of HTN and tobacco use who is being seen today for the evaluation of elevated troponin at the request of  Dr. Cyndia Skeeters.   Assessment & Plan    1. 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, or wall motion abnormality noted on echo is not typical for this -Echo with preserved LV systolic function with moderate hypokinesis of the left ventricular mid apical, anteroseptal wall -Complete 48 hours of heparin -ASA -Favor proceeding with cardiac cath once her respiratory status and electrolyte derangements are improved, likely 06/04/2022 -Defer addition of beta-blocker or ACE inhibitor given periodic hypotension -LDL 39, on atorvastatin   Remaining comorbidities and electrolyte abnormalities per primary service.       For questions or updates, please contact [place] Please consult [email or web address] for contact info under Cardiology/STEMI.    Signed, Christell Faith, PA-C [place] Pager: [phone] 06/02/2022, 12:50 PM

## 2022-06-02 NOTE — Progress Notes (Signed)
Occupational Therapy Treatment Patient Details Name: Katie Moody MRN: [id number] DOB: 03/31/1961 Today's Date: 06/02/2022   History of present illness Pt is a 62 year old F with PMH of HTN and tobacco use disorder presenting with shortness of breath and productive cough for 5 days and generalized weakness for 2 days, dizzy and fall without trauma or loss of consciousness. MD assessment includes: Acute respiratory failure with hypercapnia, severe hyponatremia, NSTEMI, hypokalemia, noted LUE weakness with MRI negative for stroke, and lactic acidosis.   OT comments  Upon entering the room, pt supine in bed with no c/o pain and needing maximal encouragement for participation this session. Pt performs bed mobility with min A and stands to take side steps with min A and use of RW. Pt refuses to transfer to [facility] or recliner chair. Pt stands for ~ 3-4 minutes and OT notices that pt has had BM. Pt returns to supine in bed and needs total A for hygiene and to doff soiled clothing. Recommendation changed to SNF this session secondary to increased assistance needed for self care tasks. Pt with no significant weakness noted on L side during session but general weakness throughout.    Recommendations for follow up therapy are one component of a multi-disciplinary discharge planning process, led by the attending physician.  Recommendations may be updated based on patient status, additional functional criteria and insurance authorization.    Follow Up Recommendations  Skilled nursing-short term rehab (<3 hours/day)     Assistance Recommended at Discharge Intermittent Supervision/Assistance  Patient can return home with the following  A little help with walking and/or transfers;A little help with bathing/dressing/bathroom;Help with stairs or ramp for entrance;Assist for transportation;Assistance with cooking/housework   Equipment Recommendations  Other (comment) (RW)       Precautions / Restrictions  Precautions Precautions: Fall       Mobility Bed Mobility Overal bed mobility: Needs Assistance Bed Mobility: Supine to Sit, Sit to Supine     Supine to sit: Min assist Sit to supine: Min assist        Transfers Overall transfer level: Needs assistance Equipment used: Rolling walker (2 wheels) Transfers: Sit to/from Stand Sit to Stand: Min assist                 Balance Overall balance assessment: Needs assistance Sitting-balance support: Feet supported Sitting balance-Leahy Scale: Good     Standing balance support: During functional activity, Bilateral upper extremity supported Standing balance-Leahy Scale: Fair                             ADL either performed or assessed with clinical judgement   ADL Overall ADL's : Needs assistance/impaired                                       General ADL Comments: total A for hygiene after BM and to doff soiled brief    Extremity/Trunk Assessment Upper Extremity Assessment Upper Extremity Assessment: Generalized weakness   Lower Extremity Assessment Lower Extremity Assessment: Generalized weakness        Vision Patient Visual Report: No change from baseline            Cognition Arousal/Alertness: Awake/alert Behavior During Therapy: WFL for tasks assessed/performed Overall Cognitive Status: Within Functional Limits for tasks assessed  Pertinent Vitals/ Pain       Pain Assessment Pain Assessment: No/denies pain         Frequency  Min 2X/week        Progress Toward Goals  OT Goals(current goals can now be found in the care plan section)  Progress towards OT goals: Progressing toward goals     Plan Discharge plan remains appropriate;Frequency remains appropriate       AM-PAC OT "6 Clicks" Daily Activity     Outcome Measure   Help from another person eating meals?: None Help from another  person taking care of personal grooming?: None Help from another person toileting, which includes using toliet, bedpan, or urinal?: A Lot Help from another person bathing (including washing, rinsing, drying)?: A Lot Help from another person to put on and taking off regular upper body clothing?: A Little Help from another person to put on and taking off regular lower body clothing?: A Lot 6 Click Score: 17    End of Session Equipment Utilized During Treatment: Rolling walker (2 wheels)  OT Visit Diagnosis: Unsteadiness on feet (R26.81);Repeated falls (R29.6);Muscle weakness (generalized) (M62.81)   Activity Tolerance Patient tolerated treatment well   Patient Left in bed;with call bell/phone within reach;with bed alarm set   Nurse Communication Mobility status        Time: [id number] OT Time Calculation (min): 20 min  Charges: OT General Charges $OT Visit: 1 Visit OT Treatments $Self Care/Home Management : 8-22 mins  Darleen Crocker, MS, OTR/L , CBIS ascom [phone]  06/02/22, 3:46 PM

## 2022-06-02 NOTE — Progress Notes (Signed)
[facility]  ROUNDING NOTE   Subjective:   Patient seen sitting up in bed Ordering breakfast, continues to have poor appetite Remains on 2L  Continues to complain of weakness  Sodium 124  Objective:  Vital signs in last 24 hours:  Temp:  [97.8 F (36.6 C)-98.4 F (36.9 C)] 97.8 F (36.6 C) (02/27 0912) Pulse Rate:  [62-106] 62 (02/27 0912) Resp:  [16-21] 18 (02/27 0912) BP: (93-111)/(60-70) 110/60 (02/27 0912) SpO2:  [95 %-100 %] 100 % (02/27 0912) Weight:  [52.1 kg] 52.1 kg (02/27 0500)  Weight change: 4.472 kg Filed Weights   05/31/22 1920 06/01/22 0500 06/02/22 0500  Weight: 49.3 kg 49.3 kg 52.1 kg    Intake/Output: I/O last 3 completed shifts: In: 3030.9 [P.O.:238; I.V.:1886; IV Piggyback:906.9] Out: 1150 [Urine:1150]   Intake/Output this shift:  No intake/output data recorded.  Physical Exam: General: NAD  Head: Normocephalic, atraumatic. Moist oral mucosal membranes  Eyes: Anicteric  Lungs:  Clear to auscultation, normal effort  Heart: Regular rate and rhythm  Abdomen:  Soft, nontender  Extremities:  No peripheral edema.  Neurologic: Alert and oriented, moving all four extremities  Skin: No lesions  Access: None    Basic Metabolic Panel: Recent Labs  Lab 06/01/22 0458 06/01/22 0906 06/01/22 1751 06/01/22 2250 06/02/22 0510  NA 117* 121* 123* 121* 124*  K 3.4* 3.3* 3.6 3.4* 3.5  CL 80* 82* 85* 84* 85*  CO2 '26 27 27 28 29  '$ GLUCOSE 85 77 155* 149* 128*  BUN '9 9 8 9 '$ 7*  CREATININE 0.60 0.59 0.55 0.45 0.39*  CALCIUM 7.9* 8.1* 7.9* 7.7* 8.0*  MG 1.8  --   --   --  2.2  PHOS 3.2  --   --   --  1.7*    Liver Function Tests: Recent Labs  Lab 05/31/22 1628 06/01/22 0458 06/02/22 0510  AST '29 25 21  '$ ALT '14 14 14  '$ ALKPHOS 67 60 62  BILITOT 1.3* 1.0 0.8  PROT 6.4* 5.6* 5.6*  ALBUMIN 3.2* 2.7* 2.7*   Recent Labs  Lab 05/31/22 2109  LIPASE 59*   No results for input(s): "AMMONIA" in the last 168 hours.  CBC: Recent Labs   Lab 05/31/22 1456 06/01/22 0906 06/02/22 0510  WBC 27.7* 19.7* 22.2*  NEUTROABS  --  17.9* 19.4*  HGB 14.0 11.5* 10.9*  HCT RESULTS UNAVAILABLE DUE TO INTERFERING SUBSTANCE 32.2* 30.6*  MCV RESULTS UNAVAILABLE DUE TO INTERFERING SUBSTANCE 84.3 86.0  PLT 395 383 364    Cardiac Enzymes: Recent Labs  Lab 06/02/22 0510  CKTOTAL 111    BNP: Invalid input(s): "POCBNP"  CBG: Recent Labs  Lab 05/31/22 1927  GLUCAP 64    Microbiology: Results for orders placed or performed during the hospital encounter of 05/31/22  Blood Culture (routine x 2)     Status: None (Preliminary result)   Collection Time: 05/31/22  2:58 PM   Specimen: BLOOD  Result Value Ref Range Status   Specimen Description BLOOD RAC  Final   Special Requests   Final    BOTTLES DRAWN AEROBIC AND ANAEROBIC Blood Culture results may not be optimal due to an inadequate volume of blood received in culture bottles   Culture   Final    NO GROWTH 2 DAYS Performed at [facility], [address] [place], [place] [address]    Report Status PENDING  Incomplete  Blood Culture (routine x 2)     Status: None (Preliminary result)  Collection Time: 05/31/22  3:03 PM   Specimen: BLOOD  Result Value Ref Range Status   Specimen Description BLOOD LAC  Final   Special Requests   Final    BOTTLES DRAWN AEROBIC AND ANAEROBIC Blood Culture adequate volume   Culture   Final    NO GROWTH 2 DAYS Performed at [facility], [address], [place], [place] [address]    Report Status PENDING  Incomplete  Resp panel by RT-PCR (RSV, Flu A&B, Covid) Anterior Nasal Swab     Status: None   Collection Time: 05/31/22  4:28 PM   Specimen: Anterior Nasal Swab  Result Value Ref Range Status   SARS Coronavirus 2 by RT PCR NEGATIVE NEGATIVE Final    Comment: (NOTE) SARS-CoV-2 target nucleic acids are NOT DETECTED.  The SARS-CoV-2 RNA is generally detectable in upper respiratory specimens during the acute phase of  infection. The lowest concentration of SARS-CoV-2 viral copies this assay can detect is 138 copies/mL. A negative result does not preclude SARS-Cov-2 infection and should not be used as the sole basis for treatment or other patient management decisions. A negative result may occur with  improper specimen collection/handling, submission of specimen other than nasopharyngeal swab, presence of viral mutation(s) within the areas targeted by this assay, and inadequate number of viral copies(<138 copies/mL). A negative result must be combined with clinical observations, patient history, and epidemiological information. The expected result is Negative.  Fact Sheet for Patients:  EntrepreneurPulse.com.au  Fact Sheet for Healthcare Providers:  IncredibleEmployment.be  This test is no t yet approved or cleared by the [place] FDA and  has been authorized for detection and/or diagnosis of SARS-CoV-2 by FDA under an Emergency Use Authorization (EUA). This EUA will remain  in effect (meaning this test can be used) for the duration of the COVID-19 declaration under Section 564(b)(1) of the Act, 21 U.S.C.section 360bbb-3(b)(1), unless the authorization is terminated  or revoked sooner.       Influenza A by PCR NEGATIVE NEGATIVE Final   Influenza B by PCR NEGATIVE NEGATIVE Final    Comment: (NOTE) The Xpert Xpress SARS-CoV-2/FLU/RSV plus assay is intended as an aid in the diagnosis of influenza from Nasopharyngeal swab specimens and should not be used as a sole basis for treatment. Nasal washings and aspirates are unacceptable for Xpert Xpress SARS-CoV-2/FLU/RSV testing.  Fact Sheet for Patients: EntrepreneurPulse.com.au  Fact Sheet for Healthcare Providers: IncredibleEmployment.be  This test is not yet approved or cleared by the [place] FDA and has been authorized for detection and/or diagnosis of SARS-CoV-2  by FDA under an Emergency Use Authorization (EUA). This EUA will remain in effect (meaning this test can be used) for the duration of the COVID-19 declaration under Section 564(b)(1) of the Act, 21 U.S.C. section 360bbb-3(b)(1), unless the authorization is terminated or revoked.     Resp Syncytial Virus by PCR NEGATIVE NEGATIVE Final    Comment: (NOTE) Fact Sheet for Patients: EntrepreneurPulse.com.au  Fact Sheet for Healthcare Providers: IncredibleEmployment.be  This test is not yet approved or cleared by the [place] FDA and has been authorized for detection and/or diagnosis of SARS-CoV-2 by FDA under an Emergency Use Authorization (EUA). This EUA will remain in effect (meaning this test can be used) for the duration of the COVID-19 declaration under Section 564(b)(1) of the Act, 21 U.S.C. section 360bbb-3(b)(1), unless the authorization is terminated or revoked.  Performed at [facility], [address] [place],  [address]   MRSA Next Gen  by PCR, Nasal     Status: None   Collection Time: 05/31/22  9:11 PM   Specimen: Nasal Mucosa; Nasal Swab  Result Value Ref Range Status   MRSA by PCR Next Gen NOT DETECTED NOT DETECTED Final    Comment: (NOTE) The GeneXpert MRSA Assay (FDA approved for NASAL specimens only), is one component of a comprehensive MRSA colonization surveillance program. It is not intended to diagnose MRSA infection nor to guide or monitor treatment for MRSA infections. Test performance is not FDA approved in patients less than 77 years old. Performed at [facility], [address] [place],  [address]     Coagulation Studies: Recent Labs    05/31/22 1458  LABPROT 13.9  INR 1.1    Urinalysis: Recent Labs    05/31/22 2115  COLORURINE YELLOW*  LABSPEC 1.043*  PHURINE 7.0  GLUCOSEU NEGATIVE  HGBUR NEGATIVE  BILIRUBINUR NEGATIVE  KETONESUR 20*  PROTEINUR NEGATIVE   NITRITE NEGATIVE  LEUKOCYTESUR NEGATIVE      Imaging: ECHOCARDIOGRAM COMPLETE  Result Date: 06/01/2022    ECHOCARDIOGRAM REPORT   Patient Name:   [facility] Date of Exam: 06/01/2022 Medical Rec #:  [id number]       Height:       68.0 in Accession #:    [id number]      Weight:       108.7 lb Date of Birth:  May 30, 1960       BSA:          1.577 m Patient Age:    87 years        BP:           103/63 mmHg Patient Gender: F               HR:           87 bpm. Exam Location:  [facility] Procedure: 2D Echo, Cardiac Doppler and Color Doppler Indications:     Non- STEMI  History:         Patient has no prior history of Echocardiogram examinations.                  Risk Factors:Hypertension.  Sonographer:     Sherrie Sport Referring Phys:  [id number] Charlesetta Ivory GONFA Diagnosing Phys: Kate Sable MD  Sonographer Comments: Suboptimal parasternal window. IMPRESSIONS  1. Left ventricular ejection fraction, by estimation, is 55%. The left ventricle has normal function. The left ventricle demonstrates regional wall motion abnormalities (see scoring diagram/findings for description). Left ventricular diastolic parameters are consistent with Grade I diastolic dysfunction (impaired relaxation). There is moderate hypokinesis of the left ventricular, mid-apical anteroseptal wall.  2. Right ventricular systolic function is normal. The right ventricular size is normal.  3. The mitral valve is normal in structure. No evidence of mitral valve regurgitation.  4. The aortic valve is tricuspid. Aortic valve regurgitation is not visualized. FINDINGS  Left Ventricle: Left ventricular ejection fraction, by estimation, is 55%. The left ventricle has normal function. The left ventricle demonstrates regional wall motion abnormalities. Moderate hypokinesis of the left ventricular, mid-apical anteroseptal wall. The left ventricular internal cavity size was normal in size. There is no left ventricular hypertrophy. Left ventricular diastolic  parameters are consistent with Grade I diastolic dysfunction (impaired relaxation). Right Ventricle: The right ventricular size is normal. No increase in right ventricular wall thickness. Right ventricular systolic function is normal. Left Atrium: Left atrial size was normal in size. Right Atrium: Right atrial size was normal in  size. Pericardium: There is no evidence of pericardial effusion. Mitral Valve: The mitral valve is normal in structure. No evidence of mitral valve regurgitation. Tricuspid Valve: The tricuspid valve is normal in structure. Tricuspid valve regurgitation is not demonstrated. Aortic Valve: The aortic valve is tricuspid. Aortic valve regurgitation is not visualized. Aortic valve mean gradient measures 2.0 mmHg. Aortic valve peak gradient measures 3.5 mmHg. Aortic valve area, by VTI measures 2.39 cm. Pulmonic Valve: The pulmonic valve was not well visualized. Pulmonic valve regurgitation is not visualized. Aorta: The aortic root is normal in size and structure. Venous: The inferior vena cava was not well visualized. IAS/Shunts: No atrial level shunt detected by color flow Doppler.  LEFT VENTRICLE PLAX 2D LVIDd:         3.80 cm     Diastology LVIDs:         2.60 cm     LV e' medial:    6.96 cm/s LV PW:         1.00 cm     LV E/e' medial:  9.8 LV IVS:        1.10 cm     LV e' lateral:   10.30 cm/s LVOT diam:     1.90 cm     LV E/e' lateral: 6.6 LV SV:         43 LV SV Index:   27 LVOT Area:     2.84 cm  LV Volumes (MOD) LV vol d, MOD A2C: 51.3 ml LV vol d, MOD A4C: 72.3 ml LV vol s, MOD A2C: 30.1 ml LV vol s, MOD A4C: 39.6 ml LV SV MOD A2C:     21.2 ml LV SV MOD A4C:     72.3 ml LV SV MOD BP:      27.8 ml RIGHT VENTRICLE RV Basal diam:  3.30 cm RV Mid diam:    2.10 cm RV S prime:     13.60 cm/s LEFT ATRIUM             Index       RIGHT ATRIUM          Index LA diam:        2.00 cm 1.27 cm/m  RA Area:     7.02 cm LA Vol (A2C):   11.7 ml 7.42 ml/m  RA Volume:   11.60 ml 7.36 ml/m LA Vol (A4C):    9.2 ml  5.83 ml/m LA Biplane Vol: 10.5 ml 6.66 ml/m  AORTIC VALVE AV Area (Vmax):    2.62 cm AV Area (Vmean):   2.64 cm AV Area (VTI):     2.39 cm AV Vmax:           93.30 cm/s AV Vmean:          66.300 cm/s AV VTI:            0.178 m AV Peak Grad:      3.5 mmHg AV Mean Grad:      2.0 mmHg LVOT Vmax:         86.20 cm/s LVOT Vmean:        61.800 cm/s LVOT VTI:          0.150 m LVOT/AV VTI ratio: 0.84  AORTA Ao Root diam: 2.80 cm MITRAL VALVE               TRICUSPID VALVE MV Area (PHT): 6.17 cm    TR Peak grad:   18.1 mmHg MV Decel Time: 123 msec  TR Vmax:        213.00 cm/s MV E velocity: 68.10 cm/s MV A velocity: 96.40 cm/s  SHUNTS MV E/A ratio:  0.71        Systemic VTI:  0.15 m                            Systemic Diam: 1.90 cm Kate Sable MD Electronically signed by Kate Sable MD Signature Date/Time: 06/01/2022/4:07:32 PM    Final    MR BRAIN WO CONTRAST  Result Date: 06/01/2022 CLINICAL DATA:  Provided history: Neuro deficit, acute, stroke suspected. EXAM: MRI HEAD WITHOUT CONTRAST MRI CERVICAL SPINE WITHOUT CONTRAST TECHNIQUE: Multiplanar, multiecho pulse sequences of the brain and surrounding structures, and cervical spine, to include the craniocervical junction and cervicothoracic junction, were obtained without intravenous contrast. COMPARISON:  Noncontrast head CT performed earlier today 06/01/2022. FINDINGS: MRI HEAD FINDINGS Brain: Mild generalized cerebral atrophy. Multifocal T2 FLAIR hyperintense signal abnormality within the cerebral white matter, nonspecific but compatible with mild chronic small vessel ischemic disease. There is no acute infarct. No evidence of an intracranial mass. No chronic intracranial blood products. No extra-axial fluid collection. No midline shift. Vascular: Maintained flow voids within the proximal large arterial vessels. Skull and upper cervical spine: No focal suspicious marrow lesion. Sinuses/Orbits: No mass or acute finding within the imaged  orbits. Mild mucosal thickening within the left maxillary sinus. Other: Bilateral mastoid effusions (larger on the left). MRI CERVICAL SPINE FINDINGS Intermittently motion degraded exam. Most notably, the sagittal STIR sequence and axial T2 GRE sequences are moderate-to-severely motion degraded. Alignment: Straightening of the expected cervical lordosis. No significant spondylolisthesis. Vertebrae: Vertebral body height is maintained. No significant marrow edema or focal suspicious osseous lesion is identified. Small multilevel vertebral body hemangiomas. Cord: Within the limitations of motion degradation, no signal abnormality is identified within the cervical spinal cord. Posterior Fossa, vertebral arteries, paraspinal tissues: Posterior fossa assessed on same-day brain MRI. Flow voids preserved within the imaged cervical vertebral arteries. No paraspinal mass or collection. Disc levels: Moderate disc degeneration at C4-C5 and C5-C6. No more than mild disc degeneration at the remaining levels. C2-C3: Small central disc protrusion. No significant spinal canal or foraminal stenosis. C3-C4: Small central disc protrusion. Uncovertebral hypertrophy on the right. Mild effacement of the ventral thecal sac (without spinal cord mass effect). Mild right neural foraminal narrowing. C4-C5: Disc bulge with bilateral uncovertebral hypertrophy. Mild effacement of the ventral thecal sac (without spinal cord mass effect). Bilateral neural foraminal narrowing (moderate right, mild left). C5-C6: Disc bulge with bilateral uncovertebral hypertrophy. Mild effacement of the ventral thecal sac (without spinal cord mass effect). Bilateral neural foraminal narrowing (severe right, moderate/severe left). C6-C7: Slight disc bulge. No significant spinal canal or foraminal stenosis. C7-T1: Slight disc bulge. No significant spinal canal or foraminal stenosis. IMPRESSION: MRI brain: 1.  No evidence of an acute intracranial abnormality. 2. Mild  chronic small vessel ischemic changes within the cerebral white matter. 3. Mild generalized cerebral atrophy. 4. Mild mucosal thickening within the left maxillary sinus. 5. Bilateral mastoid effusions. MRI cervical spine: 1. Motion degraded examination, limiting evaluation. 2. Cervical spondylosis, as outlined. 3. No more than mild spinal canal stenosis. 4. Multilevel foraminal stenosis, as detailed and greatest on the right at C4-C5 (moderate) and bilaterally at C5-C6 (severe right, moderate/severe left). 5. Disc degeneration is greatest at C4-C5 and C5-C6 (moderate at these levels). Electronically Signed   By: Kellie Simmering D.O.   On:  06/01/2022 12:00   MR CERVICAL SPINE WO CONTRAST  Result Date: 06/01/2022 CLINICAL DATA:  Provided history: Neuro deficit, acute, stroke suspected. EXAM: MRI HEAD WITHOUT CONTRAST MRI CERVICAL SPINE WITHOUT CONTRAST TECHNIQUE: Multiplanar, multiecho pulse sequences of the brain and surrounding structures, and cervical spine, to include the craniocervical junction and cervicothoracic junction, were obtained without intravenous contrast. COMPARISON:  Noncontrast head CT performed earlier today 06/01/2022. FINDINGS: MRI HEAD FINDINGS Brain: Mild generalized cerebral atrophy. Multifocal T2 FLAIR hyperintense signal abnormality within the cerebral white matter, nonspecific but compatible with mild chronic small vessel ischemic disease. There is no acute infarct. No evidence of an intracranial mass. No chronic intracranial blood products. No extra-axial fluid collection. No midline shift. Vascular: Maintained flow voids within the proximal large arterial vessels. Skull and upper cervical spine: No focal suspicious marrow lesion. Sinuses/Orbits: No mass or acute finding within the imaged orbits. Mild mucosal thickening within the left maxillary sinus. Other: Bilateral mastoid effusions (larger on the left). MRI CERVICAL SPINE FINDINGS Intermittently motion degraded exam. Most notably,  the sagittal STIR sequence and axial T2 GRE sequences are moderate-to-severely motion degraded. Alignment: Straightening of the expected cervical lordosis. No significant spondylolisthesis. Vertebrae: Vertebral body height is maintained. No significant marrow edema or focal suspicious osseous lesion is identified. Small multilevel vertebral body hemangiomas. Cord: Within the limitations of motion degradation, no signal abnormality is identified within the cervical spinal cord. Posterior Fossa, vertebral arteries, paraspinal tissues: Posterior fossa assessed on same-day brain MRI. Flow voids preserved within the imaged cervical vertebral arteries. No paraspinal mass or collection. Disc levels: Moderate disc degeneration at C4-C5 and C5-C6. No more than mild disc degeneration at the remaining levels. C2-C3: Small central disc protrusion. No significant spinal canal or foraminal stenosis. C3-C4: Small central disc protrusion. Uncovertebral hypertrophy on the right. Mild effacement of the ventral thecal sac (without spinal cord mass effect). Mild right neural foraminal narrowing. C4-C5: Disc bulge with bilateral uncovertebral hypertrophy. Mild effacement of the ventral thecal sac (without spinal cord mass effect). Bilateral neural foraminal narrowing (moderate right, mild left). C5-C6: Disc bulge with bilateral uncovertebral hypertrophy. Mild effacement of the ventral thecal sac (without spinal cord mass effect). Bilateral neural foraminal narrowing (severe right, moderate/severe left). C6-C7: Slight disc bulge. No significant spinal canal or foraminal stenosis. C7-T1: Slight disc bulge. No significant spinal canal or foraminal stenosis. IMPRESSION: MRI brain: 1.  No evidence of an acute intracranial abnormality. 2. Mild chronic small vessel ischemic changes within the cerebral white matter. 3. Mild generalized cerebral atrophy. 4. Mild mucosal thickening within the left maxillary sinus. 5. Bilateral mastoid effusions.  MRI cervical spine: 1. Motion degraded examination, limiting evaluation. 2. Cervical spondylosis, as outlined. 3. No more than mild spinal canal stenosis. 4. Multilevel foraminal stenosis, as detailed and greatest on the right at C4-C5 (moderate) and bilaterally at C5-C6 (severe right, moderate/severe left). 5. Disc degeneration is greatest at C4-C5 and C5-C6 (moderate at these levels). Electronically Signed   By: Kellie Simmering D.O.   On: 06/01/2022 12:00   CT HEAD CODE STROKE WO CONTRAST`  Addendum Date: 06/01/2022   ADDENDUM REPORT: 06/01/2022 08:16 ADDENDUM: Study discussed by telephone with NP Donell Beers on 06/01/2022 at 0757 hours. Electronically Signed   By: Genevie Ann M.D.   On: 06/01/2022 08:16   Result Date: 06/01/2022 CLINICAL DATA:  Code stroke. 62 year old female new left upper extremity weakness. EXAM: CT HEAD WITHOUT CONTRAST TECHNIQUE: Contiguous axial images were obtained from the base of the skull through the vertex without  intravenous contrast. RADIATION DOSE REDUCTION: This exam was performed according to the departmental dose-optimization program which includes automated exposure control, adjustment of the mA and/or kV according to patient size and/or use of iterative reconstruction technique. COMPARISON:  None Available. FINDINGS: Brain: Cerebral volume is within normal limits for age. No midline shift, ventriculomegaly, mass effect, evidence of mass lesion, intracranial hemorrhage or evidence of cortically based acute infarction. Gray-white matter differentiation within normal limits the for age throughout the brain. Vascular: Calcified atherosclerosis at the skull base. No suspicious intracranial vascular hyperdensity. Skull: No acute osseous abnormality identified. Sinuses/Orbits: Mild to moderate left mastoid effusion. Left tympanic cavity appears to remain clear. Grossly negative visible nasopharynx. Other Visualized paranasal sinuses and mastoids are clear. Other: Visualized orbits and  scalp soft tissues are within normal limits. ASPECTS [facility] Stroke Program Early CT Score) Total score (0-10 with 10 being normal): 10 IMPRESSION: 1. Normal for age non contrast CT appearance of the brain. ASPECTS 10. 2. Left mastoid effusion, likely postinflammatory and significance doubtful. Electronically Signed: By: Genevie Ann M.D. On: 06/01/2022 07:53   CT Angio Chest PE W/Cm &/Or Wo Cm  Result Date: 05/31/2022 CLINICAL DATA:  Pulmonary embolism suspected. High probability. Near syncopal episode. Difficulty breathing. EXAM: CT ANGIOGRAPHY CHEST WITH CONTRAST TECHNIQUE: Multidetector CT imaging of the chest was performed using the standard protocol during bolus administration of intravenous contrast. Multiplanar CT image reconstructions and MIPs were obtained to evaluate the vascular anatomy. RADIATION DOSE REDUCTION: This exam was performed according to the departmental dose-optimization program which includes automated exposure control, adjustment of the mA and/or kV according to patient size and/or use of iterative reconstruction technique. CONTRAST:  72m OMNIPAQUE IOHEXOL 350 MG/ML SOLN COMPARISON:  AP chest 05/31/2022, chest two views 08/23/2021 FINDINGS: Cardiovascular: The main pulmonary artery is opacified up to 905 Hounsfield units. No filling defect is seen to indicate a pulmonary embolism. Heart size is normal. No pericardial effusion. No thoracic aortic aneurysm. Moderate atherosclerotic calcifications within the thoracic aorta. Mediastinum/Nodes: No axillary, mediastinal, or hilar pathologically enlarged lymph nodes I CT criteria. The visualized thyroid is unremarkable. Probable tiny sliding hiatal hernia. Lungs/Pleura: The central airways are patent. There is opacification of the bilateral lower lobe posterior and lateral segmental airways fairly symmetrically, possibly from mucous plugging (axial series 9 images 99 through 137). There is associated bilateral lower lobe posterior and lateral  segmental mild bronchial wall thickening and mild bronchiectasis. There are scattered centrilobular and tree-in-bud airspace opacities within the bilateral lower lobes, greatest at the far inferior left-greater-than-right aspects. This suggests airways disseminated infection, possibly chronic, and possibly related to aspiration. There is also similar opacification of the medial segment of the right middle lobe airway with distal curvilinear and centrilobular/tree-in-bud opacities (axial series 9, images 109 through 129). Mild similar curvilinear and centrilobular nodular densities within the anteromedial left upper lobe (axial series 9, image 76). No pleural effusion or pneumothorax. Upper Abdomen: There is reflux of contrast into the inferior vena cava and hepatic veins, possibly from right heart insufficiency. No acute abnormality is seen within the upper abdomen. Musculoskeletal: Moderate multilevel degenerative disc changes of the midthoracic spine. Review of the MIP images confirms the above findings. IMPRESSION: 1. No pulmonary embolism is seen. 2. There is opacification of the bilateral lower lobe posterior and lateral segmental airways, possibly from mucous plugging. There is associated bilateral lower lobe posterior and lateral segmental mild bronchial wall thickening and mild bronchiectasis. 3. There are scattered centrilobular and tree-in-bud airspace opacities within the bilateral  lower lobes, medial segment of the right middle lobe, and anteromedial left upper lobe. This suggests mild multifocal airways disseminated infection, possibly chronic, and possibly related to aspiration. 4. There is reflux of contrast into the inferior vena cava and hepatic veins, possibly from right heart insufficiency. Electronically Signed   By: Yvonne Kendall M.D.   On: 05/31/2022 18:47   DG Chest Port 1 View  Result Date: 05/31/2022 CLINICAL DATA:  Sepsis EXAM: PORTABLE CHEST 1 VIEW COMPARISON:  08/23/21 CXR FINDINGS: No  pleural effusion. No pneumothorax. No focal airspace opacity. Normal cardiac and mediastinal contours. No radiographically apparent displaced fracture. Visualized upper abdomen is notable for colonic gaseous distention IMPRESSION: 1. No focal airspace opacity. 2. Visualized upper abdomen is notable for colonic gaseous distention. If there is clinical concern for abdominal pathology, further evaluation with a dedicated abdominal radiograph is recommended. Electronically Signed   By: Marin Roberts M.D.   On: 05/31/2022 14:23     Medications:    sodium chloride Stopped (06/01/22 1747)   azithromycin Stopped (06/01/22 1747)   cefTRIAXone (ROCEPHIN)  IV Stopped (06/01/22 1714)   heparin 900 Units/hr (06/01/22 2134)   potassium PHOSPHATE IVPB (in mmol) 30 mmol (06/02/22 0841)    aspirin EC  81 mg Oral Daily   atorvastatin  80 mg Oral Daily   guaiFENesin  600 mg Oral BID   methylPREDNISolone (SOLU-MEDROL) injection  40 mg Intravenous Daily   sodium chloride, acetaminophen, docusate sodium, guaiFENesin-dextromethorphan, ipratropium-albuterol, ondansetron (ZOFRAN) IV, mouth rinse, polyethylene glycol  Assessment/ Plan:  Katie Moody is a 62 y.o.  female with past medical conditions including hypertension and tobacco use, who was admitted to [facility] on 05/31/2022 for Lactic acidosis [E87.20] Hyponatremia [E87.1] Weakness [R53.1] COPD exacerbation (HCC) [J44.1] NSTEMI (non-ST elevated myocardial infarction) ([place]) [I21.4] Chronic obstructive pulmonary disease, unspecified COPD type ([place]) [J44.9]   Hyponatremia, severe. Symptomatic. Sodium 110 on ED arrival. Likely due to poor oral intake and HCTZ. Urine osm 295. IVF stopped, due to rapid correction. Sodium 124 today. Patient encouraged to increase oral intake.     LOS: 2 [place] 2/27/202410:17 AM

## 2022-06-02 NOTE — Progress Notes (Signed)
Initial Nutrition Assessment  DOCUMENTATION CODES:   Non-severe (moderate) malnutrition in context of social or environmental circumstances  INTERVENTION:   Ensure Enlive po TID, each supplement provides 350 kcal and 20 grams of protein.  Magic cup TID with meals, each supplement provides 290 kcal and 9 grams of protein  MVI po daily   Pt at high refeed risk; recommend monitor potassium, magnesium and phosphorus labs daily until stable  NUTRITION DIAGNOSIS:   Moderate Malnutrition related to social / environmental circumstances as evidenced by moderate fat depletion, moderate muscle depletion, severe muscle depletion.  GOAL:   Patient will meet greater than or equal to 90% of their needs  MONITOR:   PO intake, Supplement acceptance, Labs, Weight trends, I & O's, Skin  REASON FOR ASSESSMENT:   Consult Assessment of nutrition requirement/status  ASSESSMENT:   62 y.o female with significant PMH of HTN and tobacco use who is admitted with CAP and sepsis.  Met with pt in room today. Pt reports that she is not feeling well today. Pt reports poor appetite and oral intake at baseline and in hospital. Pt reports that she has never been a big eater. Pt ate a few bites of a [place] sandwich for lunch today. Pt reports that she does not drink supplements at home as she does not like milk. RD discussed with pt the importance of adequate nutrition needed to preserve lean muscle. Pt reluctantly agrees to try vanilla Ensure and Magic Cups. Pt offered snacks but declined; pt reports that she does not like yogurt, peanut butter or cottage cheese. Pt does eat ice cream during warm months. RD will try supplements and MVI to help pt meet her estimated needs. Pt is at high refeed risk; electrolytes are being monitored and supplemented. Per chart, pt appears weight stable pta.   Medications reviewed and include: aspirin, solu-medrol, azithromycin, ceftriaxone, heparin   Labs reviewed: Na 124(L), K  4.9 wnl, P 1.7(L), Mg 2.2 wnl Wbc- 22.2(H), Hgb 10.9(L), Hct 30.6(L)  NUTRITION - FOCUSED PHYSICAL EXAM:  Flowsheet Row Most Recent Value  Orbital Region Mild depletion  Upper Arm Region Severe depletion  Thoracic and Lumbar Region Moderate depletion  Buccal Region Mild depletion  Temple Region Moderate depletion  Clavicle Bone Region Severe depletion  Clavicle and Acromion Bone Region Severe depletion  Scapular Bone Region Moderate depletion  Dorsal Hand Severe depletion  Patellar Region Severe depletion  Anterior Thigh Region Severe depletion  Posterior Calf Region Severe depletion  Edema (RD Assessment) None  Hair Reviewed  Eyes Reviewed  Mouth Reviewed  Skin Reviewed  Nails Reviewed   Diet Order:   Diet Order             Diet regular Room service appropriate? Yes with Assist; Fluid consistency: Thin  Diet effective now                  EDUCATION NEEDS:   Education needs have been addressed  Skin:  Skin Assessment: Reviewed RN Assessment  Last BM:  2/25  Height:   Ht Readings from Last 1 Encounters:  05/31/22 5' 8"$  (1.727 m)    Weight:   Wt Readings from Last 1 Encounters:  06/02/22 52.1 kg    Ideal Body Weight:  63.6 kg  BMI:  Body mass index is 17.46 kg/m.  Estimated Nutritional Needs:   Kcal:  1500-1700kcal/day  Protein:  75-85g/day  Fluid:  1.5-1.7L/day  Koleen Distance MS, RD, LDN Please refer to [facility] for RD and/or RD on-call/weekend/after  hours pager

## 2022-06-02 NOTE — Progress Notes (Signed)
Subjective: No significant changes  Exam: Vitals:   06/02/22 0843 06/02/22 0912  BP:  110/60  Pulse:  62  Resp: 16 18  Temp:  97.8 F (36.6 C)  SpO2:  100%   Gen: In bed, NAD Resp: non-labored breathing, no acute distress Abd: soft, nt  Neuro: MS: Awake, alert, oriented and appropriate CN: EOMI, visual fields full, face symmetric Motor: She has weakness in a radial distribution throughout the left arm, she has bilaterally symmetric difficulty with hip flexion today, good strength distally, no signs of other mononeuropathies. Sensory: Decreased over the dorsum of the left hand  Pertinent Labs: Sodium is increased to 124  Impression: 62 year old female presenting with generalized weakness in the setting of severe hyponatremia.  My suspicion is that she likely had some pressure on her radial nerve for prolonged period of time, but the history is very unclear.  With an isolated radial nerve palsy, I would favor treating conservatively with physical therapy and Occupational Therapy.  An outpatient EMG could be considered.  No further acute neurodiagnostic testing is needed at this time.  Recommendations: 1) consider outpatient referral for EMG 2) PT/OT 3) neurology will be available as needed.  Roland Rack, MD [facility] [phone]  If 7pm- 7am, please page neurology on call as listed in [place].

## 2022-06-02 NOTE — Consult Note (Signed)
ANTICOAGULATION CONSULT NOTE  Pharmacy Consult for heparin Indication: ACS/STEMI  No Known Allergies  Patient Measurements: Height: '5\' 8"'$  (172.7 cm) Weight: 52.1 kg (114 lb 13.8 oz) IBW/kg (Calculated) : 63.9 Heparin Dosing Weight: 47.6 kg  Vital Signs: Temp: 97.8 F (36.6 C) (02/27 0912) Temp Source: Oral (02/27 0912) BP: 110/60 (02/27 0912) Pulse Rate: 62 (02/27 0912)  Labs: Recent Labs    05/31/22 1456 05/31/22 1458 05/31/22 1628 05/31/22 1628 05/31/22 1629 05/31/22 2004 06/01/22 0906 06/01/22 1751 06/01/22 2250 06/02/22 0148 06/02/22 0510 06/02/22 0834  HGB 14.0  --   --   --   --   --  11.5*  --   --   --  10.9*  --   HCT RESULTS UNAVAILABLE DUE TO INTERFERING SUBSTANCE  --   --   --   --   --  32.2*  --   --   --  30.6*  --   PLT 395  --   --   --   --   --  383  --   --   --  364  --   APTT  --  29  --   --   --   --   --   --   --   --   --   --   LABPROT  --  13.9  --   --   --   --   --   --   --   --   --   --   INR  --  1.1  --   --   --   --   --   --   --   --   --   --   HEPARINUNFRC  --   --   --   --   --    < > 0.10* <0.10*  --  0.35  --  0.33  CREATININE  --   --  0.68   < > 0.64   < > 0.59 0.55 0.45  --  0.39*  --   CKTOTAL  --   --   --   --   --   --   --   --   --   --  111  --   TROPONINIHS 935*  --  1,582*  --  1,011*  --   --   --   --   --   --   --    < > = values in this interval not displayed.     Estimated Creatinine Clearance: 59.2 mL/min (A) (by C-G formula based on SCr of 0.39 mg/dL (L)).   Medical History: Past Medical History:  Diagnosis Date   Hypertension     Medications:  No evidence of PTA anticoagulation  Assessment: Pharmacy consulted to dose heparin in this 62 yo F who presents to the ED with SOB/chest pain.  Troponin 935.  2/26 1750 HL < 0.10 Subtherapeutic  2/27 0148 HL = 0.35, therapeutic X 1  2/27 0834 HL = 0.33   Goal of Therapy:  Heparin level 0.3-0.7 units/ml Monitor platelets by anticoagulation  protocol: Yes  Plan:  Heparin level is therapeutic. Will continue heparin infusion at 900 units/hr. Recheck heparin level and CBC with AM labs.   Oswald Hillock, PharmD, BCPS Clinical Pharmacist   06/02/2022 9:54 AM

## 2022-06-03 DIAGNOSIS — E440 Moderate protein-calorie malnutrition: Secondary | ICD-10-CM | POA: Insufficient documentation

## 2022-06-03 DIAGNOSIS — I259 Chronic ischemic heart disease, unspecified: Secondary | ICD-10-CM

## 2022-06-03 DIAGNOSIS — R29898 Other symptoms and signs involving the musculoskeletal system: Secondary | ICD-10-CM | POA: Insufficient documentation

## 2022-06-03 LAB — BASIC METABOLIC PANEL
Anion gap: 7 (ref 5–15)
Anion gap: 8 (ref 5–15)
BUN: 6 mg/dL — ABNORMAL LOW (ref 8–23)
BUN: 7 mg/dL — ABNORMAL LOW (ref 8–23)
CO2: 33 mmol/L — ABNORMAL HIGH (ref 22–32)
CO2: 33 mmol/L — ABNORMAL HIGH (ref 22–32)
Calcium: 8.3 mg/dL — ABNORMAL LOW (ref 8.9–10.3)
Calcium: 8.5 mg/dL — ABNORMAL LOW (ref 8.9–10.3)
Chloride: 86 mmol/L — ABNORMAL LOW (ref 98–111)
Chloride: 86 mmol/L — ABNORMAL LOW (ref 98–111)
Creatinine, Ser: 0.38 mg/dL — ABNORMAL LOW (ref 0.44–1.00)
Creatinine, Ser: 0.5 mg/dL (ref 0.44–1.00)
GFR, Estimated: >60 mL/min (ref >60)
GFR, Estimated: >60 mL/min (ref >60)
Glucose, Bld: 123 mg/dL — ABNORMAL HIGH (ref 70–99)
Glucose, Bld: 211 mg/dL — ABNORMAL HIGH (ref 70–99)
Potassium: 4.1 mmol/L (ref 3.5–5.1)
Potassium: 4.5 mmol/L (ref 3.5–5.1)
Sodium: 126 mmol/L — ABNORMAL LOW (ref 135–145)
Sodium: 127 mmol/L — ABNORMAL LOW (ref 135–145)

## 2022-06-03 LAB — CBC
HCT: 32 % — ABNORMAL LOW (ref 36.0–46.0)
Hemoglobin: 10.9 g/dL — ABNORMAL LOW (ref 12.0–15.0)
MCH: 30.2 pg (ref 26.0–34.0)
MCHC: 34.1 g/dL (ref 30.0–36.0)
MCV: 88.6 fL (ref 80.0–100.0)
Platelets: 353 10*3/uL (ref 150–400)
RBC: 3.61 MIL/uL — ABNORMAL LOW (ref 3.87–5.11)
RDW: 11.5 % (ref 11.5–15.5)
WBC: 14.7 10*3/uL — ABNORMAL HIGH (ref 4.0–10.5)
nRBC: 0 % (ref 0.0–0.2)

## 2022-06-03 LAB — PHOSPHORUS: Phosphorus: 2.2 mg/dL — ABNORMAL LOW (ref 2.5–4.6)

## 2022-06-03 LAB — MAGNESIUM: Magnesium: 2.1 mg/dL (ref 1.7–2.4)

## 2022-06-03 LAB — HEPARIN LEVEL (UNFRACTIONATED): Heparin Unfractionated: 0.43 IU/mL (ref 0.30–0.70)

## 2022-06-03 MED ORDER — SPIRONOLACTONE 12.5 MG HALF TABLET
12.5000 mg | ORAL_TABLET | Freq: Every day | ORAL | Status: DC
  Administered 2022-06-03 – 2022-06-04 (×2): 12.5 mg via ORAL
  Filled 2022-06-03 (×3): qty 1

## 2022-06-03 MED ORDER — POTASSIUM PHOSPHATES 15 MMOLE/5ML IV SOLN
30.0000 mmol | Freq: Once | INTRAVENOUS | Status: AC
  Administered 2022-06-03: 30 mmol via INTRAVENOUS
  Filled 2022-06-03: qty 10

## 2022-06-03 MED ORDER — HEPARIN SODIUM (PORCINE) 5000 UNIT/ML IJ SOLN
5000.0000 [IU] | Freq: Three times a day (TID) | INTRAMUSCULAR | Status: DC
  Administered 2022-06-03 – 2022-06-08 (×14): 5000 [IU] via SUBCUTANEOUS
  Filled 2022-06-03 (×14): qty 1

## 2022-06-03 MED ORDER — METOPROLOL SUCCINATE ER 25 MG PO TB24
25.0000 mg | ORAL_TABLET | Freq: Every day | ORAL | Status: DC
  Administered 2022-06-03 – 2022-06-07 (×5): 25 mg via ORAL
  Filled 2022-06-03 (×6): qty 1

## 2022-06-03 NOTE — Assessment & Plan Note (Addendum)
Present on admission.  Completed Rocephin, Zithromax and Solu-Medrol.

## 2022-06-03 NOTE — Assessment & Plan Note (Signed)
Multifocal pneumonia seen on CT scan.

## 2022-06-03 NOTE — Plan of Care (Signed)
Patient AOX4, fatigue and forgetful at times, VSS throughout shift.  All meds given on time as ordered.  Pt denied pain and SOB.  Diminished lungs, IS encouraged.  Purewick in place.  POC maintained, will continue to monitor.  Problem: Education: Goal: Knowledge of General Education information will improve Description: Including pain rating scale, medication(s)/side effects and non-pharmacologic comfort measures Outcome: Progressing   Problem: Health Behavior/Discharge Planning: Goal: Ability to manage health-related needs will improve Outcome: Progressing   Problem: Clinical Measurements: Goal: Ability to maintain clinical measurements within normal limits will improve Outcome: Progressing Goal: Will remain free from infection Outcome: Progressing Goal: Diagnostic test results will improve Outcome: Progressing Goal: Respiratory complications will improve Outcome: Progressing Goal: Cardiovascular complication will be avoided Outcome: Progressing   Problem: Activity: Goal: Risk for activity intolerance will decrease Outcome: Progressing   Problem: Nutrition: Goal: Adequate nutrition will be maintained Outcome: Progressing   Problem: Coping: Goal: Level of anxiety will decrease Outcome: Progressing   Problem: Elimination: Goal: Will not experience complications related to bowel motility Outcome: Progressing Goal: Will not experience complications related to urinary retention Outcome: Progressing   Problem: Pain Managment: Goal: General experience of comfort will improve Outcome: Progressing   Problem: Safety: Goal: Ability to remain free from injury will improve Outcome: Progressing   Problem: Skin Integrity: Goal: Risk for impaired skin integrity will decrease Outcome: Progressing

## 2022-06-03 NOTE — Progress Notes (Signed)
[facility]  ROUNDING NOTE   Subjective:   Patient resting in bed, eyes closed Breakfast tray untouched at bedside Alert and oriented, stronger voice States she feels well today Remains on [place], 2L Reports appetite slowly improving  Sodium 126  Objective:  Vital signs in last 24 hours:  Temp:  [97.8 F (36.6 C)-98.5 F (36.9 C)] 97.8 F (36.6 C) (02/28 0933) Pulse Rate:  [73-104] 80 (02/28 0933) Resp:  [16-18] 18 (02/28 0933) BP: (92-112)/(66-92) 101/66 (02/28 0933) SpO2:  [95 %-100 %] 98 % (02/28 0933) Weight:  [49.2 kg] 49.2 kg (02/28 0300)  Weight change: -2.9 kg Filed Weights   06/01/22 0500 06/02/22 0500 06/03/22 0300  Weight: 49.3 kg 52.1 kg 49.2 kg    Intake/Output: I/O last 3 completed shifts: In: 1020.4 [P.O.:238; I.V.:241.8; IV Piggyback:540.6] Out: 600 [Urine:600]   Intake/Output this shift:  No intake/output data recorded.  Physical Exam: General: NAD  Head: Normocephalic, atraumatic. Moist oral mucosal membranes  Eyes: Anicteric  Lungs:  Clear to auscultation, normal effort  Heart: Regular rate and rhythm  Abdomen:  Soft, nontender  Extremities:  No peripheral edema.  Neurologic: Alert and oriented, moving all four extremities  Skin: No lesions  Access: None    Basic Metabolic Panel: Recent Labs  Lab 06/01/22 0458 06/01/22 0906 06/02/22 0510 06/02/22 1052 06/02/22 1627 06/02/22 2258 06/03/22 0434  NA 117*   < > 124* 124* 124* 125* 126*  K 3.4*   < > 3.5 4.9 4.1 4.2 4.1  CL 80*   < > 85* 83* 85* 84* 86*  CO2 26   < > '29 31 31 '$ 35* 33*  GLUCOSE 85   < > 128* 202* 166* 159* 123*  BUN 9   < > 7* 6* 7* 7* 6*  CREATININE 0.60   < > 0.39* 0.49 0.45 0.42* 0.38*  CALCIUM 7.9*   < > 8.0* 8.4* 8.1* 8.1* 8.3*  MG 1.8  --  2.2  --   --   --  2.1  PHOS 3.2  --  1.7*  --   --   --  2.2*   < > = values in this interval not displayed.     Liver Function Tests: Recent Labs  Lab 05/31/22 1628 06/01/22 0458 06/02/22 0510  AST '29  25 21  '$ ALT '14 14 14  '$ ALKPHOS 67 60 62  BILITOT 1.3* 1.0 0.8  PROT 6.4* 5.6* 5.6*  ALBUMIN 3.2* 2.7* 2.7*    Recent Labs  Lab 05/31/22 2109  LIPASE 59*    No results for input(s): "AMMONIA" in the last 168 hours.  CBC: Recent Labs  Lab 05/31/22 1456 06/01/22 0906 06/02/22 0510 06/03/22 0434  WBC 27.7* 19.7* 22.2* 14.7*  NEUTROABS  --  17.9* 19.4*  --   HGB 14.0 11.5* 10.9* 10.9*  HCT RESULTS UNAVAILABLE DUE TO INTERFERING SUBSTANCE 32.2* 30.6* 32.0*  MCV RESULTS UNAVAILABLE DUE TO INTERFERING SUBSTANCE 84.3 86.0 88.6  PLT 395 383 364 353     Cardiac Enzymes: Recent Labs  Lab 06/02/22 0510  CKTOTAL 111     BNP: Invalid input(s): "POCBNP"  CBG: Recent Labs  Lab 05/31/22 1927  GLUCAP 104     Microbiology: Results for orders placed or performed during the hospital encounter of 05/31/22  Blood Culture (routine x 2)     Status: None (Preliminary result)   Collection Time: 05/31/22  2:58 PM   Specimen: BLOOD  Result Value Ref Range Status   Specimen Description  BLOOD RAC  Final   Special Requests   Final    BOTTLES DRAWN AEROBIC AND ANAEROBIC Blood Culture results may not be optimal due to an inadequate volume of blood received in culture bottles   Culture   Final    NO GROWTH 3 DAYS Performed at [facility], [address] [place], [place] [address]    Report Status PENDING  Incomplete  Blood Culture (routine x 2)     Status: None (Preliminary result)   Collection Time: 05/31/22  3:03 PM   Specimen: BLOOD  Result Value Ref Range Status   Specimen Description BLOOD LAC  Final   Special Requests   Final    BOTTLES DRAWN AEROBIC AND ANAEROBIC Blood Culture adequate volume   Culture   Final    NO GROWTH 3 DAYS Performed at [facility], [address], [place],  [address]    Report Status PENDING  Incomplete  Resp panel by RT-PCR (RSV, Flu A&B, Covid) Anterior Nasal Swab     Status: None   Collection Time: 05/31/22  4:28  PM   Specimen: Anterior Nasal Swab  Result Value Ref Range Status   SARS Coronavirus 2 by RT PCR NEGATIVE NEGATIVE Final    Comment: (NOTE) SARS-CoV-2 target nucleic acids are NOT DETECTED.  The SARS-CoV-2 RNA is generally detectable in upper respiratory specimens during the acute phase of infection. The lowest concentration of SARS-CoV-2 viral copies this assay can detect is 138 copies/mL. A negative result does not preclude SARS-Cov-2 infection and should not be used as the sole basis for treatment or other patient management decisions. A negative result may occur with  improper specimen collection/handling, submission of specimen other than nasopharyngeal swab, presence of viral mutation(s) within the areas targeted by this assay, and inadequate number of viral copies(<138 copies/mL). A negative result must be combined with clinical observations, patient history, and epidemiological information. The expected result is Negative.  Fact Sheet for Patients:  EntrepreneurPulse.com.au  Fact Sheet for Healthcare Providers:  IncredibleEmployment.be  This test is no t yet approved or cleared by the [place] FDA and  has been authorized for detection and/or diagnosis of SARS-CoV-2 by FDA under an Emergency Use Authorization (EUA). This EUA will remain  in effect (meaning this test can be used) for the duration of the COVID-19 declaration under Section 564(b)(1) of the Act, 21 U.S.C.section 360bbb-3(b)(1), unless the authorization is terminated  or revoked sooner.       Influenza A by PCR NEGATIVE NEGATIVE Final   Influenza B by PCR NEGATIVE NEGATIVE Final    Comment: (NOTE) The Xpert Xpress SARS-CoV-2/FLU/RSV plus assay is intended as an aid in the diagnosis of influenza from Nasopharyngeal swab specimens and should not be used as a sole basis for treatment. Nasal washings and aspirates are unacceptable for Xpert Xpress  SARS-CoV-2/FLU/RSV testing.  Fact Sheet for Patients: EntrepreneurPulse.com.au  Fact Sheet for Healthcare Providers: IncredibleEmployment.be  This test is not yet approved or cleared by the [place] FDA and has been authorized for detection and/or diagnosis of SARS-CoV-2 by FDA under an Emergency Use Authorization (EUA). This EUA will remain in effect (meaning this test can be used) for the duration of the COVID-19 declaration under Section 564(b)(1) of the Act, 21 U.S.C. section 360bbb-3(b)(1), unless the authorization is terminated or revoked.     Resp Syncytial Virus by PCR NEGATIVE NEGATIVE Final    Comment: (NOTE) Fact Sheet for Patients: EntrepreneurPulse.com.au  Fact Sheet for Healthcare Providers: IncredibleEmployment.be  This test is not yet approved or cleared by the [place] and has been authorized for detection and/or diagnosis of SARS-CoV-2 by FDA under an Emergency Use Authorization (EUA). This EUA will remain in effect (meaning this test can be used) for the duration of the COVID-19 declaration under Section 564(b)(1) of the Act, 21 U.S.C. section 360bbb-3(b)(1), unless the authorization is terminated or revoked.  Performed at [facility], [address] [place], [place] [address]   MRSA Next Gen by PCR, Nasal     Status: None   Collection Time: 05/31/22  9:11 PM   Specimen: Nasal Mucosa; Nasal Swab  Result Value Ref Range Status   MRSA by PCR Next Gen NOT DETECTED NOT DETECTED Final    Comment: (NOTE) The GeneXpert MRSA Assay (FDA approved for NASAL specimens only), is one component of a comprehensive MRSA colonization surveillance program. It is not intended to diagnose MRSA infection nor to guide or monitor treatment for MRSA infections. Test performance is not FDA approved in patients less than 35 years old. Performed at [facility], [address] [place],  [address]     Coagulation Studies: Recent Labs    05/31/22 1458  LABPROT 13.9  INR 1.1     Urinalysis: Recent Labs    05/31/22 2115  COLORURINE YELLOW*  LABSPEC 1.043*  PHURINE 7.0  GLUCOSEU NEGATIVE  HGBUR NEGATIVE  BILIRUBINUR NEGATIVE  KETONESUR 20*  PROTEINUR NEGATIVE  NITRITE NEGATIVE  LEUKOCYTESUR NEGATIVE       Imaging: ECHOCARDIOGRAM COMPLETE  Result Date: 06/01/2022    ECHOCARDIOGRAM REPORT   Patient Name:   [facility] Date of Exam: 06/01/2022 Medical Rec #:  [id number]       Height:       68.0 in Accession #:    [id number]      Weight:       108.7 lb Date of Birth:  Aug 21, 1960       BSA:          1.577 m Patient Age:    61 years        BP:           103/63 mmHg Patient Gender: F               HR:           87 bpm. Exam Location:  [facility] Procedure: 2D Echo, Cardiac Doppler and Color Doppler Indications:     Non- STEMI  History:         Patient has no prior history of Echocardiogram examinations.                  Risk Factors:Hypertension.  Sonographer:     Sherrie Sport Referring Phys:  [id number] Charlesetta Ivory GONFA Diagnosing Phys: Kate Sable MD  Sonographer Comments: Suboptimal parasternal window. IMPRESSIONS  1. Left ventricular ejection fraction, by estimation, is 55%. The left ventricle has normal function. The left ventricle demonstrates regional wall motion abnormalities (see scoring diagram/findings for description). Left ventricular diastolic parameters are consistent with Grade I diastolic dysfunction (impaired relaxation). There is moderate hypokinesis of the left ventricular, mid-apical anteroseptal wall.  2. Right ventricular systolic function is normal. The right ventricular size is normal.  3. The mitral valve is normal in structure. No evidence of mitral valve regurgitation.  4. The aortic valve is tricuspid. Aortic valve regurgitation is not visualized. FINDINGS  Left Ventricle: Left ventricular ejection fraction, by estimation, is 55%.  The left ventricle  has normal function. The left ventricle demonstrates regional wall motion abnormalities. Moderate hypokinesis of the left ventricular, mid-apical anteroseptal wall. The left ventricular internal cavity size was normal in size. There is no left ventricular hypertrophy. Left ventricular diastolic parameters are consistent with Grade I diastolic dysfunction (impaired relaxation). Right Ventricle: The right ventricular size is normal. No increase in right ventricular wall thickness. Right ventricular systolic function is normal. Left Atrium: Left atrial size was normal in size. Right Atrium: Right atrial size was normal in size. Pericardium: There is no evidence of pericardial effusion. Mitral Valve: The mitral valve is normal in structure. No evidence of mitral valve regurgitation. Tricuspid Valve: The tricuspid valve is normal in structure. Tricuspid valve regurgitation is not demonstrated. Aortic Valve: The aortic valve is tricuspid. Aortic valve regurgitation is not visualized. Aortic valve mean gradient measures 2.0 mmHg. Aortic valve peak gradient measures 3.5 mmHg. Aortic valve area, by VTI measures 2.39 cm. Pulmonic Valve: The pulmonic valve was not well visualized. Pulmonic valve regurgitation is not visualized. Aorta: The aortic root is normal in size and structure. Venous: The inferior vena cava was not well visualized. IAS/Shunts: No atrial level shunt detected by color flow Doppler.  LEFT VENTRICLE PLAX 2D LVIDd:         3.80 cm     Diastology LVIDs:         2.60 cm     LV e' medial:    6.96 cm/s LV PW:         1.00 cm     LV E/e' medial:  9.8 LV IVS:        1.10 cm     LV e' lateral:   10.30 cm/s LVOT diam:     1.90 cm     LV E/e' lateral: 6.6 LV SV:         43 LV SV Index:   27 LVOT Area:     2.84 cm  LV Volumes (MOD) LV vol d, MOD A2C: 51.3 ml LV vol d, MOD A4C: 72.3 ml LV vol s, MOD A2C: 30.1 ml LV vol s, MOD A4C: 39.6 ml LV SV MOD A2C:     21.2 ml LV SV MOD A4C:     72.3 ml LV SV  MOD BP:      27.8 ml RIGHT VENTRICLE RV Basal diam:  3.30 cm RV Mid diam:    2.10 cm RV S prime:     13.60 cm/s LEFT ATRIUM             Index       RIGHT ATRIUM          Index LA diam:        2.00 cm 1.27 cm/m  RA Area:     7.02 cm LA Vol (A2C):   11.7 ml 7.42 ml/m  RA Volume:   11.60 ml 7.36 ml/m LA Vol (A4C):   9.2 ml  5.83 ml/m LA Biplane Vol: 10.5 ml 6.66 ml/m  AORTIC VALVE AV Area (Vmax):    2.62 cm AV Area (Vmean):   2.64 cm AV Area (VTI):     2.39 cm AV Vmax:           93.30 cm/s AV Vmean:          66.300 cm/s AV VTI:            0.178 m AV Peak Grad:      3.5 mmHg AV Mean Grad:      2.0  mmHg LVOT Vmax:         86.20 cm/s LVOT Vmean:        61.800 cm/s LVOT VTI:          0.150 m LVOT/AV VTI ratio: 0.84  AORTA Ao Root diam: 2.80 cm MITRAL VALVE               TRICUSPID VALVE MV Area (PHT): 6.17 cm    TR Peak grad:   18.1 mmHg MV Decel Time: 123 msec    TR Vmax:        213.00 cm/s MV E velocity: 68.10 cm/s MV A velocity: 96.40 cm/s  SHUNTS MV E/A ratio:  0.71        Systemic VTI:  0.15 m                            Systemic Diam: 1.90 cm Kate Sable MD Electronically signed by Kate Sable MD Signature Date/Time: 06/01/2022/4:07:32 PM    Final    MR BRAIN WO CONTRAST  Result Date: 06/01/2022 CLINICAL DATA:  Provided history: Neuro deficit, acute, stroke suspected. EXAM: MRI HEAD WITHOUT CONTRAST MRI CERVICAL SPINE WITHOUT CONTRAST TECHNIQUE: Multiplanar, multiecho pulse sequences of the brain and surrounding structures, and cervical spine, to include the craniocervical junction and cervicothoracic junction, were obtained without intravenous contrast. COMPARISON:  Noncontrast head CT performed earlier today 06/01/2022. FINDINGS: MRI HEAD FINDINGS Brain: Mild generalized cerebral atrophy. Multifocal T2 FLAIR hyperintense signal abnormality within the cerebral white matter, nonspecific but compatible with mild chronic small vessel ischemic disease. There is no acute infarct. No evidence of an  intracranial mass. No chronic intracranial blood products. No extra-axial fluid collection. No midline shift. Vascular: Maintained flow voids within the proximal large arterial vessels. Skull and upper cervical spine: No focal suspicious marrow lesion. Sinuses/Orbits: No mass or acute finding within the imaged orbits. Mild mucosal thickening within the left maxillary sinus. Other: Bilateral mastoid effusions (larger on the left). MRI CERVICAL SPINE FINDINGS Intermittently motion degraded exam. Most notably, the sagittal STIR sequence and axial T2 GRE sequences are moderate-to-severely motion degraded. Alignment: Straightening of the expected cervical lordosis. No significant spondylolisthesis. Vertebrae: Vertebral body height is maintained. No significant marrow edema or focal suspicious osseous lesion is identified. Small multilevel vertebral body hemangiomas. Cord: Within the limitations of motion degradation, no signal abnormality is identified within the cervical spinal cord. Posterior Fossa, vertebral arteries, paraspinal tissues: Posterior fossa assessed on same-day brain MRI. Flow voids preserved within the imaged cervical vertebral arteries. No paraspinal mass or collection. Disc levels: Moderate disc degeneration at C4-C5 and C5-C6. No more than mild disc degeneration at the remaining levels. C2-C3: Small central disc protrusion. No significant spinal canal or foraminal stenosis. C3-C4: Small central disc protrusion. Uncovertebral hypertrophy on the right. Mild effacement of the ventral thecal sac (without spinal cord mass effect). Mild right neural foraminal narrowing. C4-C5: Disc bulge with bilateral uncovertebral hypertrophy. Mild effacement of the ventral thecal sac (without spinal cord mass effect). Bilateral neural foraminal narrowing (moderate right, mild left). C5-C6: Disc bulge with bilateral uncovertebral hypertrophy. Mild effacement of the ventral thecal sac (without spinal cord mass effect).  Bilateral neural foraminal narrowing (severe right, moderate/severe left). C6-C7: Slight disc bulge. No significant spinal canal or foraminal stenosis. C7-T1: Slight disc bulge. No significant spinal canal or foraminal stenosis. IMPRESSION: MRI brain: 1.  No evidence of an acute intracranial abnormality. 2. Mild chronic small vessel ischemic changes within the cerebral  white matter. 3. Mild generalized cerebral atrophy. 4. Mild mucosal thickening within the left maxillary sinus. 5. Bilateral mastoid effusions. MRI cervical spine: 1. Motion degraded examination, limiting evaluation. 2. Cervical spondylosis, as outlined. 3. No more than mild spinal canal stenosis. 4. Multilevel foraminal stenosis, as detailed and greatest on the right at C4-C5 (moderate) and bilaterally at C5-C6 (severe right, moderate/severe left). 5. Disc degeneration is greatest at C4-C5 and C5-C6 (moderate at these levels). Electronically Signed   By: Kellie Simmering D.O.   On: 06/01/2022 12:00   MR CERVICAL SPINE WO CONTRAST  Result Date: 06/01/2022 CLINICAL DATA:  Provided history: Neuro deficit, acute, stroke suspected. EXAM: MRI HEAD WITHOUT CONTRAST MRI CERVICAL SPINE WITHOUT CONTRAST TECHNIQUE: Multiplanar, multiecho pulse sequences of the brain and surrounding structures, and cervical spine, to include the craniocervical junction and cervicothoracic junction, were obtained without intravenous contrast. COMPARISON:  Noncontrast head CT performed earlier today 06/01/2022. FINDINGS: MRI HEAD FINDINGS Brain: Mild generalized cerebral atrophy. Multifocal T2 FLAIR hyperintense signal abnormality within the cerebral white matter, nonspecific but compatible with mild chronic small vessel ischemic disease. There is no acute infarct. No evidence of an intracranial mass. No chronic intracranial blood products. No extra-axial fluid collection. No midline shift. Vascular: Maintained flow voids within the proximal large arterial vessels. Skull and upper  cervical spine: No focal suspicious marrow lesion. Sinuses/Orbits: No mass or acute finding within the imaged orbits. Mild mucosal thickening within the left maxillary sinus. Other: Bilateral mastoid effusions (larger on the left). MRI CERVICAL SPINE FINDINGS Intermittently motion degraded exam. Most notably, the sagittal STIR sequence and axial T2 GRE sequences are moderate-to-severely motion degraded. Alignment: Straightening of the expected cervical lordosis. No significant spondylolisthesis. Vertebrae: Vertebral body height is maintained. No significant marrow edema or focal suspicious osseous lesion is identified. Small multilevel vertebral body hemangiomas. Cord: Within the limitations of motion degradation, no signal abnormality is identified within the cervical spinal cord. Posterior Fossa, vertebral arteries, paraspinal tissues: Posterior fossa assessed on same-day brain MRI. Flow voids preserved within the imaged cervical vertebral arteries. No paraspinal mass or collection. Disc levels: Moderate disc degeneration at C4-C5 and C5-C6. No more than mild disc degeneration at the remaining levels. C2-C3: Small central disc protrusion. No significant spinal canal or foraminal stenosis. C3-C4: Small central disc protrusion. Uncovertebral hypertrophy on the right. Mild effacement of the ventral thecal sac (without spinal cord mass effect). Mild right neural foraminal narrowing. C4-C5: Disc bulge with bilateral uncovertebral hypertrophy. Mild effacement of the ventral thecal sac (without spinal cord mass effect). Bilateral neural foraminal narrowing (moderate right, mild left). C5-C6: Disc bulge with bilateral uncovertebral hypertrophy. Mild effacement of the ventral thecal sac (without spinal cord mass effect). Bilateral neural foraminal narrowing (severe right, moderate/severe left). C6-C7: Slight disc bulge. No significant spinal canal or foraminal stenosis. C7-T1: Slight disc bulge. No significant spinal canal  or foraminal stenosis. IMPRESSION: MRI brain: 1.  No evidence of an acute intracranial abnormality. 2. Mild chronic small vessel ischemic changes within the cerebral white matter. 3. Mild generalized cerebral atrophy. 4. Mild mucosal thickening within the left maxillary sinus. 5. Bilateral mastoid effusions. MRI cervical spine: 1. Motion degraded examination, limiting evaluation. 2. Cervical spondylosis, as outlined. 3. No more than mild spinal canal stenosis. 4. Multilevel foraminal stenosis, as detailed and greatest on the right at C4-C5 (moderate) and bilaterally at C5-C6 (severe right, moderate/severe left). 5. Disc degeneration is greatest at C4-C5 and C5-C6 (moderate at these levels). Electronically Signed   By: Kellie Simmering D.O.  On: 06/01/2022 12:00     Medications:    sodium chloride Stopped (06/01/22 1823)   cefTRIAXone (ROCEPHIN)  IV Stopped (06/02/22 1653)   potassium PHOSPHATE IVPB (in mmol) 30 mmol (06/03/22 1018)    aspirin EC  81 mg Oral Daily   atorvastatin  80 mg Oral Daily   feeding supplement  237 mL Oral TID BM   guaiFENesin  600 mg Oral BID   heparin  5,000 Units Subcutaneous Q8H   methylPREDNISolone (SOLU-MEDROL) injection  40 mg Intravenous Daily   metoprolol succinate  25 mg Oral Daily   multivitamin with minerals  1 tablet Oral Daily   spironolactone  12.5 mg Oral Daily   sodium chloride, acetaminophen, docusate sodium, guaiFENesin-dextromethorphan, ipratropium-albuterol, ondansetron (ZOFRAN) IV, mouth rinse, polyethylene glycol  Assessment/ Plan:  Ms. Anay Ratts is a 62 y.o.  female with past medical conditions including hypertension and tobacco use, who was admitted to [facility] on 05/31/2022 for Lactic acidosis [E87.20] Hyponatremia [E87.1] Weakness [R53.1] COPD exacerbation ([place]) [J44.1] NSTEMI (non-ST elevated myocardial infarction) ([place]) [I21.4] Chronic obstructive pulmonary disease, unspecified COPD type ([place]) [J44.9]   Hyponatremia, severe. Symptomatic.  Sodium 110 on ED arrival. Likely due to poor oral intake and HCTZ.  Sodium level improving with oral intake. 126 today. Continue to hold iVF and encourage oral intake.     LOS: 3 Tarena Gockley 2/28/202411:08 AM

## 2022-06-03 NOTE — Assessment & Plan Note (Addendum)
MRI of the brain negative for stroke.  Continue PT and OT evaluations.  Will need an outpatient EMG as per neurology

## 2022-06-03 NOTE — Progress Notes (Signed)
Progress Note  Patient Name: Katie Moody Date of Encounter: 06/03/2022  Primary Cardiologist: new consult by End  Subjective   No chest pain, dyspnea, palpitations, dizziness, presyncope, or syncope. She has completed 48 hours of heparin. Sodium remains low at 126.  Inpatient Medications    Scheduled Meds:  aspirin EC  81 mg Oral Daily   atorvastatin  80 mg Oral Daily   feeding supplement  237 mL Oral TID BM   guaiFENesin  600 mg Oral BID   methylPREDNISolone (SOLU-MEDROL) injection  40 mg Intravenous Daily   metoprolol succinate  12.5 mg Oral Daily   multivitamin with minerals  1 tablet Oral Daily   Continuous Infusions:  sodium chloride Stopped (06/01/22 1823)   cefTRIAXone (ROCEPHIN)  IV Stopped (06/02/22 1653)   heparin 900 Units/hr (06/03/22 0413)   potassium PHOSPHATE IVPB (in mmol)     PRN Meds: sodium chloride, acetaminophen, docusate sodium, guaiFENesin-dextromethorphan, ipratropium-albuterol, ondansetron (ZOFRAN) IV, mouth rinse, polyethylene glycol   Vital Signs    Vitals:   06/02/22 1652 06/02/22 2016 06/03/22 0000 06/03/22 0300  BP: 107/72 92/69 104/67   Pulse: 89 85 73   Resp: '18 16 18   '$ Temp: 98.5 F (36.9 C) 98.2 F (36.8 C) 98 F (36.7 C)   TempSrc: Oral Oral    SpO2: 98% 100% 100%   Weight:    49.2 kg  Height:        Intake/Output Summary (Last 24 hours) at 06/03/2022 0901 Last data filed at 06/02/2022 2250 Gross per 24 hour  Intake 688.49 ml  Output 600 ml  Net 88.49 ml    Filed Weights   06/01/22 0500 06/02/22 0500 06/03/22 0300  Weight: 49.3 kg 52.1 kg 49.2 kg    Telemetry    Sinus rhythm with rare PVCs and T wave inversion - Personally Reviewed  ECG    NSR, 90 bpm, possible prior anterior infarct, diffuse TWI, QTc 518 - Personally Reviewed  Physical Exam   GEN: No acute distress.   Neck: No JVD. Cardiac: RRR, no murmurs, rubs, or gallops.  Respiratory: Clear to auscultation bilaterally.  GI: Soft, nontender,  non-distended.   MS: No edema; No deformity.   Neuro:  Alert and oriented x 3. Able to lift left upper extremity some. Psych: Normal affect.  Labs    Chemistry Recent Labs  Lab 05/31/22 1628 05/31/22 1629 06/01/22 0458 06/01/22 0906 06/02/22 0510 06/02/22 1052 06/02/22 1627 06/02/22 2258 06/03/22 0434  NA 110*   < > 117*   < > 124*   < > 124* 125* 126*  K 2.8*   < > 3.4*   < > 3.5   < > 4.1 4.2 4.1  CL <65*   < > 80*   < > 85*   < > 85* 84* 86*  CO2 33*   < > 26   < > 29   < > 31 35* 33*  GLUCOSE 101*   < > 85   < > 128*   < > 166* 159* 123*  BUN 10   < > 9   < > 7*   < > 7* 7* 6*  CREATININE 0.68   < > 0.60   < > 0.39*   < > 0.45 0.42* 0.38*  CALCIUM 8.4*   < > 7.9*   < > 8.0*   < > 8.1* 8.1* 8.3*  PROT 6.4*  --  5.6*  --  5.6*  --   --   --   --  ALBUMIN 3.2*  --  2.7*  --  2.7*  --   --   --   --   AST 29  --  25  --  21  --   --   --   --   ALT 14  --  14  --  14  --   --   --   --   ALKPHOS 67  --  60  --  62  --   --   --   --   BILITOT 1.3*  --  1.0  --  0.8  --   --   --   --   GFRNONAA >60   < > >60   < > >60   < > >60 >60 >60  ANIONGAP NOT CALCULATED   < > 11   < > 10   < > '8 6 7   '$ < > = values in this interval not displayed.      Hematology Recent Labs  Lab 06/01/22 0906 06/02/22 0510 06/03/22 0434  WBC 19.7* 22.2* 14.7*  RBC 3.82* 3.56* 3.61*  HGB 11.5* 10.9* 10.9*  HCT 32.2* 30.6* 32.0*  MCV 84.3 86.0 88.6  MCH 30.1 30.6 30.2  MCHC 35.7 35.6 34.1  RDW 11.2* 11.3* 11.5  PLT 383 364 353     Cardiac EnzymesNo results for input(s): "TROPONINI" in the last 168 hours. No results for input(s): "TROPIPOC" in the last 168 hours.   BNPNo results for input(s): "BNP", "PROBNP" in the last 168 hours.   DDimer No results for input(s): "DDIMER" in the last 168 hours.   Radiology    MR BRAIN WO CONTRAST  Result Date: 06/01/2022 IMPRESSION: MRI brain: 1.  No evidence of an acute intracranial abnormality. 2. Mild chronic small vessel ischemic changes  within the cerebral white matter. 3. Mild generalized cerebral atrophy. 4. Mild mucosal thickening within the left maxillary sinus. 5. Bilateral mastoid effusions. MRI cervical spine: 1. Motion degraded examination, limiting evaluation. 2. Cervical spondylosis, as outlined. 3. No more than mild spinal canal stenosis. 4. Multilevel foraminal stenosis, as detailed and greatest on the right at C4-C5 (moderate) and bilaterally at C5-C6 (severe right, moderate/severe left). 5. Disc degeneration is greatest at C4-C5 and C5-C6 (moderate at these levels). Electronically Signed   By: Kellie Simmering D.O.   On: 06/01/2022 12:00   MR CERVICAL SPINE WO CONTRAST  Result Date: 06/01/2022 IMPRESSION: MRI brain: 1.  No evidence of an acute intracranial abnormality. 2. Mild chronic small vessel ischemic changes within the cerebral white matter. 3. Mild generalized cerebral atrophy. 4. Mild mucosal thickening within the left maxillary sinus. 5. Bilateral mastoid effusions. MRI cervical spine: 1. Motion degraded examination, limiting evaluation. 2. Cervical spondylosis, as outlined. 3. No more than mild spinal canal stenosis. 4. Multilevel foraminal stenosis, as detailed and greatest on the right at C4-C5 (moderate) and bilaterally at C5-C6 (severe right, moderate/severe left). 5. Disc degeneration is greatest at C4-C5 and C5-C6 (moderate at these levels). Electronically Signed   By: Kellie Simmering D.O.   On: 06/01/2022 12:00   CT HEAD CODE STROKE WO CONTRAST`  Addendum Date: 06/01/2022   ADDENDUM REPORT: 06/01/2022 08:16 ADDENDUM: Study discussed by telephone with NP Donell Beers on 06/01/2022 at 0757 hours. Electronically Signed   By: Genevie Ann M.D.   On: 06/01/2022 08:16   Result Date: 06/01/2022 IMPRESSION: 1. Normal for age non contrast CT appearance of the brain. ASPECTS 10. 2. Left mastoid effusion, likely  postinflammatory and significance doubtful. Electronically Signed: By: Genevie Ann M.D. On: 06/01/2022 07:53   CT Angio  Chest PE W/Cm &/Or Wo Cm  Result Date: 05/31/2022 IMPRESSION: 1. No pulmonary embolism is seen. 2. There is opacification of the bilateral lower lobe posterior and lateral segmental airways, possibly from mucous plugging. There is associated bilateral lower lobe posterior and lateral segmental mild bronchial wall thickening and mild bronchiectasis. 3. There are scattered centrilobular and tree-in-bud airspace opacities within the bilateral lower lobes, medial segment of the right middle lobe, and anteromedial left upper lobe. This suggests mild multifocal airways disseminated infection, possibly chronic, and possibly related to aspiration. 4. There is reflux of contrast into the inferior vena cava and hepatic veins, possibly from right heart insufficiency. Electronically Signed   By: Yvonne Kendall M.D.   On: 05/31/2022 18:47   DG Chest Port 1 View  Result Date: 05/31/2022 IMPRESSION: 1. No focal airspace opacity. 2. Visualized upper abdomen is notable for colonic gaseous distention. If there is clinical concern for abdominal pathology, further evaluation with a dedicated abdominal radiograph is recommended. Electronically Signed   By: Marin Roberts M.D.   On: 05/31/2022 14:23    Cardiac Studies   2D echo 06/01/2022: 1. Left ventricular ejection fraction, by estimation, is 55%. The left  ventricle has normal function. The left ventricle demonstrates regional  wall motion abnormalities (see scoring diagram/findings for description).  Left ventricular diastolic  parameters are consistent with Grade I diastolic dysfunction (impaired  relaxation). There is moderate hypokinesis of the left ventricular,  mid-apical anteroseptal wall.   2. Right ventricular systolic function is normal. The right ventricular  size is normal.   3. The mitral valve is normal in structure. No evidence of mitral valve  regurgitation.   4. The aortic valve is tricuspid. Aortic valve regurgitation is not  visualized.    Patient Profile     62 y.o. female with history of HTN and tobacco use who is being seen today for the evaluation of elevated troponin at the request of Dr. Cyndia Skeeters.   Assessment & Plan    1. 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, though wall motion abnormality noted on echo is not typical for this -Echo with preserved LV systolic function with moderate hypokinesis of the left ventricular mid apical, anteroseptal wall -She has completed 48 hours of heparin, this may be stopped (will place patient of DVT PPX) -ASA -Favor proceeding with cardiac cath once her respiratory status and electrolyte derangements are improved, however sodium remains significantly low and will need further improvement prior to proceeding with invasive cardiac testing unless she were to become unstable -Defer addition of beta-blocker or ACE inhibitor given periodic hypotension -LDL 39, on atorvastatin   Remaining comorbidities and electrolyte abnormalities per primary service.       For questions or updates, please contact [place] Please consult [email or web address] for contact info under Cardiology/STEMI.    Signed, Christell Faith, PA-C [place] Pager: [phone] 06/03/2022, 9:01 AM

## 2022-06-03 NOTE — Assessment & Plan Note (Deleted)
pCO2 62 on blood gas.  Will get pulse ox on room air.

## 2022-06-03 NOTE — Progress Notes (Signed)
Physical Therapy Treatment Patient Details Name: Katie Moody MRN: [id number] DOB: 29-Jan-1961 Today's Date: 06/03/2022   History of Present Illness Pt is a 62 year old F with PMH of HTN and tobacco use disorder presenting with shortness of breath and productive cough for 5 days and generalized weakness for 2 days, dizzy and fall without trauma or loss of consciousness. MD assessment includes: Acute respiratory failure with hypercapnia, severe hyponatremia, NSTEMI, hypokalemia, noted LUE weakness with MRI negative for stroke, and lactic acidosis.    PT Comments    Pt was pleasant but required encouragement and education on physiological benefits of activity to participate during the session.  Pt required physical assistance with log roll training for BLE and trunk control and min A to come to standing from an elevated surface with cues for hand placement.  Pt was able amb 2 x 6' at the EOB with seated therapeutic rest breaks secondary to fatigue.  Pt ambulated with heavy lean on the RW for support and with noted min instability but the pt was able to self-correct without physical assist.  Pt will benefit from PT services in a SNF setting upon discharge to safely address deficits listed in patient problem list for decreased caregiver assistance and eventual return to PLOF.      Recommendations for follow up therapy are one component of a multi-disciplinary discharge planning process, led by the attending physician.  Recommendations may be updated based on patient status, additional functional criteria and insurance authorization.  Follow Up Recommendations  Skilled nursing-short term rehab (<3 hours/day) Can patient physically be transported by private vehicle: Yes   Assistance Recommended at Discharge Frequent or constant Supervision/Assistance  Patient can return home with the following A lot of help with walking and/or transfers;A lot of help with bathing/dressing/bathroom;Assistance with  cooking/housework;Assist for transportation;Help with stairs or ramp for entrance   Equipment Recommendations  None recommended by PT    Recommendations for Other Services       Precautions / Restrictions Precautions Precautions: Fall Restrictions Weight Bearing Restrictions: No     Mobility  Bed Mobility Overal bed mobility: Needs Assistance Bed Mobility: Rolling, Sidelying to Sit Rolling: Min assist Sidelying to sit: Min assist       General bed mobility comments: Log roll training for decreased caregiver assistance with min A for BLE and trunk control    Transfers Overall transfer level: Needs assistance Equipment used: Rolling walker (2 wheels) Transfers: Sit to/from Stand Sit to Stand: Min assist, From elevated surface           General transfer comment: Min A to come to full upright position with cues for hand placement    Ambulation/Gait Ambulation/Gait assistance: Min guard Gait Distance (Feet): 6 Feet x 2 Assistive device: Rolling walker (2 wheels) Gait Pattern/deviations: Step-to pattern, Trunk flexed, Decreased step length - left, Decreased step length - right Gait velocity: decreased     General Gait Details: Pt with min instability during gait and with heavy lean on the RW for support but no physical assist needed to prevent LOB   Stairs             Wheelchair Mobility    Modified Rankin (Stroke Patients Only)       Balance Overall balance assessment: Needs assistance Sitting-balance support: Feet supported Sitting balance-Leahy Scale: Good     Standing balance support: During functional activity, Bilateral upper extremity supported Standing balance-Leahy Scale: Poor  Cognition Arousal/Alertness: Awake/alert Behavior During Therapy: WFL for tasks assessed/performed Overall Cognitive Status: Within Functional Limits for tasks assessed                                           Exercises Total Joint Exercises Ankle Circles/Pumps: AROM, Strengthening, Both, 10 reps Quad Sets: Strengthening, Both, 10 reps Hip ABduction/ADduction: AROM, Strengthening, Both, 5 reps, AAROM Straight Leg Raises: AROM, Strengthening, Both, 5 reps, AAROM Long Arc Quad: AROM, Strengthening, Both, 10 reps Knee Flexion: AROM, Strengthening, Both, 10 reps Other Exercises: Static unsupported sitting at the EOB x 5 min for improved activity tolerance Other Exercises: Static standing 2 x 3-4 min for improved activity tolerance    General Comments        Pertinent Vitals/Pain Pain Assessment Pain Assessment: No/denies pain    Home Living                          Prior Function            PT Goals (current goals can now be found in the care plan section) Progress towards PT goals: Progressing toward goals    Frequency    Min 2X/week      PT Plan Current plan remains appropriate    Co-evaluation              AM-PAC PT "6 Clicks" Mobility   Outcome Measure  Help needed turning from your back to your side while in a flat bed without using bedrails?: A Little Help needed moving from lying on your back to sitting on the side of a flat bed without using bedrails?: A Little Help needed moving to and from a bed to a chair (including a wheelchair)?: A Little Help needed standing up from a chair using your arms (e.g., wheelchair or bedside chair)?: A Little Help needed to walk in hospital room?: A Lot Help needed climbing 3-5 steps with a railing? : Total 6 Click Score: 15    End of Session Equipment Utilized During Treatment: Gait belt;Oxygen Activity Tolerance: Patient tolerated treatment well Patient left: in chair;with call bell/phone within reach;with chair alarm set Nurse Communication: Mobility status PT Visit Diagnosis: Unsteadiness on feet (R26.81);Difficulty in walking, not elsewhere classified (R26.2);Muscle weakness (generalized) (M62.81)      Time: [id number] PT Time Calculation (min) (ACUTE ONLY): 35 min  Charges:  $Therapeutic Exercise: 8-22 mins $Therapeutic Activity: 8-22 mins                     D. Scott Chyrel Taha PT, DPT 06/03/22, 3:43 PM

## 2022-06-03 NOTE — Hospital Course (Addendum)
63 year old F with PMH of HTN and tobacco use disorder presenting with shortness of breath and productive cough for 5 days and generalized weakness for 2 days, dizzy nests and fall without trauma or loss of consciousness, and admitted to ICU with severe hyponatremia, hypokalemia, bibasilar pneumonia with mucous plugging and non-STEMI.   In ED, vital stable except for mild tachycardia. Na 110. K 2.8.  Lactic acid 3.0.  CXR without focal airspace opacity.  CTA chest negative for PE but opacification of the bilateral lower lobe posterior and lateral segmental airways concerning for mucous plugging.  CTA chest also showed scattered centrilobular and tree-in-bud airspace opacities within the bilateral lower lobes, medial segment of RML and anteromedial LUL.  Patient was started on IV NS fluid, IV Solu-Medrol, DuoNeb, antibiotic for CAP coverage and IV heparin for non-STEMI, and admitted to ICU.  Nephrology consulted for hyponatremia.    Patient's care transferred to [facility] hospitalist service on 2/26.  Code stroke activated the morning of 2/26 due to LUE weakness.  CT head and MRI without acute finding.  MRI cervical spine with some foraminal stenosis.  2/29.  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  Sodium up to 133. 3/1.  Sodium 132.  Patient only walked 8 feet with physical therapy.  Since patient does not have insurance, will have to go home with home health at some point.  Also unable to come off oxygen today. 3/2: Currently saturating well on 1 L of oxygen, we will try to completely wean off.  Also need to be ambulated with pulse ox.

## 2022-06-03 NOTE — Consult Note (Signed)
ANTICOAGULATION CONSULT NOTE  Pharmacy Consult for heparin Indication: ACS/STEMI  No Known Allergies  Patient Measurements: Height: '5\' 8"'$  (172.7 cm) Weight: 49.2 kg (108 lb 7.5 oz) IBW/kg (Calculated) : 63.9 Heparin Dosing Weight: 47.6 kg  Vital Signs: Temp: 98 F (36.7 C) (02/28 0000) Temp Source: Oral (02/27 2016) BP: 104/67 (02/28 0000) Pulse Rate: 73 (02/28 0000)  Labs: Recent Labs    05/31/22 1456 05/31/22 1458 05/31/22 1628 05/31/22 1628 05/31/22 1629 05/31/22 2004 06/01/22 0906 06/01/22 1751 06/02/22 0148 06/02/22 0510 06/02/22 0834 06/02/22 1052 06/02/22 1627 06/02/22 2258 06/03/22 0434  HGB 14.0  --   --   --   --   --  11.5*  --   --  10.9*  --   --   --   --  10.9*  HCT RESULTS UNAVAILABLE DUE TO INTERFERING SUBSTANCE  --   --   --   --   --  32.2*  --   --  30.6*  --   --   --   --  32.0*  PLT 395  --   --   --   --   --  383  --   --  364  --   --   --   --  353  APTT  --  29  --   --   --   --   --   --   --   --   --   --   --   --   --   LABPROT  --  13.9  --   --   --   --   --   --   --   --   --   --   --   --   --   INR  --  1.1  --   --   --   --   --   --   --   --   --   --   --   --   --   HEPARINUNFRC  --   --   --   --   --    < > 0.10*   < > 0.35  --  0.33  --   --   --  0.43  CREATININE  --   --  0.68   < > 0.64   < > 0.59   < >  --  0.39*  --    < > 0.45 0.42* 0.38*  CKTOTAL  --   --   --   --   --   --   --   --   --  111  --   --   --   --   --   TROPONINIHS 935*  --  1,582*  --  1,011*  --   --   --   --   --   --   --   --   --   --    < > = values in this interval not displayed.     Estimated Creatinine Clearance: 55.9 mL/min (A) (by C-G formula based on SCr of 0.38 mg/dL (L)).   Medical History: Past Medical History:  Diagnosis Date   Hypertension     Medications:  No evidence of PTA anticoagulation  Assessment: Pharmacy consulted to dose heparin in this 62 yo F who presents to the ED with SOB/chest pain.  Troponin  935.  2/26 1750 HL < 0.10 Subtherapeutic  2/27 0148 HL = 0.35, therapeutic X 1  2/27 0834 HL = 0.33  2/28 0434 HL = 0.43, therapeutic X 3  Goal of Therapy:  Heparin level 0.3-0.7 units/ml Monitor platelets by anticoagulation protocol: Yes  Plan:  2/28:  HL @ 0434 = 0.43, therapeutic X 3  Will continue pt on current rate and recheck HL on 2/29 with AM labs.    Orene Desanctis, PharmD  Clinical Pharmacist   06/03/2022 7:08 AM

## 2022-06-03 NOTE — Assessment & Plan Note (Addendum)
Continue supplements.  Underweight with a BMI of 17.36 with current height and weight in computer.

## 2022-06-03 NOTE — Assessment & Plan Note (Signed)
Unclear if this is NSTEMI or not.  Patient completed 48 hours of heparin.  Continue aspirin.  Cardiology will do cardiac catheterization today

## 2022-06-03 NOTE — Assessment & Plan Note (Addendum)
Secondary to hydrochlorothiazide.  Last sodium 132.

## 2022-06-03 NOTE — Progress Notes (Signed)
Progress Note   Patient: Katie Moody [id number] DOB: [birth year] DOA: 05/31/2022     3 DOS: the patient was seen and examined on 06/03/2022   Brief hospital course: 62 year old F with PMH of HTN and tobacco use disorder presenting with shortness of breath and productive cough for 5 days and generalized weakness for 2 days, dizzy nests and fall without trauma or loss of consciousness, and admitted to ICU with severe hyponatremia, hypokalemia, bibasilar pneumonia with mucous plugging and non-STEMI.   In ED, vital stable except for mild tachycardia. Na 110. K 2.8.  Lactic acid 3.0.  CXR without focal airspace opacity.  CTA chest negative for PE but opacification of the bilateral lower lobe posterior and lateral segmental airways concerning for mucous plugging.  CTA chest also showed scattered centrilobular and tree-in-bud airspace opacities within the bilateral lower lobes, medial segment of RML and anteromedial LUL.  Patient was started on IV NS fluid, IV Solu-Medrol, DuoNeb, antibiotic for CAP coverage and IV heparin for non-STEMI, and admitted to ICU.  Nephrology consulted for hyponatremia.    Patient's care transferred to [facility] hospitalist service on 2/26.  Code stroke activated the morning of 2/26 due to LUE weakness.  CT head and MRI without acute finding.  MRI cervical spine with some foraminal stenosis.  Assessment and Plan: * Severe sepsis ([place]) Present on admission.  On Rocephin and Zithromax for 5 days.  Patient also on Solu-Medrol.  CAP (community acquired pneumonia) Multifocal pneumonia seen on CT scan.  Hyponatremia Secondary to hydrochlorothiazide.  Last sodium 127.  Myocardial ischemia Unclear if this is NSTEMI or not.  Patient completed 48 hours of heparin.  Continue aspirin.  Cardiology will do cardiac catheterization prior to disposition.  Left arm weakness MRI of the brain negative for stroke.  Continue PT and OT evaluations.  Will need an outpatient  EMG.  Malnutrition of moderate degree Continue supplements  Acute respiratory failure with hypercapnia (HCC) pCO2 62 on blood gas.  Will get pulse ox on room air.        Subjective: Patient does complain of a little shortness of breath when she moves around.  No complaints of chest pain.  Initially admitted with respiratory failure and pneumonia.  Physical Exam: Vitals:   06/03/22 0000 06/03/22 0300 06/03/22 0933 06/03/22 1237  BP: 104/67  101/66 109/73  Pulse: 73  80 89  Resp: '18  18 18  '$ Temp: 98 F (36.7 C)  97.8 F (36.6 C) 98.4 F (36.9 C)  TempSrc:   Oral Oral  SpO2: 100%  98% 95%  Weight:  49.2 kg    Height:       Physical Exam HENT:     Head: Normocephalic.     Mouth/Throat:     Pharynx: No oropharyngeal exudate.  Eyes:     General: Lids are normal.     Conjunctiva/sclera: Conjunctivae normal.  Cardiovascular:     Rate and Rhythm: Normal rate and regular rhythm.     Heart sounds: Normal heart sounds, S1 normal and S2 normal.  Pulmonary:     Breath sounds: Examination of the right-lower field reveals decreased breath sounds. Examination of the left-lower field reveals decreased breath sounds. Decreased breath sounds present. No wheezing, rhonchi or rales.  Abdominal:     Palpations: Abdomen is soft.     Tenderness: There is no abdominal tenderness.  Musculoskeletal:     Right lower leg: No swelling.     Left lower leg: No swelling.  Skin:  General: Skin is warm.     Findings: No rash.  Neurological:     Mental Status: She is alert and oriented to person, place, and time.     Data Reviewed: Sodium 127, creatinine 0.5, CO2 33, chloride 86, white blood cell count 14.7, hemoglobin 10.9  Family Communication: Declined earlier  Disposition: Status is: Inpatient Remains inpatient appropriate because: Awaiting sodium to come off prior to cardiac catheterization  Planned Discharge Destination: Home    Time spent: 28 minutes  Author: Loletha Grayer, MD 06/03/2022 3:03 PM  For on call review [email or web address].

## 2022-06-03 NOTE — TOC Initial Note (Addendum)
Transition of Care [facility]) - Initial/Assessment Note    Patient Details  Name: Katie Moody MRN: [id number] Date of Birth: 27-Aug-1960  Transition of Care [facility] CM/SW Contact:    Laurena Slimmer, RN Phone Number: 06/03/2022, 9:55 AM  Clinical Narrative:                 Spoke with patient regarding  therapy recommendation and DME. Patient is not agreeable to SNF. She stated she's going home. She is agreeable to [facility]  and does not have a preference. She was advised a referral would be sent to a hospital preferred [facility] provider. Her sister will be available to assist her in her home. Patient stated she has not had to have oxygen in the past. She would like to speak with the MD regarding oxygen needs. MD notified.         Patient Goals and CMS Choice            Expected Discharge Plan and Services                                              Prior Living Arrangements/Services                       Activities of Daily Living      Permission Sought/Granted                  Emotional Assessment              Admission diagnosis:  Lactic acidosis [E87.20] Hyponatremia [E87.1] Weakness [R53.1] COPD exacerbation (HCC) [J44.1] NSTEMI (non-ST elevated myocardial infarction) ([place]) [I21.4] Chronic obstructive pulmonary disease, unspecified COPD type ([place]) [J44.9] Patient Active Problem List   Diagnosis Date Noted   Malnutrition of moderate degree 06/03/2022   CAP (community acquired pneumonia) 06/01/2022   Acute respiratory failure with hypercapnia ([place]) 06/01/2022   NSTEMI (non-ST elevated myocardial infarction) ([place]) 06/01/2022   Left hemiparesis ([place]) 06/01/2022   Tobacco use disorder 06/01/2022   Physical deconditioning 06/01/2022   Lactic acidosis 06/01/2022   Bandemia 06/01/2022   Severe sepsis ([place]) 06/01/2022   Hyponatremia 05/31/2022   PCP:  [facility] [place]:  No Pharmacies Listed    Social Determinants  of Health (SDOH) Social History: SDOH Screenings   Food Insecurity: No Food Insecurity (02/09/2022)  Transportation Needs: No Transportation Needs (02/09/2022)  Tobacco Use: Low Risk  (05/31/2022)   SDOH Interventions:     Readmission Risk Interventions     No data to display

## 2022-06-04 ENCOUNTER — Encounter
Admission: EM | Disposition: A | Discharge status: Home Health | Payer: Self-pay | Source: Home / Self Care | Attending: Internal Medicine

## 2022-06-04 DIAGNOSIS — I2510 Atherosclerotic heart disease of native coronary artery without angina pectoris: Secondary | ICD-10-CM

## 2022-06-04 DIAGNOSIS — R7989 Other specified abnormal findings of blood chemistry: Secondary | ICD-10-CM

## 2022-06-04 DIAGNOSIS — J441 Chronic obstructive pulmonary disease with (acute) exacerbation: Secondary | ICD-10-CM

## 2022-06-04 DIAGNOSIS — J449 Chronic obstructive pulmonary disease, unspecified: Secondary | ICD-10-CM

## 2022-06-04 HISTORY — PX: LEFT HEART CATH AND CORONARY ANGIOGRAPHY: CATH118249

## 2022-06-04 LAB — CBC
HCT: 34.2 % — ABNORMAL LOW (ref 36.0–46.0)
Hemoglobin: 11 g/dL — ABNORMAL LOW (ref 12.0–15.0)
MCH: 30.2 pg (ref 26.0–34.0)
MCHC: 32.2 g/dL (ref 30.0–36.0)
MCV: 94 fL (ref 80.0–100.0)
Platelets: 347 10*3/uL (ref 150–400)
RBC: 3.64 MIL/uL — ABNORMAL LOW (ref 3.87–5.11)
RDW: 11.6 % (ref 11.5–15.5)
WBC: 14.6 10*3/uL — ABNORMAL HIGH (ref 4.0–10.5)
nRBC: 0 % (ref 0.0–0.2)

## 2022-06-04 LAB — BASIC METABOLIC PANEL
Anion gap: 7 (ref 5–15)
BUN: 11 mg/dL (ref 8–23)
CO2: 38 mmol/L — ABNORMAL HIGH (ref 22–32)
Calcium: 8.9 mg/dL (ref 8.9–10.3)
Chloride: 88 mmol/L — ABNORMAL LOW (ref 98–111)
Creatinine, Ser: 0.38 mg/dL — ABNORMAL LOW (ref 0.44–1.00)
GFR, Estimated: >60 mL/min (ref >60)
Glucose, Bld: 110 mg/dL — ABNORMAL HIGH (ref 70–99)
Potassium: 5.1 mmol/L (ref 3.5–5.1)
Sodium: 133 mmol/L — ABNORMAL LOW (ref 135–145)

## 2022-06-04 LAB — PHOSPHORUS: Phosphorus: 3.1 mg/dL (ref 2.5–4.6)

## 2022-06-04 SURGERY — LEFT HEART CATH AND CORONARY ANGIOGRAPHY
Anesthesia: Moderate Sedation

## 2022-06-04 MED ORDER — IOHEXOL 300 MG/ML  SOLN
INTRAMUSCULAR | Status: DC | PRN
  Administered 2022-06-04: 50 mL

## 2022-06-04 MED ORDER — SODIUM CHLORIDE 0.9% FLUSH: 3.0000 mL | INTRAVENOUS | Status: DC | PRN

## 2022-06-04 MED ORDER — MIDAZOLAM HCL 2 MG/2ML IJ SOLN
INTRAMUSCULAR | Status: DC | PRN
  Administered 2022-06-04: 1 mg via INTRAVENOUS

## 2022-06-04 MED ORDER — HYDRALAZINE HCL 20 MG/ML IJ SOLN: 10.0000 mg | INTRAMUSCULAR | Status: AC | PRN

## 2022-06-04 MED ORDER — SODIUM CHLORIDE 0.9 % IV SOLN: 250.0000 mL | INTRAVENOUS | Status: DC | PRN

## 2022-06-04 MED ORDER — HEPARIN SODIUM (PORCINE) 1000 UNIT/ML IJ SOLN
INTRAMUSCULAR | Status: AC
  Filled 2022-06-04: qty 10

## 2022-06-04 MED ORDER — HEPARIN SODIUM (PORCINE) 1000 UNIT/ML IJ SOLN
INTRAMUSCULAR | Status: DC | PRN
  Administered 2022-06-04: 3000 [IU] via INTRAVENOUS

## 2022-06-04 MED ORDER — SODIUM CHLORIDE 0.9% FLUSH
3.0000 mL | Freq: Two times a day (BID) | INTRAVENOUS | Status: DC
  Administered 2022-06-04 – 2022-06-06 (×4): 3 mL via INTRAVENOUS

## 2022-06-04 MED ORDER — ASPIRIN 81 MG PO CHEW: 81.0000 mg | CHEWABLE_TABLET | ORAL | Status: DC

## 2022-06-04 MED ORDER — SODIUM CHLORIDE 0.9% FLUSH
3.0000 mL | Freq: Two times a day (BID) | INTRAVENOUS | Status: DC
  Administered 2022-06-04 – 2022-06-08 (×7): 3 mL via INTRAVENOUS

## 2022-06-04 MED ORDER — SODIUM CHLORIDE 0.9 % IV SOLN: INTRAVENOUS | Status: AC

## 2022-06-04 MED ORDER — LABETALOL HCL 5 MG/ML IV SOLN: 10.0000 mg | INTRAVENOUS | Status: AC | PRN

## 2022-06-04 MED ORDER — VERAPAMIL HCL 2.5 MG/ML IV SOLN
INTRAVENOUS | Status: AC
  Filled 2022-06-04: qty 2

## 2022-06-04 MED ORDER — SODIUM CHLORIDE 0.9 % WEIGHT BASED INFUSION: 1.0000 mL/kg/h | INTRAVENOUS | Status: DC

## 2022-06-04 MED ORDER — SODIUM CHLORIDE 0.9 % WEIGHT BASED INFUSION: 3.0000 mL/kg/h | INTRAVENOUS | Status: DC

## 2022-06-04 MED ORDER — MIDAZOLAM HCL 2 MG/2ML IJ SOLN
INTRAMUSCULAR | Status: AC
  Filled 2022-06-04: qty 2

## 2022-06-04 MED ORDER — FENTANYL CITRATE (PF) 100 MCG/2ML IJ SOLN
INTRAMUSCULAR | Status: AC
  Filled 2022-06-04: qty 2

## 2022-06-04 MED ORDER — HEPARIN (PORCINE) IN NACL 2000-0.9 UNIT/L-% IV SOLN
INTRAVENOUS | Status: DC | PRN
  Administered 2022-06-04: 1000 mL

## 2022-06-04 MED ORDER — HEPARIN (PORCINE) IN NACL 1000-0.9 UT/500ML-% IV SOLN
INTRAVENOUS | Status: AC
  Filled 2022-06-04: qty 1000

## 2022-06-04 MED ORDER — VERAPAMIL HCL 2.5 MG/ML IV SOLN
INTRAVENOUS | Status: DC | PRN
  Administered 2022-06-04: 2.5 mg via INTRA_ARTERIAL

## 2022-06-04 MED ORDER — FENTANYL CITRATE (PF) 100 MCG/2ML IJ SOLN
INTRAMUSCULAR | Status: DC | PRN
  Administered 2022-06-04: 25 ug via INTRAVENOUS

## 2022-06-04 SURGICAL SUPPLY — 10 items
CATH 5F 110X4 TIG (CATHETERS) IMPLANT
DEVICE RAD TR BAND REGULAR (VASCULAR PRODUCTS) IMPLANT
DRAPE BRACHIAL (DRAPES) IMPLANT
GLIDESHEATH SLEND SS 6F .021 (SHEATH) IMPLANT
GUIDEWIRE INQWIRE 1.5J.035X260 (WIRE) IMPLANT
INQWIRE 1.5J .035X260CM (WIRE) ×1
PACK CARDIAC CATH (CUSTOM PROCEDURE TRAY) ×1 IMPLANT
PROTECTION STATION PRESSURIZED (MISCELLANEOUS) ×1
SET ATX-X65L (MISCELLANEOUS) IMPLANT
STATION PROTECTION PRESSURIZED (MISCELLANEOUS) IMPLANT

## 2022-06-04 NOTE — H&P (View-Only) (Signed)
Rounding Note    Patient Name: Katie Moody Date of Encounter: 06/04/2022  [place] Cardiologist: None   Subjective   Patient seen on AM rounds. Denies any chest pain, worsening dyspnea, or palpitations. Serum sodium has improved up to 133 this morning. Remains on 1L of O2 via [place]. She is able to lay flat in the bed but she endorses back discomfort with the mattress. No changes were noted with her breathing.  Inpatient Medications    Scheduled Meds:  [START ON 06/05/2022] aspirin  81 mg Oral Pre-Cath   aspirin EC  81 mg Oral Daily   atorvastatin  80 mg Oral Daily   feeding supplement  237 mL Oral TID BM   guaiFENesin  600 mg Oral BID   heparin  5,000 Units Subcutaneous Q8H   methylPREDNISolone (SOLU-MEDROL) injection  40 mg Intravenous Daily   metoprolol succinate  25 mg Oral Daily   multivitamin with minerals  1 tablet Oral Daily   spironolactone  12.5 mg Oral Daily   Continuous Infusions:  sodium chloride Stopped (06/01/22 1823)   sodium chloride     [START ON 06/05/2022] sodium chloride     Followed by   Derrill Memo ON 06/05/2022] sodium chloride     cefTRIAXone (ROCEPHIN)  IV Stopped (06/03/22 1714)   PRN Meds: sodium chloride, sodium chloride, acetaminophen, docusate sodium, guaiFENesin-dextromethorphan, ipratropium-albuterol, ondansetron (ZOFRAN) IV, mouth rinse, polyethylene glycol, sodium chloride flush   Vital Signs    Vitals:   06/04/22 0000 06/04/22 0345 06/04/22 0500 06/04/22 0909  BP: 117/73 118/76  131/81  Pulse: 76 75  84  Resp:    18  Temp: 97.7 F (36.5 C) 97.7 F (36.5 C)  98.2 F (36.8 C)  TempSrc: Oral Oral  Oral  SpO2: 100% 100%  (!) 84%  Weight:   51.6 kg   Height:        Intake/Output Summary (Last 24 hours) at 06/04/2022 1148 Last data filed at 06/04/2022 1044 Gross per 24 hour  Intake 1423.33 ml  Output 850 ml  Net 573.33 ml      06/04/2022    5:00 AM 06/03/2022    3:00 AM 06/02/2022    5:00 AM  Last 3 Weights  Weight  (lbs) 113 lb 12.1 oz 108 lb 7.5 oz 114 lb 13.8 oz  Weight (kg) 51.6 kg 49.2 kg 52.1 kg      Telemetry     Sinus with unifocal PVC's rates 70-80- Personally Reviewed  ECG    No new tracings - Personally Reviewed  Physical Exam   GEN: No acute distress.   Neck: No JVD Cardiac: RRR, no murmurs, rubs, or gallops.  Respiratory: Clear with scattered crackles in the bases to auscultation bilaterally. Respirations remain unlabored at rest on 1L of O2 via  GI: Soft, nontender, non-distended  MS: No edema; No deformity. Neuro:  Alert and oriented, ROM limited to the left upper extremity Psych: Normal affect   Labs    High Sensitivity Troponin:   Recent Labs  Lab 05/31/22 1456 05/31/22 1628 05/31/22 1629  TROPONINIHS 935* 1,582* 1,011*     Chemistry Recent Labs  Lab 05/31/22 1628 05/31/22 1629 06/01/22 0458 06/01/22 0906 06/02/22 0510 06/02/22 1052 06/03/22 0434 06/03/22 1222 06/04/22 0604  NA 110*   < > 117*   < > 124*   < > 126* 127* 133*  K 2.8*   < > 3.4*   < > 3.5   < > 4.1 4.5 5.1  CL <65*   < > 80*   < > 85*   < > 86* 86* 88*  CO2 33*   < > 26   < > 29   < > 33* 33* 38*  GLUCOSE 101*   < > 85   < > 128*   < > 123* 211* 110*  BUN 10   < > 9   < > 7*   < > 6* 7* 11  CREATININE 0.68   < > 0.60   < > 0.39*   < > 0.38* 0.50 0.38*  CALCIUM 8.4*   < > 7.9*   < > 8.0*   < > 8.3* 8.5* 8.9  MG  --   --  1.8  --  2.2  --  2.1  --   --   PROT 6.4*  --  5.6*  --  5.6*  --   --   --   --   ALBUMIN 3.2*  --  2.7*  --  2.7*  --   --   --   --   AST 29  --  25  --  21  --   --   --   --   ALT 14  --  14  --  14  --   --   --   --   ALKPHOS 67  --  60  --  62  --   --   --   --   BILITOT 1.3*  --  1.0  --  0.8  --   --   --   --   GFRNONAA >60   < > >60   < > >60   < > >60 >60 >60  ANIONGAP NOT CALCULATED   < > 11   < > 10   < > '7 8 7   '$ < > = values in this interval not displayed.    Lipids  Recent Labs  Lab 06/02/22 0510  CHOL 98  TRIG 39  HDL 51  LDLCALC 39   CHOLHDL 1.9    Hematology Recent Labs  Lab 06/01/22 0906 06/02/22 0510 06/03/22 0434  WBC 19.7* 22.2* 14.7*  RBC 3.82* 3.56* 3.61*  HGB 11.5* 10.9* 10.9*  HCT 32.2* 30.6* 32.0*  MCV 84.3 86.0 88.6  MCH 30.1 30.6 30.2  MCHC 35.7 35.6 34.1  RDW 11.2* 11.3* 11.5  PLT 383 364 353   Thyroid  Recent Labs  Lab 05/31/22 2109  TSH 0.184*  FREET4 1.23*    BNPNo results for input(s): "BNP", "PROBNP" in the last 168 hours.  DDimer No results for input(s): "DDIMER" in the last 168 hours.   Radiology    No results found.  Cardiac Studies   2D echo 06/01/2022: 1. Left ventricular ejection fraction, by estimation, is 55%. The left  ventricle has normal function. The left ventricle demonstrates regional  wall motion abnormalities (see scoring diagram/findings for description).  Left ventricular diastolic  parameters are consistent with Grade I diastolic dysfunction (impaired  relaxation). There is moderate hypokinesis of the left ventricular,  mid-apical anteroseptal wall.   2. Right ventricular systolic function is normal. The right ventricular  size is normal.   3. The mitral valve is normal in structure. No evidence of mitral valve  regurgitation.   4. The aortic valve is tricuspid. Aortic valve regurgitation is not  visualized.   Patient Profile     61 y.o. female with history of hypertension and tobacco  use who is being seen for the evaluation of elevated troponin.  Assessment & Plan    Elevated high-sensitivity troponin -Patient remains chest pain-free since admission -Possibly supply/demand ischemia in the setting of acute presentation with severe sepsis with community-acquired pneumonia, acute hypercapnic respiratory failure, and multiple electrolyte and laboratory derangements versus ACS -Echocardiogram with preserved LV systolic function with moderate hypokinesis of the left ventricle mid apical and anterior septal wall -She did complete 48 hours of heparin -The  addition of beta-blockers or ACE inhibitors was deferred given periodic hypotension -LDL 39 continued on statin therapy with atorvastatin -Continued on aspirin 81 mg daily -With improvement in respiratory status and electrolyte derangements she is scheduled for left heart catheterization with Dr. Ellyn Hack this afternoon recommendations to follow -She will remain n.p.o. after breakfast  Community-acquired pneumonia -Multifocal pneumonia seen on CT scan -Continued Rocephin and Zithromax for 5 days -Continues to require the use of low-dose oxygen -Continue to titrate FiO2 to maintain sats equal equal to greater than 92% Management per IM  Hyponatremia severe on arrival likely related to poor oral intake and HCTZ -Serum sodium 110 on arrival -Serum sodium this a.m. 133 -Daily BMP -Continue to hold free water -Nephrology continues to follow  Hypertension -Blood pressure 131/81 -She has been continued on Toprol-XL 25 mg daily and spironolactone 12.5 mg daily -Vital signs per unit protocol  Shared Decision Making/Informed Consent The risks [stroke (1 in 1000), death (1 in 1000), kidney failure [usually temporary] (1 in 500), bleeding (1 in 200), allergic reaction [possibly serious] (1 in 200)], benefits (diagnostic support and management of coronary artery disease) and alternatives of a cardiac catheterization were discussed in detail with Ms. Adolf and she is willing to proceed.   For questions or updates, please contact [place] Please consult [email or web address] for contact info under        Signed, Jacqueli Pangallo, NP  06/04/2022, 11:48 AM

## 2022-06-04 NOTE — Plan of Care (Signed)

## 2022-06-04 NOTE — Progress Notes (Signed)
Dr. Ellyn Hack in at bedside to speak with pt. MD also called pt. Sister on phone & spoke with sister re: cath results. Both verbalized understanding of conversation.

## 2022-06-04 NOTE — Progress Notes (Signed)
[facility]  ROUNDING NOTE   Subjective:   Patient sitting up in bed, alert and oriented Appetite has returned Denies nausea or vomiting Sat in chair yesterday, weakness remains but has improved  Sodium 133  Objective:  Vital signs in last 24 hours:  Temp:  [97.7 F (36.5 C)-98.7 F (37.1 C)] 98.2 F (36.8 C) (02/29 1245) Pulse Rate:  [75-93] 93 (02/29 1245) Resp:  [16-20] 16 (02/29 1245) BP: (111-150)/(73-88) 150/88 (02/29 1245) SpO2:  [84 %-100 %] 85 % (02/29 1245) Weight:  [51.6 kg] 51.6 kg (02/29 0500)  Weight change: 2.4 kg Filed Weights   06/02/22 0500 06/03/22 0300 06/04/22 0500  Weight: 52.1 kg 49.2 kg 51.6 kg    Intake/Output: I/O last 3 completed shifts: In: 1649.4 [P.O.:480; I.V.:45.4; IV [id number] Out: 1300 [Urine:1300]   Intake/Output this shift:  Total I/O In: 600 [P.O.:600] Out: -   Physical Exam: General: NAD  Head: Normocephalic, atraumatic. Moist oral mucosal membranes  Eyes: Anicteric  Lungs:  Clear to auscultation, normal effort  Heart: Regular rate and rhythm  Abdomen:  Soft, nontender  Extremities:  No peripheral edema.  Neurologic: Alert and oriented, moving all four extremities  Skin: No lesions  Access: None    Basic Metabolic Panel: Recent Labs  Lab 06/01/22 0458 06/01/22 0906 06/02/22 0510 06/02/22 1052 06/02/22 1627 06/02/22 2258 06/03/22 0434 06/03/22 1222 06/04/22 0604  NA 117*   < > 124*   < > 124* 125* 126* 127* 133*  K 3.4*   < > 3.5   < > 4.1 4.2 4.1 4.5 5.1  CL 80*   < > 85*   < > 85* 84* 86* 86* 88*  CO2 26   < > 29   < > 31 35* 33* 33* 38*  GLUCOSE 85   < > 128*   < > 166* 159* 123* 211* 110*  BUN 9   < > 7*   < > 7* 7* 6* 7* 11  CREATININE 0.60   < > 0.39*   < > 0.45 0.42* 0.38* 0.50 0.38*  CALCIUM 7.9*   < > 8.0*   < > 8.1* 8.1* 8.3* 8.5* 8.9  MG 1.8  --  2.2  --   --   --  2.1  --   --   PHOS 3.2  --  1.7*  --   --   --  2.2*  --  3.1   < > = values in this interval not displayed.      Liver Function Tests: Recent Labs  Lab 05/31/22 1628 06/01/22 0458 06/02/22 0510  AST '29 25 21  '$ ALT '14 14 14  '$ ALKPHOS 67 60 62  BILITOT 1.3* 1.0 0.8  PROT 6.4* 5.6* 5.6*  ALBUMIN 3.2* 2.7* 2.7*    Recent Labs  Lab 05/31/22 2109  LIPASE 59*    No results for input(s): "AMMONIA" in the last 168 hours.  CBC: Recent Labs  Lab 05/31/22 1456 06/01/22 0906 06/02/22 0510 06/03/22 0434  WBC 27.7* 19.7* 22.2* 14.7*  NEUTROABS  --  17.9* 19.4*  --   HGB 14.0 11.5* 10.9* 10.9*  HCT RESULTS UNAVAILABLE DUE TO INTERFERING SUBSTANCE 32.2* 30.6* 32.0*  MCV RESULTS UNAVAILABLE DUE TO INTERFERING SUBSTANCE 84.3 86.0 88.6  PLT 395 383 364 353     Cardiac Enzymes: Recent Labs  Lab 06/02/22 0510  CKTOTAL 111     BNP: Invalid input(s): "POCBNP"  CBG: Recent Labs  Lab 05/31/22 1927  GLUCAP 77  Microbiology: Results for orders placed or performed during the hospital encounter of 05/31/22  Blood Culture (routine x 2)     Status: None (Preliminary result)   Collection Time: 05/31/22  2:58 PM   Specimen: BLOOD  Result Value Ref Range Status   Specimen Description BLOOD RAC  Final   Special Requests   Final    BOTTLES DRAWN AEROBIC AND ANAEROBIC Blood Culture results may not be optimal due to an inadequate volume of blood received in culture bottles   Culture   Final    NO GROWTH 4 DAYS Performed at [facility], [address] [place], [place] [address]    Report Status PENDING  Incomplete  Blood Culture (routine x 2)     Status: None (Preliminary result)   Collection Time: 05/31/22  3:03 PM   Specimen: BLOOD  Result Value Ref Range Status   Specimen Description BLOOD LAC  Final   Special Requests   Final    BOTTLES DRAWN AEROBIC AND ANAEROBIC Blood Culture adequate volume   Culture   Final    NO GROWTH 4 DAYS Performed at [facility], [address], [place], [place] [address]    Report Status PENDING  Incomplete  Resp panel by  RT-PCR (RSV, Flu A&B, Covid) Anterior Nasal Swab     Status: None   Collection Time: 05/31/22  4:28 PM   Specimen: Anterior Nasal Swab  Result Value Ref Range Status   SARS Coronavirus 2 by RT PCR NEGATIVE NEGATIVE Final    Comment: (NOTE) SARS-CoV-2 target nucleic acids are NOT DETECTED.  The SARS-CoV-2 RNA is generally detectable in upper respiratory specimens during the acute phase of infection. The lowest concentration of SARS-CoV-2 viral copies this assay can detect is 138 copies/mL. A negative result does not preclude SARS-Cov-2 infection and should not be used as the sole basis for treatment or other patient management decisions. A negative result may occur with  improper specimen collection/handling, submission of specimen other than nasopharyngeal swab, presence of viral mutation(s) within the areas targeted by this assay, and inadequate number of viral copies(<138 copies/mL). A negative result must be combined with clinical observations, patient history, and epidemiological information. The expected result is Negative.  Fact Sheet for Patients:  EntrepreneurPulse.com.au  Fact Sheet for Healthcare Providers:  IncredibleEmployment.be  This test is no t yet approved or cleared by the [place] FDA and  has been authorized for detection and/or diagnosis of SARS-CoV-2 by FDA under an Emergency Use Authorization (EUA). This EUA will remain  in effect (meaning this test can be used) for the duration of the COVID-19 declaration under Section 564(b)(1) of the Act, 21 U.S.C.section 360bbb-3(b)(1), unless the authorization is terminated  or revoked sooner.       Influenza A by PCR NEGATIVE NEGATIVE Final   Influenza B by PCR NEGATIVE NEGATIVE Final    Comment: (NOTE) The Xpert Xpress SARS-CoV-2/FLU/RSV plus assay is intended as an aid in the diagnosis of influenza from Nasopharyngeal swab specimens and should not be used as a sole basis  for treatment. Nasal washings and aspirates are unacceptable for Xpert Xpress SARS-CoV-2/FLU/RSV testing.  Fact Sheet for Patients: EntrepreneurPulse.com.au  Fact Sheet for Healthcare Providers: IncredibleEmployment.be  This test is not yet approved or cleared by the [place] FDA and has been authorized for detection and/or diagnosis of SARS-CoV-2 by FDA under an Emergency Use Authorization (EUA). This EUA will remain in effect (meaning this test can be used) for the duration of  the COVID-19 declaration under Section 564(b)(1) of the Act, 21 U.S.C. section 360bbb-3(b)(1), unless the authorization is terminated or revoked.     Resp Syncytial Virus by PCR NEGATIVE NEGATIVE Final    Comment: (NOTE) Fact Sheet for Patients: EntrepreneurPulse.com.au  Fact Sheet for Healthcare Providers: IncredibleEmployment.be  This test is not yet approved or cleared by the [place] FDA and has been authorized for detection and/or diagnosis of SARS-CoV-2 by FDA under an Emergency Use Authorization (EUA). This EUA will remain in effect (meaning this test can be used) for the duration of the COVID-19 declaration under Section 564(b)(1) of the Act, 21 U.S.C. section 360bbb-3(b)(1), unless the authorization is terminated or revoked.  Performed at [facility], [address] [place], [place] [address]   MRSA Next Gen by PCR, Nasal     Status: None   Collection Time: 05/31/22  9:11 PM   Specimen: Nasal Mucosa; Nasal Swab  Result Value Ref Range Status   MRSA by PCR Next Gen NOT DETECTED NOT DETECTED Final    Comment: (NOTE) The GeneXpert MRSA Assay (FDA approved for NASAL specimens only), is one component of a comprehensive MRSA colonization surveillance program. It is not intended to diagnose MRSA infection nor to guide or monitor treatment for MRSA infections. Test performance is not FDA approved in  patients less than 32 years old. Performed at [facility], [address] [place],  [address]     Coagulation Studies: No results for input(s): "LABPROT", "INR" in the last 72 hours.   Urinalysis: No results for input(s): "COLORURINE", "LABSPEC", "PHURINE", "GLUCOSEU", "HGBUR", "BILIRUBINUR", "KETONESUR", "PROTEINUR", "UROBILINOGEN", "NITRITE", "LEUKOCYTESUR" in the last 72 hours.  Invalid input(s): "APPERANCEUR"     Imaging: No results found.   Medications:    sodium chloride Stopped (06/01/22 1823)   sodium chloride     [START ON 06/05/2022] sodium chloride     Followed by   Derrill Memo ON 06/05/2022] sodium chloride     cefTRIAXone (ROCEPHIN)  IV Stopped (06/03/22 1714)    [START ON 06/05/2022] aspirin  81 mg Oral Pre-Cath   aspirin EC  81 mg Oral Daily   atorvastatin  80 mg Oral Daily   feeding supplement  237 mL Oral TID BM   guaiFENesin  600 mg Oral BID   heparin  5,000 Units Subcutaneous Q8H   methylPREDNISolone (SOLU-MEDROL) injection  40 mg Intravenous Daily   metoprolol succinate  25 mg Oral Daily   multivitamin with minerals  1 tablet Oral Daily   sodium chloride flush  3 mL Intravenous Q12H   spironolactone  12.5 mg Oral Daily   sodium chloride, sodium chloride, acetaminophen, docusate sodium, guaiFENesin-dextromethorphan, ipratropium-albuterol, ondansetron (ZOFRAN) IV, mouth rinse, polyethylene glycol, sodium chloride flush  Assessment/ Plan:  [name] is a 62 y.o.  female with past medical conditions including hypertension and tobacco use, who was admitted to [facility] on 05/31/2022 for Lactic acidosis [E87.20] Hyponatremia [E87.1] Weakness [R53.1] COPD exacerbation ([place]) [J44.1] NSTEMI (non-ST elevated myocardial infarction) ([place]) [I21.4] Chronic obstructive pulmonary disease, unspecified COPD type ([place]) [J44.9]   Hyponatremia, severe. Symptomatic. Sodium 110 on ED arrival. Likely due to poor oral intake and HCTZ.  Sodium level 133  today.  Continue to encourage oral intake.     LOS: 4 Schneider [place] 2/29/20241:29 PM

## 2022-06-04 NOTE — Progress Notes (Signed)
Pt refuses to wear SCD's, pt educated on purpose, benefits and risks of DVT associated with not wearing them.  Pt gives complete verbal understanding and continues to refuse. Zach RN present during pt refusal and education.

## 2022-06-04 NOTE — Progress Notes (Signed)
Rounding Note    Patient Name: Katie Moody Date of Encounter: 06/04/2022  [place] Cardiologist: None   Subjective   Patient seen on AM rounds. Denies any chest pain, worsening dyspnea, or palpitations. Serum sodium has improved up to 133 this morning. Remains on 1L of O2 via [place]. She is able to lay flat in the bed but she endorses back discomfort with the mattress. No changes were noted with her breathing.  Inpatient Medications    Scheduled Meds:  [START ON 06/05/2022] aspirin  81 mg Oral Pre-Cath   aspirin EC  81 mg Oral Daily   atorvastatin  80 mg Oral Daily   feeding supplement  237 mL Oral TID BM   guaiFENesin  600 mg Oral BID   heparin  5,000 Units Subcutaneous Q8H   methylPREDNISolone (SOLU-MEDROL) injection  40 mg Intravenous Daily   metoprolol succinate  25 mg Oral Daily   multivitamin with minerals  1 tablet Oral Daily   spironolactone  12.5 mg Oral Daily   Continuous Infusions:  sodium chloride Stopped (06/01/22 1823)   sodium chloride     [START ON 06/05/2022] sodium chloride     Followed by   Derrill Memo ON 06/05/2022] sodium chloride     cefTRIAXone (ROCEPHIN)  IV Stopped (06/03/22 1714)   PRN Meds: sodium chloride, sodium chloride, acetaminophen, docusate sodium, guaiFENesin-dextromethorphan, ipratropium-albuterol, ondansetron (ZOFRAN) IV, mouth rinse, polyethylene glycol, sodium chloride flush   Vital Signs    Vitals:   06/04/22 0000 06/04/22 0345 06/04/22 0500 06/04/22 0909  BP: 117/73 118/76  131/81  Pulse: 76 75  84  Resp:    18  Temp: 97.7 F (36.5 C) 97.7 F (36.5 C)  98.2 F (36.8 C)  TempSrc: Oral Oral  Oral  SpO2: 100% 100%  (!) 84%  Weight:   51.6 kg   Height:        Intake/Output Summary (Last 24 hours) at 06/04/2022 1148 Last data filed at 06/04/2022 1044 Gross per 24 hour  Intake 1423.33 ml  Output 850 ml  Net 573.33 ml      06/04/2022    5:00 AM 06/03/2022    3:00 AM 06/02/2022    5:00 AM  Last 3 Weights  Weight  (lbs) 113 lb 12.1 oz 108 lb 7.5 oz 114 lb 13.8 oz  Weight (kg) 51.6 kg 49.2 kg 52.1 kg      Telemetry     Sinus with unifocal PVC's rates 70-80- Personally Reviewed  ECG    No new tracings - Personally Reviewed  Physical Exam   GEN: No acute distress.   Neck: No JVD Cardiac: RRR, no murmurs, rubs, or gallops.  Respiratory: Clear with scattered crackles in the bases to auscultation bilaterally. Respirations remain unlabored at rest on 1L of O2 via  GI: Soft, nontender, non-distended  MS: No edema; No deformity. Neuro:  Alert and oriented, ROM limited to the left upper extremity Psych: Normal affect   Labs    High Sensitivity Troponin:   Recent Labs  Lab 05/31/22 1456 05/31/22 1628 05/31/22 1629  TROPONINIHS 935* 1,582* 1,011*     Chemistry Recent Labs  Lab 05/31/22 1628 05/31/22 1629 06/01/22 0458 06/01/22 0906 06/02/22 0510 06/02/22 1052 06/03/22 0434 06/03/22 1222 06/04/22 0604  NA 110*   < > 117*   < > 124*   < > 126* 127* 133*  K 2.8*   < > 3.4*   < > 3.5   < > 4.1 4.5 5.1  CL <65*   < > 80*   < > 85*   < > 86* 86* 88*  CO2 33*   < > 26   < > 29   < > 33* 33* 38*  GLUCOSE 101*   < > 85   < > 128*   < > 123* 211* 110*  BUN 10   < > 9   < > 7*   < > 6* 7* 11  CREATININE 0.68   < > 0.60   < > 0.39*   < > 0.38* 0.50 0.38*  CALCIUM 8.4*   < > 7.9*   < > 8.0*   < > 8.3* 8.5* 8.9  MG  --   --  1.8  --  2.2  --  2.1  --   --   PROT 6.4*  --  5.6*  --  5.6*  --   --   --   --   ALBUMIN 3.2*  --  2.7*  --  2.7*  --   --   --   --   AST 29  --  25  --  21  --   --   --   --   ALT 14  --  14  --  14  --   --   --   --   ALKPHOS 67  --  60  --  62  --   --   --   --   BILITOT 1.3*  --  1.0  --  0.8  --   --   --   --   GFRNONAA >60   < > >60   < > >60   < > >60 >60 >60  ANIONGAP NOT CALCULATED   < > 11   < > 10   < > '7 8 7   '$ < > = values in this interval not displayed.    Lipids  Recent Labs  Lab 06/02/22 0510  CHOL 98  TRIG 39  HDL 51  LDLCALC 39   CHOLHDL 1.9    Hematology Recent Labs  Lab 06/01/22 0906 06/02/22 0510 06/03/22 0434  WBC 19.7* 22.2* 14.7*  RBC 3.82* 3.56* 3.61*  HGB 11.5* 10.9* 10.9*  HCT 32.2* 30.6* 32.0*  MCV 84.3 86.0 88.6  MCH 30.1 30.6 30.2  MCHC 35.7 35.6 34.1  RDW 11.2* 11.3* 11.5  PLT 383 364 353   Thyroid  Recent Labs  Lab 05/31/22 2109  TSH 0.184*  FREET4 1.23*    BNPNo results for input(s): "BNP", "PROBNP" in the last 168 hours.  DDimer No results for input(s): "DDIMER" in the last 168 hours.   Radiology    No results found.  Cardiac Studies   2D echo 06/01/2022: 1. Left ventricular ejection fraction, by estimation, is 55%. The left  ventricle has normal function. The left ventricle demonstrates regional  wall motion abnormalities (see scoring diagram/findings for description).  Left ventricular diastolic  parameters are consistent with Grade I diastolic dysfunction (impaired  relaxation). There is moderate hypokinesis of the left ventricular,  mid-apical anteroseptal wall.   2. Right ventricular systolic function is normal. The right ventricular  size is normal.   3. The mitral valve is normal in structure. No evidence of mitral valve  regurgitation.   4. The aortic valve is tricuspid. Aortic valve regurgitation is not  visualized.   Patient Profile     62 y.o. female with history of hypertension and tobacco  use who is being seen for the evaluation of elevated troponin.  Assessment & Plan    Elevated high-sensitivity troponin -Patient remains chest pain-free since admission -Possibly supply/demand ischemia in the setting of acute presentation with severe sepsis with community-acquired pneumonia, acute hypercapnic respiratory failure, and multiple electrolyte and laboratory derangements versus ACS -Echocardiogram with preserved LV systolic function with moderate hypokinesis of the left ventricle mid apical and anterior septal wall -She did complete 48 hours of heparin -The  addition of beta-blockers or ACE inhibitors was deferred given periodic hypotension -LDL 39 continued on statin therapy with atorvastatin -Continued on aspirin 81 mg daily -With improvement in respiratory status and electrolyte derangements she is scheduled for left heart catheterization with Dr. Ellyn Hack this afternoon recommendations to follow -She will remain n.p.o. after breakfast  Community-acquired pneumonia -Multifocal pneumonia seen on CT scan -Continued Rocephin and Zithromax for 5 days -Continues to require the use of low-dose oxygen -Continue to titrate FiO2 to maintain sats equal equal to greater than 92% Management per IM  Hyponatremia severe on arrival likely related to poor oral intake and HCTZ -Serum sodium 110 on arrival -Serum sodium this a.m. 133 -Daily BMP -Continue to hold free water -Nephrology continues to follow  Hypertension -Blood pressure 131/81 -She has been continued on Toprol-XL 25 mg daily and spironolactone 12.5 mg daily -Vital signs per unit protocol  Shared Decision Making/Informed Consent The risks [stroke (1 in 1000), death (1 in 1000), kidney failure [usually temporary] (1 in 500), bleeding (1 in 200), allergic reaction [possibly serious] (1 in 200)], benefits (diagnostic support and management of coronary artery disease) and alternatives of a cardiac catheterization were discussed in detail with Ms. Sether and she is willing to proceed.   For questions or updates, please contact [place] Please consult [email or web address] for contact info under        Signed, Daysi Boggan, NP  06/04/2022, 11:48 AM

## 2022-06-04 NOTE — Interval H&P Note (Signed)
History and Physical Interval Note:  06/04/2022 3:19 PM  Katie Moody  has presented today for surgery, with the diagnosis of left heart catheterization with possible percutaneous coronary intervention.  The various methods of treatment have been discussed with the patient and family. After consideration of risks, benefits and other options for treatment, the patient has consented to  Procedure(s): LEFT HEART CATH AND CORONARY ANGIOGRAPHY (N/A)  PERCUTANEOUS CORONARY INTERVENTION  as a surgical intervention.  The patient's history has been reviewed, patient examined, no change in status, stable for surgery.  I have reviewed the patient's chart and labs.  Questions were answered to the patient's satisfaction.    Cath Lab Visit (complete for each Cath Lab visit)  Clinical Evaluation Leading to the Procedure:   ACS: Yes.    Non-ACS:    Anginal Classification: No Symptoms -elevated troponin in the setting of pneumonia/sepsis  Anti-ischemic medical therapy: Minimal Therapy (1 class of medications)  Non-Invasive Test Results: Intermediate-risk stress test findings: cardiac mortality 1-3%/year -_> elevated troponin with new RWMA on Echo.    Glenetta Hew

## 2022-06-04 NOTE — TOC Progression Note (Signed)
Transition of Care [facility]) - Progression Note    Patient Details  Name: Katie Moody MRN: [id number] Date of Birth: May 20, 1960  [facility]) CM/SW Contact  Laurena Slimmer, RN Phone Number: 06/04/2022, 10:55 AM  Clinical Narrative:    Case reviewed for DME needs and changes in discharge disposition.         Expected Discharge Plan and Services                                               Social Determinants of Health (SDOH) Interventions SDOH Screenings   Food Insecurity: No Food Insecurity (02/09/2022)  Transportation Needs: No Transportation Needs (02/09/2022)  Tobacco Use: Low Risk  (05/31/2022)    Readmission Risk Interventions     No data to display

## 2022-06-04 NOTE — TOC Progression Note (Signed)
Transition of Care [facility]) - Progression Note    Patient Details  Name: Katie Moody MRN: [id number] Date of Birth: [birth year]  Transition of Care [facility]) CM/SW Contact  Laurena Slimmer, RN Phone Number: 06/04/2022, 11:05 AM  Clinical Narrative:    Patient is uninsured. [employer] scheduled for [facility] charity rotation. Referral sent to [facility] at [facility].          Expected Discharge Plan and Services                                               Social Determinants of Health (SDOH) Interventions SDOH Screenings   Food Insecurity: No Food Insecurity (02/09/2022)  Transportation Needs: No Transportation Needs (02/09/2022)  Tobacco Use: Low Risk  (05/31/2022)    Readmission Risk Interventions     No data to display

## 2022-06-04 NOTE — Progress Notes (Signed)
Progress Note   Patient: Katie Moody [id number] DOB: 05-02-1960 DOA: 05/31/2022     4 DOS: the patient was seen and examined on 06/04/2022   Brief hospital course: 62 year old F with PMH of HTN and tobacco use disorder presenting with shortness of breath and productive cough for 5 days and generalized weakness for 2 days, dizzy nests and fall without trauma or loss of consciousness, and admitted to ICU with severe hyponatremia, hypokalemia, bibasilar pneumonia with mucous plugging and non-STEMI.   In ED, vital stable except for mild tachycardia. Na 110. K 2.8.  Lactic acid 3.0.  CXR without focal airspace opacity.  CTA chest negative for PE but opacification of the bilateral lower lobe posterior and lateral segmental airways concerning for mucous plugging.  CTA chest also showed scattered centrilobular and tree-in-bud airspace opacities within the bilateral lower lobes, medial segment of RML and anteromedial LUL.  Patient was started on IV NS fluid, IV Solu-Medrol, DuoNeb, antibiotic for CAP coverage and IV heparin for non-STEMI, and admitted to ICU.  Nephrology consulted for hyponatremia.    Patient's care transferred to [facility] hospitalist service on 2/26.  Code stroke activated the morning of 2/26 due to LUE weakness.  CT head and MRI without acute finding.  MRI cervical spine with some foraminal stenosis.  Assessment and Plan: * Severe sepsis ([place]) Present on admission.  On Rocephin and Zithromax for 5 days.  Patient also on Solu-Medrol.  CAP (community acquired pneumonia) Multifocal pneumonia seen on CT scan.  Hyponatremia Secondary to hydrochlorothiazide.  Last sodium 127.  Myocardial ischemia Unclear if this is NSTEMI or not.  Patient completed 48 hours of heparin.  Continue aspirin.  Cardiology will do cardiac catheterization prior to disposition.  Left arm weakness MRI of the brain negative for stroke.  Continue PT and OT evaluations.  Will need an outpatient  EMG.  Malnutrition of moderate degree Continue supplements.  Underweight with a BMI of 16.49 with current height and weight in computer.  Acute respiratory failure with hypercapnia (HCC) pCO2 62 on blood gas.  Will get pulse ox on room air.        Subjective: Patient feels okay.  Still has some left-sided weakness.  No pain in her neck.  Cardiac catheterization today.  Sodium is improved.  Physical Exam: Vitals:   06/04/22 0000 06/04/22 0345 06/04/22 0500 06/04/22 0909  BP: 117/73 118/76  131/81  Pulse: 76 75  84  Resp:    18  Temp: 97.7 F (36.5 C) 97.7 F (36.5 C)  98.2 F (36.8 C)  TempSrc: Oral Oral  Oral  SpO2: 100% 100%  (!) 84%  Weight:   51.6 kg   Height:       Physical Exam HENT:     Head: Normocephalic.     Mouth/Throat:     Pharynx: No oropharyngeal exudate.  Eyes:     General: Lids are normal.     Conjunctiva/sclera: Conjunctivae normal.  Cardiovascular:     Rate and Rhythm: Normal rate and regular rhythm.     Heart sounds: Normal heart sounds, S1 normal and S2 normal.  Pulmonary:     Breath sounds: Examination of the right-lower field reveals decreased breath sounds. Examination of the left-lower field reveals decreased breath sounds. Decreased breath sounds present. No wheezing, rhonchi or rales.  Abdominal:     Palpations: Abdomen is soft.     Tenderness: There is no abdominal tenderness.  Musculoskeletal:     Right lower leg: No swelling.  Left lower leg: No swelling.  Skin:    General: Skin is warm.     Findings: No rash.  Neurological:     Mental Status: She is alert and oriented to person, place, and time.     Comments: Left arm weakness.  Unable to lift arm overhead.  Able to have grip strength 4 out of 5.  Able to flex at the elbow but unable to extend very well.     Data Reviewed: Sodium 133, creatinine is 0.38  Family Communication: Spoke with patient's brother yesterday.  Disposition: Status is: Inpatient Remains inpatient  appropriate because: Cardiac catheterization today  Planned Discharge Destination: Home with Home Health physical therapy recommending rehab but without a payer source will likely have to go home with home health.    Time spent: 27 minutes  Author: Loletha Grayer, MD 06/04/2022 12:16 PM  For on call review [email or web address].

## 2022-06-05 ENCOUNTER — Encounter: Payer: Self-pay | Admitting: Cardiology

## 2022-06-05 DIAGNOSIS — R531 Weakness: Secondary | ICD-10-CM

## 2022-06-05 DIAGNOSIS — I5181 Takotsubo syndrome: Secondary | ICD-10-CM

## 2022-06-05 DIAGNOSIS — J9601 Acute respiratory failure with hypoxia: Secondary | ICD-10-CM

## 2022-06-05 LAB — CBC
HCT: 33.3 % — ABNORMAL LOW (ref 36.0–46.0)
Hemoglobin: 11.1 g/dL — ABNORMAL LOW (ref 12.0–15.0)
MCH: 30.5 pg (ref 26.0–34.0)
MCHC: 33.3 g/dL (ref 30.0–36.0)
MCV: 91.5 fL (ref 80.0–100.0)
Platelets: 317 10*3/uL (ref 150–400)
RBC: 3.64 MIL/uL — ABNORMAL LOW (ref 3.87–5.11)
RDW: 11.4 % — ABNORMAL LOW (ref 11.5–15.5)
WBC: 11.9 10*3/uL — ABNORMAL HIGH (ref 4.0–10.5)
nRBC: 0 % (ref 0.0–0.2)

## 2022-06-05 LAB — BASIC METABOLIC PANEL
Anion gap: 6 (ref 5–15)
BUN: 10 mg/dL (ref 8–23)
CO2: 37 mmol/L — ABNORMAL HIGH (ref 22–32)
Calcium: 8.2 mg/dL — ABNORMAL LOW (ref 8.9–10.3)
Chloride: 89 mmol/L — ABNORMAL LOW (ref 98–111)
Creatinine, Ser: 0.51 mg/dL (ref 0.44–1.00)
GFR, Estimated: >60 mL/min (ref >60)
Glucose, Bld: 89 mg/dL (ref 70–99)
Potassium: 4.7 mmol/L (ref 3.5–5.1)
Sodium: 132 mmol/L — ABNORMAL LOW (ref 135–145)

## 2022-06-05 LAB — CULTURE, BLOOD (ROUTINE X 2)
Culture: NO GROWTH
Culture: NO GROWTH
Special Requests: ADEQUATE

## 2022-06-05 MED ORDER — ATORVASTATIN CALCIUM 20 MG PO TABS
40.0000 mg | ORAL_TABLET | Freq: Every day | ORAL | Status: DC
  Administered 2022-06-05 – 2022-06-08 (×4): 40 mg via ORAL
  Filled 2022-06-05 (×4): qty 2

## 2022-06-05 MED ORDER — LOSARTAN POTASSIUM 25 MG PO TABS
12.5000 mg | ORAL_TABLET | Freq: Every day | ORAL | Status: DC
  Administered 2022-06-05 – 2022-06-08 (×4): 12.5 mg via ORAL
  Filled 2022-06-05 (×4): qty 1

## 2022-06-05 NOTE — Progress Notes (Signed)
Progress Note   Patient: Katie Moody [id number] DOB: 29-Sep-1960 DOA: 05/31/2022     5 DOS: the patient was seen and examined on 06/05/2022   Brief hospital course: 62 year old F with PMH of HTN and tobacco use disorder presenting with shortness of breath and productive cough for 5 days and generalized weakness for 2 days, dizzy nests and fall without trauma or loss of consciousness, and admitted to ICU with severe hyponatremia, hypokalemia, bibasilar pneumonia with mucous plugging and non-STEMI.   In ED, vital stable except for mild tachycardia. Na 110. K 2.8.  Lactic acid 3.0.  CXR without focal airspace opacity.  CTA chest negative for PE but opacification of the bilateral lower lobe posterior and lateral segmental airways concerning for mucous plugging.  CTA chest also showed scattered centrilobular and tree-in-bud airspace opacities within the bilateral lower lobes, medial segment of RML and anteromedial LUL.  Patient was started on IV NS fluid, IV Solu-Medrol, DuoNeb, antibiotic for CAP coverage and IV heparin for non-STEMI, and admitted to ICU.  Nephrology consulted for hyponatremia.    Patient's care transferred to [facility] hospitalist service on 2/26.  Code stroke activated the morning of 2/26 due to LUE weakness.  CT head and MRI without acute finding.  MRI cervical spine with some foraminal stenosis.  2/29.  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  Sodium up to 133. 3/1.  Sodium 132.  Patient only walked 8 feet with physical therapy.  Since patient does not have insurance, will have to go home with home health at some point.  Also unable to come off oxygen today.  Assessment and Plan: * Severe sepsis ([place]) Present on admission.  Completed Rocephin, Zithromax and Solu-Medrol.  CAP (community acquired pneumonia) Multifocal pneumonia seen on CT scan.  Hyponatremia Secondary to hydrochlorothiazide.  Last sodium 132.  Myocardial  ischemia Unclear if this is NSTEMI or not.  Patient completed 48 hours of heparin.  Continue aspirin.  Cardiology will do cardiac catheterization today  Generalized weakness Patient only able to walk 8 feet today.  Without insurance, unable to go to rehab.  Will likely have to discharge home with home health.  Left arm weakness MRI of the brain negative for stroke.  Continue PT and OT evaluations.  Will need an outpatient EMG as per neurology  Malnutrition of moderate degree Continue supplements.  Underweight with a BMI of 17.36 with current height and weight in computer.  Acute respiratory failure with hypoxia and hypercapnia (HCC) Pulse ox dropped down to 85% on room air today.  pCO2 62 on presentation.  Continue to see if we can taper off oxygen while here.        Subjective: Patient feels okay today.  Eating better.  Occasional shortness of breath.  Admitted initially with severe sepsis and pneumonia.  Physical Exam: Vitals:   06/04/22 2330 06/05/22 0311 06/05/22 0839 06/05/22 1312  BP: 111/77 101/75 115/69   Pulse: 65 69 83   Resp: '18 18 16   '$ Temp: (!) 97.5 F (36.4 C) (!) 97.5 F (36.4 C) 98 F (36.7 C)   TempSrc: Oral Oral Oral   SpO2: 100% 100% 98%   Weight:    51.8 kg  Height:       Physical Exam HENT:     Head: Normocephalic.     Mouth/Throat:     Pharynx: No oropharyngeal exudate.  Eyes:     General: Lids are normal.     Conjunctiva/sclera: Conjunctivae normal.  Cardiovascular:  Rate and Rhythm: Normal rate and regular rhythm.     Heart sounds: Normal heart sounds, S1 normal and S2 normal.  Pulmonary:     Breath sounds: Examination of the right-lower field reveals decreased breath sounds. Examination of the left-lower field reveals decreased breath sounds. Decreased breath sounds present. No wheezing, rhonchi or rales.  Abdominal:     Palpations: Abdomen is soft.     Tenderness: There is no abdominal tenderness.  Musculoskeletal:     Right lower  leg: No swelling.     Left lower leg: No swelling.  Skin:    General: Skin is warm.     Findings: No rash.  Neurological:     Mental Status: She is alert and oriented to person, place, and time.     Comments: Left arm weakness.  Unable to lift arm overhead.  Able to have grip strength 4 out of 5.  Able to flex at the elbow but unable to extend very well.     Data Reviewed: Creatinine 0.51, hemoglobin 11.1, white blood cell count 11.9  Family Communication: Updated patient's sister on the phone.  She states she works 5 days a week and is not there all the time  Disposition: Status is: Inpatient Remains inpatient appropriate because: Unfortunately unable to send out to rehab because she does not have insurance.  Patient's sister works 5 days a week.  Unfortunately only walked 8 feet today.  Will see if we can try to get her stronger before disposition.  Planned Discharge Destination: Home with Home Health    Time spent: 28 minutes  Author: Loletha Grayer, MD 06/05/2022 2:58 PM  For on call review [email or web address].

## 2022-06-05 NOTE — Progress Notes (Signed)
SATURATION QUALIFICATIONS: (This note is used to comply with regulatory documentation for home oxygen)  Patient Saturations on Room Air at Rest = 86%  Patient Saturations on Room Air while Ambulating = n/a%  Patient Saturations on 2 Liters of oxygen while Ambulating = 92 %  Please briefly explain why patient needs home oxygen:

## 2022-06-05 NOTE — Progress Notes (Signed)
[facility]  ROUNDING NOTE   Subjective:   Patient seen sitting up in bed, awaiting breakfast States she feels well today Remains on 2 L nasal cannula, room air at baseline Denies pain or discomfort Continues to have weakness  LHC on 06/04/2022, without intervention.  Sodium 132  Objective:  Vital signs in last 24 hours:  Temp:  [97.5 F (36.4 C)-98.2 F (36.8 C)] 98 F (36.7 C) (03/01 0839) Pulse Rate:  [65-93] 83 (03/01 0839) Resp:  [14-20] 16 (03/01 0839) BP: (99-150)/(67-92) 115/69 (03/01 0839) SpO2:  [82 %-100 %] 98 % (03/01 0839)  Weight change:  Filed Weights   06/02/22 0500 06/03/22 0300 06/04/22 0500  Weight: 52.1 kg 49.2 kg 51.6 kg    Intake/Output: I/O last 3 completed shifts: In: 1265.1 [P.O.:600; I.V.:465.1; IV Piggyback:200] Out: 1550 [Urine:1550]   Intake/Output this shift:  Total I/O In: 240 [P.O.:240] Out: -   Physical Exam: General: NAD  Head: Normocephalic, atraumatic. Moist oral mucosal membranes  Eyes: Anicteric  Lungs:  Clear to auscultation, normal effort  Heart: Regular rate and rhythm  Abdomen:  Soft, nontender  Extremities:  No peripheral edema.  Neurologic: Alert and oriented, moving all four extremities  Skin: No lesions  Access: None    Basic Metabolic Panel: Recent Labs  Lab 06/01/22 0458 06/01/22 0906 06/02/22 0510 06/02/22 1052 06/02/22 2258 06/03/22 0434 06/03/22 1222 06/04/22 0604 06/05/22 0802  NA 117*   < > 124*   < > 125* 126* 127* 133* 132*  K 3.4*   < > 3.5   < > 4.2 4.1 4.5 5.1 4.7  CL 80*   < > 85*   < > 84* 86* 86* 88* 89*  CO2 26   < > 29   < > 35* 33* 33* 38* 37*  GLUCOSE 85   < > 128*   < > 159* 123* 211* 110* 89  BUN 9   < > 7*   < > 7* 6* 7* 11 10  CREATININE 0.60   < > 0.39*   < > 0.42* 0.38* 0.50 0.38* 0.51  CALCIUM 7.9*   < > 8.0*   < > 8.1* 8.3* 8.5* 8.9 8.2*  MG 1.8  --  2.2  --   --  2.1  --   --   --   PHOS 3.2  --  1.7*  --   --  2.2*  --  3.1  --    < > = values in this  interval not displayed.     Liver Function Tests: Recent Labs  Lab 05/31/22 1628 06/01/22 0458 06/02/22 0510  AST '29 25 21  '$ ALT '14 14 14  '$ ALKPHOS 67 60 62  BILITOT 1.3* 1.0 0.8  PROT 6.4* 5.6* 5.6*  ALBUMIN 3.2* 2.7* 2.7*    Recent Labs  Lab 05/31/22 2109  LIPASE 59*    No results for input(s): "AMMONIA" in the last 168 hours.  CBC: Recent Labs  Lab 06/01/22 0906 06/02/22 0510 06/03/22 0434 06/04/22 0600 06/05/22 0802  WBC 19.7* 22.2* 14.7* 14.6* 11.9*  NEUTROABS 17.9* 19.4*  --   --   --   HGB 11.5* 10.9* 10.9* 11.0* 11.1*  HCT 32.2* 30.6* 32.0* 34.2* 33.3*  MCV 84.3 86.0 88.6 94.0 91.5  PLT 383 364 353 347 317     Cardiac Enzymes: Recent Labs  Lab 06/02/22 0510  CKTOTAL 111     BNP: Invalid input(s): "POCBNP"  CBG: Recent Labs  Lab 05/31/22  1927  GLUCAP 70     Microbiology: Results for orders placed or performed during the hospital encounter of 05/31/22  Blood Culture (routine x 2)     Status: None   Collection Time: 05/31/22  2:58 PM   Specimen: BLOOD  Result Value Ref Range Status   Specimen Description BLOOD RAC  Final   Special Requests   Final    BOTTLES DRAWN AEROBIC AND ANAEROBIC Blood Culture results may not be optimal due to an inadequate volume of blood received in culture bottles   Culture   Final    NO GROWTH 5 DAYS Performed at [facility], [address], [place], [place] [address]    Report Status 06/05/2022 FINAL  Final  Blood Culture (routine x 2)     Status: None   Collection Time: 05/31/22  3:03 PM   Specimen: BLOOD  Result Value Ref Range Status   Specimen Description BLOOD LAC  Final   Special Requests   Final    BOTTLES DRAWN AEROBIC AND ANAEROBIC Blood Culture adequate volume   Culture   Final    NO GROWTH 5 DAYS Performed at [facility], [address] [place], [place] [address]    Report Status 06/05/2022 FINAL  Final  Resp panel by RT-PCR (RSV, Flu A&B, Covid) Anterior Nasal Swab      Status: None   Collection Time: 05/31/22  4:28 PM   Specimen: Anterior Nasal Swab  Result Value Ref Range Status   SARS Coronavirus 2 by RT PCR NEGATIVE NEGATIVE Final    Comment: (NOTE) SARS-CoV-2 target nucleic acids are NOT DETECTED.  The SARS-CoV-2 RNA is generally detectable in upper respiratory specimens during the acute phase of infection. The lowest concentration of SARS-CoV-2 viral copies this assay can detect is 138 copies/mL. A negative result does not preclude SARS-Cov-2 infection and should not be used as the sole basis for treatment or other patient management decisions. A negative result may occur with  improper specimen collection/handling, submission of specimen other than nasopharyngeal swab, presence of viral mutation(s) within the areas targeted by this assay, and inadequate number of viral copies(<138 copies/mL). A negative result must be combined with clinical observations, patient history, and epidemiological information. The expected result is Negative.  Fact Sheet for Patients:  EntrepreneurPulse.com.au  Fact Sheet for Healthcare Providers:  IncredibleEmployment.be  This test is no t yet approved or cleared by the [place] FDA and  has been authorized for detection and/or diagnosis of SARS-CoV-2 by FDA under an Emergency Use Authorization (EUA). This EUA will remain  in effect (meaning this test can be used) for the duration of the COVID-19 declaration under Section 564(b)(1) of the Act, 21 U.S.C.section 360bbb-3(b)(1), unless the authorization is terminated  or revoked sooner.       Influenza A by PCR NEGATIVE NEGATIVE Final   Influenza B by PCR NEGATIVE NEGATIVE Final    Comment: (NOTE) The Xpert Xpress SARS-CoV-2/FLU/RSV plus assay is intended as an aid in the diagnosis of influenza from Nasopharyngeal swab specimens and should not be used as a sole basis for treatment. Nasal washings and aspirates are  unacceptable for Xpert Xpress SARS-CoV-2/FLU/RSV testing.  Fact Sheet for Patients: EntrepreneurPulse.com.au  Fact Sheet for Healthcare Providers: IncredibleEmployment.be  This test is not yet approved or cleared by the [place] FDA and has been authorized for detection and/or diagnosis of SARS-CoV-2 by FDA under an Emergency Use Authorization (EUA). This EUA will remain in effect (meaning this test can  be used) for the duration of the COVID-19 declaration under Section 564(b)(1) of the Act, 21 U.S.C. section 360bbb-3(b)(1), unless the authorization is terminated or revoked.     Resp Syncytial Virus by PCR NEGATIVE NEGATIVE Final    Comment: (NOTE) Fact Sheet for Patients: EntrepreneurPulse.com.au  Fact Sheet for Healthcare Providers: IncredibleEmployment.be  This test is not yet approved or cleared by the [place] FDA and has been authorized for detection and/or diagnosis of SARS-CoV-2 by FDA under an Emergency Use Authorization (EUA). This EUA will remain in effect (meaning this test can be used) for the duration of the COVID-19 declaration under Section 564(b)(1) of the Act, 21 U.S.C. section 360bbb-3(b)(1), unless the authorization is terminated or revoked.  Performed at [facility], [address] [place], [place] [address]   MRSA Next Gen by PCR, Nasal     Status: None   Collection Time: 05/31/22  9:11 PM   Specimen: Nasal Mucosa; Nasal Swab  Result Value Ref Range Status   MRSA by PCR Next Gen NOT DETECTED NOT DETECTED Final    Comment: (NOTE) The GeneXpert MRSA Assay (FDA approved for NASAL specimens only), is one component of a comprehensive MRSA colonization surveillance program. It is not intended to diagnose MRSA infection nor to guide or monitor treatment for MRSA infections. Test performance is not FDA approved in patients less than 55 years old. Performed at  [facility], [address] [place], [place] [address]     Coagulation Studies: No results for input(s): "LABPROT", "INR" in the last 72 hours.   Urinalysis: No results for input(s): "COLORURINE", "LABSPEC", "PHURINE", "GLUCOSEU", "HGBUR", "BILIRUBINUR", "KETONESUR", "PROTEINUR", "UROBILINOGEN", "NITRITE", "LEUKOCYTESUR" in the last 72 hours.  Invalid input(s): "APPERANCEUR"     Imaging: CARDIAC CATHETERIZATION  Result Date: 06/04/2022   Small, non-dominant RCA - Prox RCA lesion is 50% stenosed.   There is no aortic valve stenosis.   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 No LAD lesion to explain LAD regional wall motion abnormality. 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 and Not ACS RECOMMENDATIONS Continue supportive care Glenetta Hew, MD    Medications:    sodium chloride Stopped (06/01/22 1823)   sodium chloride      aspirin EC  81 mg Oral Daily   atorvastatin  40 mg Oral Daily   feeding supplement  237 mL Oral TID BM   guaiFENesin  600 mg Oral BID   heparin  5,000 Units Subcutaneous Q8H   losartan  12.5 mg Oral Daily   methylPREDNISolone (SOLU-MEDROL) injection  40 mg Intravenous Daily   metoprolol succinate  25 mg Oral Daily   multivitamin with minerals  1 tablet Oral Daily   sodium chloride flush  3 mL Intravenous Q12H   sodium chloride flush  3 mL Intravenous Q12H   sodium chloride, sodium chloride, acetaminophen, docusate sodium, guaiFENesin-dextromethorphan, ipratropium-albuterol, ondansetron (ZOFRAN) IV, mouth rinse, polyethylene glycol, sodium chloride flush  Assessment/ Plan:  Katie Moody is a 62 y.o.  female with past medical conditions including hypertension and tobacco use, who was admitted to [facility] on 05/31/2022 for Lactic acidosis [E87.20] Hyponatremia [E87.1] Weakness [R53.1] COPD exacerbation ([place]) [J44.1] NSTEMI (non-ST elevated myocardial  infarction) ([place]) [I21.4] Chronic obstructive pulmonary disease, unspecified COPD type ([place]) [J44.9]   Hyponatremia, severe. Symptomatic. Sodium 110 on ED arrival. Likely due to poor oral intake and HCTZ.  Sodium remained stable at 132 Continue to encourage oral intake.  Will monitor peripherally throughout weekend.  LOS: 5 Tito Ausmus 3/1/202412:42 PM

## 2022-06-05 NOTE — TOC Progression Note (Signed)
Transition of Care [facility]) - Progression Note    Patient Details  Name: Katie Moody MRN: [id number] Date of Birth: 26-Oct-1960  Transition of [facility]) CM/SW Contact  Laurena Slimmer, RN Phone Number: 06/05/2022, 1:02 PM  Clinical Narrative:    [facility] referral accepted by Merleen Nicely.  Patient will require oxygen at discharge         Expected Discharge Plan and Services                                               Social Determinants of Health (SDOH) Interventions SDOH Screenings   Food Insecurity: No Food Insecurity (02/09/2022)  Transportation Needs: No Transportation Needs (02/09/2022)  Tobacco Use: Low Risk  (06/05/2022)    Readmission Risk Interventions     No data to display

## 2022-06-05 NOTE — Plan of Care (Signed)

## 2022-06-05 NOTE — Assessment & Plan Note (Addendum)
Patient only able to walk 8 feet yesterday.  Without insurance, unable to go to rehab.  Will likely have to discharge home with home health.

## 2022-06-05 NOTE — Progress Notes (Signed)
Physical Therapy Treatment Patient Details Name: Katie Moody MRN: [id number] DOB: 05-17-1960 Today's Date: 06/05/2022   History of Present Illness Pt is a 62 year old F with PMH of HTN and tobacco use disorder presenting with shortness of breath and productive cough for 5 days and generalized weakness for 2 days, dizzy and fall without trauma or loss of consciousness. MD assessment includes: Acute respiratory failure with hypercapnia, severe hyponatremia, NSTEMI, hypokalemia, noted LUE weakness with MRI negative for stroke, and lactic acidosis.    PT Comments    Pt continued to require encouragement and education on benefits of activity/mobility to participate but put forth fair effort during the session.  Pt's SpO2 on 1L at rest was 94%, dropped to 85-86% at rest on room air so she was returned to 1L.  Pt's SpO2 stayed >/= 88% with activity/ambulation on 1L during the remainder of the session and returned to the low 90s at rest on 1L .  Pt required physical assist with all functional tasks and was only able to amb around 8 feet or so with min A for stability.  +2 HHA was utilized on either side of her with support on her R elbow only to avoid use of her R wrist secondary to cardiac cath procedure on 2/29. Radial procedural site monitored throughout the session with no concerns noted. Pt will benefit from PT services in a SNF setting upon discharge to safely address deficits listed in patient problem list for decreased caregiver assistance and eventual return to PLOF.    Patient suffers from generalized weakness and acute respiratory failure which impairs her ability to perform daily activities like toileting, feeding, dressing, grooming, bathing in the home. A cane, walker, crutch will not resolve the patient's issue with performing activities of daily living. A lightweight wheelchair and cushion is required/recommended and will allow patient to safely perform daily activities.Patient has a caregiver  who can provide assistance.       Recommendations for follow up therapy are one component of a multi-disciplinary discharge planning process, led by the attending physician.  Recommendations may be updated based on patient status, additional functional criteria and insurance authorization.  Follow Up Recommendations  Skilled nursing-short term rehab (<3 hours/day) Can patient physically be transported by private vehicle: Yes   Assistance Recommended at Discharge Frequent or constant Supervision/Assistance  Patient can return home with the following A lot of help with walking and/or transfers;A lot of help with bathing/dressing/bathroom;Assistance with cooking/housework;Assist for transportation;Help with stairs or ramp for entrance   Equipment Recommendations  Wheelchair (measurements PT)    Recommendations for Other Services       Precautions / Restrictions Precautions Precautions: Fall Restrictions Weight Bearing Restrictions: No     Mobility  Bed Mobility Overal bed mobility: Needs Assistance Bed Mobility: Supine to Sit, Sit to Supine     Supine to sit: Mod assist Sit to supine: Mod assist   General bed mobility comments: Mod A for BLE and trunk control    Transfers Overall transfer level: Needs assistance Equipment used: 2 person hand held assist (+2 support provided to LUE and under R elbow to avoid R wrist secondary to cardiac cath procedure) Transfers: Sit to/from Stand Sit to Stand: Min assist, +2 physical assistance           General transfer comment: Min A to come to full upright position with cues for hand placement to avoid use of RUE    Ambulation/Gait Ambulation/Gait assistance: Min assist, +2 physical assistance Gait Distance (Feet):  8 Feet Assistive device: 2 person hand held assist (Support provided to LUE and under R elbow only on the RUE) Gait Pattern/deviations: Step-to pattern, Trunk flexed, Decreased step length - left, Decreased step  length - right Gait velocity: decreased     General Gait Details: Min A to prevent LOB during amb with pt only able to amb a max of 8 feet before needing to return to sitting.   Stairs             Wheelchair Mobility    Modified Rankin (Stroke Patients Only)       Balance Overall balance assessment: Needs assistance Sitting-balance support: Feet supported Sitting balance-Leahy Scale: Good     Standing balance support: During functional activity, Bilateral upper extremity supported Standing balance-Leahy Scale: Poor                              Cognition Arousal/Alertness: Awake/alert Behavior During Therapy: WFL for tasks assessed/performed Overall Cognitive Status: Within Functional Limits for tasks assessed                                          Exercises Total Joint Exercises Ankle Circles/Pumps: Strengthening, Both, 10 reps, 5 reps (with manual resistance) Quad Sets: Strengthening, Both, 10 reps Gluteal Sets: Strengthening, Both, 10 reps Heel Slides: Strengthening, Both, 5 reps Hip ABduction/ADduction: AROM, Strengthening, Both, 5 reps, AAROM Straight Leg Raises: AROM, Strengthening, Both, 5 reps, AAROM Long Arc Quad: AROM, Strengthening, Both, 10 reps, 5 reps Knee Flexion: AROM, Strengthening, Both, 10 reps, 5 reps Other Exercises Other Exercises: HEP education for BLE APs, QS, and GS x 10 each every 1-2 hours daily Other Exercises: Pt education provided on physiological benefits of activity    General Comments        Pertinent Vitals/Pain Pain Assessment Pain Assessment: No/denies pain    Home Living                          Prior Function            PT Goals (current goals can now be found in the care plan section) Progress towards PT goals: Progressing toward goals    Frequency    Min 2X/week      PT Plan Current plan remains appropriate    Co-evaluation              AM-PAC PT "6  Clicks" Mobility   Outcome Measure  Help needed turning from your back to your side while in a flat bed without using bedrails?: A Little Help needed moving from lying on your back to sitting on the side of a flat bed without using bedrails?: A Little Help needed moving to and from a bed to a chair (including a wheelchair)?: A Little Help needed standing up from a chair using your arms (e.g., wheelchair or bedside chair)?: A Little Help needed to walk in hospital room?: A Lot Help needed climbing 3-5 steps with a railing? : Total 6 Click Score: 15    End of Session Equipment Utilized During Treatment: Gait belt;Oxygen Activity Tolerance: Patient tolerated treatment well Patient left: in bed;with call bell/phone within reach;with bed alarm set Nurse Communication: Mobility status PT Visit Diagnosis: Unsteadiness on feet (R26.81);Difficulty in walking, not elsewhere classified (R26.2);Muscle weakness (generalized) (M62.81)  Time: 1104-1130 PT Time Calculation (min) (ACUTE ONLY): 26 min  Charges:  $Therapeutic Exercise: 8-22 mins $Therapeutic Activity: 8-22 mins                    D. Scott Gaylen Venning PT, DPT 06/05/22, 11:59 AM

## 2022-06-05 NOTE — Progress Notes (Signed)
Rounding Note    Patient Name: Katie Moody Date of Encounter: 06/05/2022  [place] Cardiologist: None   Subjective   Patient seen on a.m. rounds.  Denies any chest pain, shortness of breath or palpitations.  Denies any pain or discomfort to the right radial cath site.  She remains on 1 L of O2 via nasal cannula.  Inpatient Medications    Scheduled Meds:  aspirin EC  81 mg Oral Daily   atorvastatin  40 mg Oral Daily   feeding supplement  237 mL Oral TID BM   guaiFENesin  600 mg Oral BID   heparin  5,000 Units Subcutaneous Q8H   losartan  12.5 mg Oral Daily   methylPREDNISolone (SOLU-MEDROL) injection  40 mg Intravenous Daily   metoprolol succinate  25 mg Oral Daily   multivitamin with minerals  1 tablet Oral Daily   sodium chloride flush  3 mL Intravenous Q12H   sodium chloride flush  3 mL Intravenous Q12H   Continuous Infusions:  sodium chloride Stopped (06/01/22 1823)   sodium chloride     PRN Meds: sodium chloride, sodium chloride, acetaminophen, docusate sodium, guaiFENesin-dextromethorphan, ipratropium-albuterol, ondansetron (ZOFRAN) IV, mouth rinse, polyethylene glycol, sodium chloride flush   Vital Signs    Vitals:   06/04/22 2330 06/05/22 0311 06/05/22 0839 06/05/22 1312  BP: 111/77 101/75 115/69   Pulse: 65 69 83   Resp: '18 18 16   '$ Temp: (!) 97.5 F (36.4 C) (!) 97.5 F (36.4 C) 98 F (36.7 C)   TempSrc: Oral Oral Oral   SpO2: 100% 100% 98%   Weight:    51.8 kg  Height:        Intake/Output Summary (Last 24 hours) at 06/05/2022 1319 Last data filed at 06/05/2022 1311 Gross per 24 hour  Intake 805.12 ml  Output 1750 ml  Net -944.88 ml      06/05/2022    1:12 PM 06/04/2022    5:00 AM 06/03/2022    3:00 AM  Last 3 Weights  Weight (lbs) 114 lb 3.2 oz 113 lb 12.1 oz 108 lb 7.5 oz  Weight (kg) 51.8 kg 51.6 kg 49.2 kg      Telemetry    Sinus rhythm with frequent unifocal PVCs rates in the 60s- Personally Reviewed  ECG    No new  tracings- Personally Reviewed  Physical Exam   GEN: No acute distress.   Neck: No JVD Cardiac: RRR, no murmurs, rubs, or gallops.  Right radial cath site. 2+ radial pulse, OpSite and gauze dressing clean, dry, and intact.  Small amount of bruising noted to the side without bleeding or hematoma. Respiratory: Clear upper lobes, slightly diminished bases to auscultation bilaterally.  Respirations are unlabored at rest on 1 L of O2 via nasal cannula GI: Soft, nontender, non-distended  MS: No edema; No deformity. Neuro: Alert and oriented, range of motion is slightly limited to the left upper extremity but she is able to pick the arm up but movement this morning.  And yesterday. Psych: Normal affect   Labs    High Sensitivity Troponin:   Recent Labs  Lab 05/31/22 1456 05/31/22 1628 05/31/22 1629  TROPONINIHS 935* 1,582* 1,011*     Chemistry Recent Labs  Lab 05/31/22 1628 05/31/22 1629 06/01/22 0458 06/01/22 0906 06/02/22 0510 06/02/22 1052 06/03/22 0434 06/03/22 1222 06/04/22 0604 06/05/22 0802  NA 110*   < > 117*   < > 124*   < > 126* 127* 133* 132*  K 2.8*   < >  3.4*   < > 3.5   < > 4.1 4.5 5.1 4.7  CL <65*   < > 80*   < > 85*   < > 86* 86* 88* 89*  CO2 33*   < > 26   < > 29   < > 33* 33* 38* 37*  GLUCOSE 101*   < > 85   < > 128*   < > 123* 211* 110* 89  BUN 10   < > 9   < > 7*   < > 6* 7* 11 10  CREATININE 0.68   < > 0.60   < > 0.39*   < > 0.38* 0.50 0.38* 0.51  CALCIUM 8.4*   < > 7.9*   < > 8.0*   < > 8.3* 8.5* 8.9 8.2*  MG  --   --  1.8  --  2.2  --  2.1  --   --   --   PROT 6.4*  --  5.6*  --  5.6*  --   --   --   --   --   ALBUMIN 3.2*  --  2.7*  --  2.7*  --   --   --   --   --   AST 29  --  25  --  21  --   --   --   --   --   ALT 14  --  14  --  14  --   --   --   --   --   ALKPHOS 67  --  60  --  62  --   --   --   --   --   BILITOT 1.3*  --  1.0  --  0.8  --   --   --   --   --   GFRNONAA >60   < > >60   < > >60   < > >60 >60 >60 >60  ANIONGAP NOT  CALCULATED   < > 11   < > 10   < > '7 8 7 6   '$ < > = values in this interval not displayed.    Lipids  Recent Labs  Lab 06/02/22 0510  CHOL 98  TRIG 39  HDL 51  LDLCALC 39  CHOLHDL 1.9    Hematology Recent Labs  Lab 06/03/22 0434 06/04/22 0600 06/05/22 0802  WBC 14.7* 14.6* 11.9*  RBC 3.61* 3.64* 3.64*  HGB 10.9* 11.0* 11.1*  HCT 32.0* 34.2* 33.3*  MCV 88.6 94.0 91.5  MCH 30.2 30.2 30.5  MCHC 34.1 32.2 33.3  RDW 11.5 11.6 11.4*  PLT 353 347 317   Thyroid  Recent Labs  Lab 05/31/22 2109  TSH 0.184*  FREET4 1.23*    BNPNo results for input(s): "BNP", "PROBNP" in the last 168 hours.  DDimer No results for input(s): "DDIMER" in the last 168 hours.   Radiology    CARDIAC CATHETERIZATION  Result Date: 06/04/2022   Small, non-dominant RCA - Prox RCA lesion is 50% stenosed.   There is no aortic valve stenosis.   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 No LAD lesion to explain LAD regional wall motion abnormality. 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   There is  no aortic valve stenosis.   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 No LAD lesion to explain LAD regional wall motion abnormality. 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 Left ventricular ejection fraction, by estimation, is 55%. The left  ventricle has normal function. The left ventricle demonstrates regional  wall motion abnormalities (see scoring diagram/findings for description).  Left ventricular diastolic  parameters are consistent with Grade I diastolic dysfunction (impaired  relaxation). There is moderate hypokinesis of the left ventricular,   mid-apical anteroseptal wall.   2. Right ventricular systolic function is normal. The right ventricular  size is normal.   3. The mitral valve is normal in structure. No evidence of mitral valve  regurgitation.   4. The aortic valve is tricuspid. Aortic valve regurgitation is not  visualized.   Patient Profile     62 y.o. female with a history of hypertension and tobacco use who is being seen and evaluated for elevated troponin.  Assessment & Plan    Elevated high-sensitivity troponin is nonobstructive coronary artery disease -Patient remains chest pain-free since admission -Left heart catheterization with some minimal nonobstructive coronary artery disease completed on 06/04/2022 -Suspect stress-induced cardiomyopathy with demand ischemia not ACS -Echocardiogram revealed preserved LV systolic function with moderate hypokinesis of the left ventricle mid apical and anterior septal wall -She is continued on aspirin 81 mg daily and statin therapy -Spironolactone has been discontinued -Losartan 12.5 mg daily was started -Atorvastatin dosing was reduced to 40 mg daily -She is also being referred to cardiac rehab  Community-acquired pneumonia -Multifocal pneumonia seen on CT scan -Received Rocephin and Zithromax for 5 days -Continues to require low-dose oxygen -Management per IM  Hyponatremia severe alcohol arrival likely related to poor oral intake and HCTZ -Serum sodium of 132 -Spironolactone discontinued with the possibility of causing hyponatremia -Daily BMP -Continue to hold free water -Nephrology continues to follow  Hypertension -Blood pressure 115/69 -She is continued on Toprol-XL and losartan -Vital signs per unit protocol     For questions or updates, please contact [place] Please consult [email or web address] for contact info under        Signed, Rendon Howell, NP  06/05/2022, 1:19 PM

## 2022-06-05 NOTE — Assessment & Plan Note (Signed)
Saturating close to 100% on 1 L of oxygen. -Will try weaning her off -Ambulate with pulse ox

## 2022-06-06 DIAGNOSIS — R7989 Other specified abnormal findings of blood chemistry: Secondary | ICD-10-CM

## 2022-06-06 NOTE — Progress Notes (Signed)
SATURATION QUALIFICATIONS:   Patient Saturations on Room Air at Rest = 93%  Pt refusing ambulation for oxygen qualification test.  MD notified.  Pt SpO2 in 90s on room air at rest.  Will continue to educate pt and will attempt test with future ambulation.

## 2022-06-06 NOTE — Progress Notes (Signed)
Progress Note   Patient: Katie Moody [id number] DOB: Aug 07, 1960 DOA: 05/31/2022     6 DOS: the patient was seen and examined on 06/06/2022   Brief hospital course: 62 year old F with PMH of HTN and tobacco use disorder presenting with shortness of breath and productive cough for 5 days and generalized weakness for 2 days, dizzy nests and fall without trauma or loss of consciousness, and admitted to ICU with severe hyponatremia, hypokalemia, bibasilar pneumonia with mucous plugging and non-STEMI.   In ED, vital stable except for mild tachycardia. Na 110. K 2.8.  Lactic acid 3.0.  CXR without focal airspace opacity.  CTA chest negative for PE but opacification of the bilateral lower lobe posterior and lateral segmental airways concerning for mucous plugging.  CTA chest also showed scattered centrilobular and tree-in-bud airspace opacities within the bilateral lower lobes, medial segment of RML and anteromedial LUL.  Patient was started on IV NS fluid, IV Solu-Medrol, DuoNeb, antibiotic for CAP coverage and IV heparin for non-STEMI, and admitted to ICU.  Nephrology consulted for hyponatremia.    Patient's care transferred to [facility] hospitalist service on 2/26.  Code stroke activated the morning of 2/26 due to LUE weakness.  CT head and MRI without acute finding.  MRI cervical spine with some foraminal stenosis.  2/29.  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  Sodium up to 133. 3/1.  Sodium 132.  Patient only walked 8 feet with physical therapy.  Since patient does not have insurance, will have to go home with home health at some point.  Also unable to come off oxygen today. 3/2: Currently saturating well on 1 L of oxygen, we will try to completely wean off.  Also need to be ambulated with pulse ox.  Assessment and Plan: * Severe sepsis ([place]) Present on admission.  Completed Rocephin, Zithromax and Solu-Medrol.  CAP (community acquired  pneumonia) Multifocal pneumonia seen on CT scan.  Hyponatremia Secondary to hydrochlorothiazide.  Last sodium 132.  Myocardial ischemia Unclear if this is NSTEMI or not.  Patient completed 48 hours of heparin.  Continue aspirin.  Cardiology will do cardiac catheterization today  Generalized weakness Patient only able to walk 8 feet yesterday.  Without insurance, unable to go to rehab.  Will likely have to discharge home with home health.  Left arm weakness MRI of the brain negative for stroke.  Continue PT and OT evaluations.  Will need an outpatient EMG as per neurology  Malnutrition of moderate degree Continue supplements.  Underweight with a BMI of 17.36 with current height and weight in computer.  Acute respiratory failure with hypoxia and hypercapnia (HCC) Saturating close to 100% on 1 L of oxygen. -Will try weaning her off -Ambulate with pulse ox      Subjective: Patient was seen and examined today.  Sitting in chair comfortably.  No new concern.  Physical Exam: Vitals:   06/06/22 0500 06/06/22 0552 06/06/22 0753 06/06/22 1112  BP:  1'01/68 93/60 96/62 '$  Pulse:  76 76 79  Resp:  '20 15 18  '$ Temp:  98 F (36.7 C) 98.1 F (36.7 C) 97.7 F (36.5 C)  TempSrc:   Oral Oral  SpO2:  100% 98% 97%  Weight: 53.1 kg     Height:       General.  Malnourished lady, in no acute distress. Pulmonary.  Lungs clear bilaterally, normal respiratory effort. CV.  Regular rate and rhythm, no JVD, rub or murmur. Abdomen.  Soft, nontender, nondistended, BS positive. CNS.  Alert and oriented .  No focal neurologic deficit. Extremities.  No edema, no cyanosis, pulses intact and symmetrical. Psychiatry.  Judgment and insight appears normal.    Data Reviewed: Data reviewed.  Family Communication:   Disposition: Status is: Inpatient Remains inpatient appropriate because: Unfortunately unable to send out to rehab because she does not have insurance.  Patient's sister works 5 days a week.   Unfortunately only walked 8 feet today.  Will see if we can try to get her stronger before disposition.  Planned Discharge Destination: Home with Home Health  DVT prophylaxis.  Subcu heparin Time spent: 40 minutes  This record has been created using Systems analyst. Errors have been sought and corrected,but may not always be located. Such creation errors do not reflect on the standard of care.   Author: Lorella Nimrod, MD 06/06/2022 2:52 PM  For on call review [email or web address].

## 2022-06-07 LAB — LIPOPROTEIN A (LPA): Lipoprotein (a): 160.1 nmol/L — ABNORMAL HIGH (ref <75.0)

## 2022-06-07 NOTE — Progress Notes (Signed)
SATURATION QUALIFICATIONS: (This note is used to comply with regulatory documentation for home oxygen)  Patient Saturations on Room Air at Rest = 93%  Patient Saturations on Room Air while Ambulating = 83%  Patient Saturations on 1 Liters of oxygen while Ambulating = 91%  Please briefly explain why patient needs home oxygen:

## 2022-06-07 NOTE — Progress Notes (Signed)
Progress Note   Patient: Katie Moody [id number] DOB: [birth year] DOA: 05/31/2022     7 DOS: the patient was seen and examined on 06/07/2022   Brief hospital course: 62 year old F with PMH of HTN and tobacco use disorder presenting with shortness of breath and productive cough for 5 days and generalized weakness for 2 days, dizzy nests and fall without trauma or loss of consciousness, and admitted to ICU with severe hyponatremia, hypokalemia, bibasilar pneumonia with mucous plugging and non-STEMI.   In ED, vital stable except for mild tachycardia. Na 110. K 2.8.  Lactic acid 3.0.  CXR without focal airspace opacity.  CTA chest negative for PE but opacification of the bilateral lower lobe posterior and lateral segmental airways concerning for mucous plugging.  CTA chest also showed scattered centrilobular and tree-in-bud airspace opacities within the bilateral lower lobes, medial segment of RML and anteromedial LUL.  Patient was started on IV NS fluid, IV Solu-Medrol, DuoNeb, antibiotic for CAP coverage and IV heparin for non-STEMI, and admitted to ICU.  Nephrology consulted for hyponatremia.    Patient's care transferred to [facility] hospital[facility] service on 2/26.  Code stroke activated the morning of 2/26 due to LUE weakness.  CT head and MRI without acute finding.  MRI cervical spine with some foraminal stenosis.  2/29.  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  Sodium up to 133.  3/1.  Sodium 132.  Patient only walked 8 feet with physical therapy.  Since patient does not have insurance, will have to go home with home health at some point.  Also unable to come off oxygen today.  3/2: Currently saturating well on 1 L of oxygen, we will try to completely wean off.  Also need to be ambulated with pulse ox.  3/3: Hemodynamically stable, patient with some desaturation with ambulation requiring 1 to 2 L of oxygen.  DME oxygen and Home health services ordered  as she is not medically stable. Most likely can go home tomorrow.  Assessment and Plan: * Severe sepsis ([place]) Present on admission.  Completed Rocephin, Zithromax and Solu-Medrol.  CAP (community acquired pneumonia) Multifocal pneumonia seen on CT scan.  Hyponatremia Secondary to hydrochlorothiazide.  Last sodium 132.  Myocardial ischemia Unclear if this is NSTEMI or not.  Patient completed 48 hours of heparin.  Continue aspirin.  Cardiology will do cardiac catheterization today  Generalized weakness Patient only able to walk 8 feet yesterday.  Without insurance, unable to go to rehab.  Will likely have to discharge home with home health.  Left arm weakness MRI of the brain negative for stroke.  Continue PT and OT evaluations.  Will need an outpatient EMG as per neurology  Malnutrition of moderate degree Continue supplements.  Underweight with a BMI of 17.36 with current height and weight in computer.  Acute respiratory failure with hypoxia and hypercapnia (HCC) Saturating close to 100% on 1 L of oxygen. -Will try weaning her off -Ambulate with pulse ox      Subjective: Patient was seen and examined today.  No new complaints.  Physical Exam: Vitals:   06/07/22 0500 06/07/22 0603 06/07/22 0918 06/07/22 1313  BP:  (!) 93/58 (!) 106/57 (!) 97/59  Pulse:  79 81 86  Resp:  '18 18 18  '$ Temp:  98.2 F (36.8 C) 97.8 F (36.6 C) 98.1 F (36.7 C)  TempSrc:   Oral Oral  SpO2:  97% 93% 99%  Weight: 50.4 kg     Height:  General.  Malnourished lady, in no acute distress. Pulmonary.  Lungs clear bilaterally, normal respiratory effort. CV.  Regular rate and rhythm, no JVD, rub or murmur. Abdomen.  Soft, nontender, nondistended, BS positive. CNS.  Alert and oriented .  No focal neurologic deficit. Extremities.  No edema, no cyanosis, pulses intact and symmetrical. Psychiatry.  Judgment and insight appears normal.   Data Reviewed: Data reviewed.  Family Communication:  Discussed with sister on phone  Disposition: Status is: Inpatient Remains inpatient appropriate because: Unfortunately unable to send out to rehab because she does not have insurance.    Planned Discharge Destination: Home with Home Health  DVT prophylaxis.  Subcu heparin Time spent: 39 minutes  This record has been created using Systems analyst. Errors have been sought and corrected,but may not always be located. Such creation errors do not reflect on the standard of care.   Author: Lorella Nimrod, MD 06/07/2022 3:43 PM  For on call review [email or web address].

## 2022-06-08 ENCOUNTER — Other Ambulatory Visit: Payer: Self-pay

## 2022-06-08 DIAGNOSIS — I214 Non-ST elevation (NSTEMI) myocardial infarction: Secondary | ICD-10-CM

## 2022-06-08 DIAGNOSIS — E440 Moderate protein-calorie malnutrition: Secondary | ICD-10-CM

## 2022-06-08 DIAGNOSIS — J449 Chronic obstructive pulmonary disease, unspecified: Secondary | ICD-10-CM

## 2022-06-08 DIAGNOSIS — R29898 Other symptoms and signs involving the musculoskeletal system: Secondary | ICD-10-CM

## 2022-06-08 MED ORDER — LOSARTAN POTASSIUM 25 MG PO TABS
12.5000 mg | ORAL_TABLET | Freq: Every day | ORAL | 1 refills | Status: DC
  Filled 2022-06-08: qty 90, 180d supply, fill #0

## 2022-06-08 MED ORDER — ASPIRIN 81 MG PO TBEC
81.0000 mg | DELAYED_RELEASE_TABLET | Freq: Every day | ORAL | 12 refills | Status: DC
  Filled 2022-06-08: qty 30, 30d supply, fill #0

## 2022-06-08 MED ORDER — ATORVASTATIN CALCIUM 40 MG PO TABS
40.0000 mg | ORAL_TABLET | Freq: Every day | ORAL | 1 refills | Status: DC
  Filled 2022-06-08: qty 90, 90d supply, fill #0

## 2022-06-08 MED ORDER — METOPROLOL SUCCINATE ER 25 MG PO TB24
25.0000 mg | ORAL_TABLET | Freq: Every day | ORAL | 1 refills | Status: DC
  Filled 2022-06-08: qty 90, 90d supply, fill #0

## 2022-06-08 MED ORDER — ADULT MULTIVITAMIN W/MINERALS CH
1.0000 | ORAL_TABLET | Freq: Every day | ORAL | 1 refills | Status: DC
  Filled 2022-06-08: qty 90, 90d supply, fill #0

## 2022-06-08 NOTE — Progress Notes (Signed)
Occupational Therapy Treatment Patient Details Name: Katie Moody MRN: [id number] DOB: [birth year] Today's Date: 06/08/2022   History of present illness Pt is a 62 year old F with PMH of HTN and tobacco use disorder presenting with shortness of breath and productive cough for 5 days and generalized weakness for 2 days, dizzy and fall without trauma or loss of consciousness. MD assessment includes: Acute respiratory failure with hypercapnia, severe hyponatremia, NSTEMI, hypokalemia, noted LUE weakness with MRI negative for stroke, and lactic acidosis.   OT comments  Upon entering the room, pt seated in recliner chair and agreeable to OT intervention. OT educated pt on proper use of incentive spirometer with pt demonstrating x 5 reps with cuing. Focus on controlled sit <>stand from recliner chair with cuing for hand placement and technique with use of RW. Pt performed x 5 reps with min A progressing to supervision level and controlled descent. Pt making great progress towards goals but fatigues very quickly during session. Continue to recommend short term rehab to address functional deficits before returning home.    Recommendations for follow up therapy are one component of a multi-disciplinary discharge planning process, led by the attending physician.  Recommendations may be updated based on patient status, additional functional criteria and insurance authorization.    Follow Up Recommendations  Skilled nursing-short term rehab (<3 hours/day)     Assistance Recommended at Discharge Intermittent Supervision/Assistance  Patient can return home with the following  A little help with walking and/or transfers;A little help with bathing/dressing/bathroom;Help with stairs or ramp for entrance;Assist for transportation;Assistance with cooking/housework   Equipment Recommendations  Other (comment) (RW)       Precautions / Restrictions Precautions Precautions: Fall Restrictions Weight Bearing  Restrictions: No       Mobility Bed Mobility               General bed mobility comments: seated in recliner chair    Transfers Overall transfer level: Needs assistance Equipment used: Rolling walker (2 wheels) Transfers: Sit to/from Stand Sit to Stand: Min assist, Supervision                 Balance Overall balance assessment: Needs assistance Sitting-balance support: Feet supported Sitting balance-Leahy Scale: Good     Standing balance support: During functional activity, Bilateral upper extremity supported, Reliant on assistive device for balance Standing balance-Leahy Scale: Fair                             ADL either performed or assessed with clinical judgement    Extremity/Trunk Assessment Upper Extremity Assessment Upper Extremity Assessment: Generalized weakness   Lower Extremity Assessment Lower Extremity Assessment: Generalized weakness        Vision Patient Visual Report: No change from baseline            Cognition Arousal/Alertness: Awake/alert Behavior During Therapy: WFL for tasks assessed/performed Overall Cognitive Status: Within Functional Limits for tasks assessed                                                     Pertinent Vitals/ Pain       Pain Assessment Pain Assessment: No/denies pain         Frequency  Min 2X/week        Progress Toward Goals  OT Goals(current  goals can now be found in the care plan section)  Progress towards OT goals: Progressing toward goals     Plan Discharge plan remains appropriate;Frequency remains appropriate       AM-PAC OT "6 Clicks" Daily Activity     Outcome Measure   Help from another person eating meals?: None Help from another person taking care of personal grooming?: None Help from another person toileting, which includes using toliet, bedpan, or urinal?: A Little Help from another person bathing (including washing, rinsing, drying)?: A  Little Help from another person to put on and taking off regular upper body clothing?: A Little Help from another person to put on and taking off regular lower body clothing?: A Little 6 Click Score: 20    End of Session Equipment Utilized During Treatment: Rolling walker (2 wheels)  OT Visit Diagnosis: Unsteadiness on feet (R26.81);Repeated falls (R29.6);Muscle weakness (generalized) (M62.81)   Activity Tolerance Patient tolerated treatment well   Patient Left in bed;with call bell/phone within reach;with bed alarm set   Nurse Communication Mobility status        Time: [id number] OT Time Calculation (min): 18 min  Charges: OT General Charges $OT Visit: 1 Visit OT Treatments $Therapeutic Activity: 8-22 mins  Darleen Crocker, MS, OTR/L , CBIS ascom [phone]  06/08/22, 3:16 PM

## 2022-06-08 NOTE — Discharge Summary (Signed)
Physician Discharge Summary   Patient: Katie Moody MRN: [id number] DOB: [birth year]  Admit date:     05/31/2022  Discharge date: 06/08/22  Discharge Physician: Lorella Nimrod   PCP: [facility]   Recommendations at discharge:  Please obtain CBC and BMP in 1 week Follow-up with primary care provider within a week Follow-up with cardiac rehab Follow-up with cardiology  Discharge Diagnoses: Principal Problem:   Severe sepsis [facility]) Active Problems:   CAP (community acquired pneumonia)   Hyponatremia   Myocardial ischemia   Acute respiratory failure with hypoxia and hypercapnia (HCC)   Tobacco use disorder   Physical deconditioning   Lactic acidosis   Bandemia   Malnutrition of moderate degree   Left arm weakness   Elevated troponin level not due to acute coronary syndrome - NOT NSTEMI   Weakness   Stress-induced cardiomyopathy   Chronic obstructive pulmonary disease (HCC)   NSTEMI (non-ST elevated myocardial infarction) [facility])   Hospital Course: 62 year old F with PMH of HTN and tobacco use disorder presenting with shortness of breath and productive cough for 5 days and generalized weakness for 2 days, dizzy nests and fall without trauma or loss of consciousness, and admitted to ICU with severe hyponatremia, hypokalemia, bibasilar pneumonia with mucous plugging and non-STEMI.   In ED, vital stable except for mild tachycardia. Na 110. K 2.8.  Lactic acid 3.0.  CXR without focal airspace opacity.  CTA chest negative for PE but opacification of the bilateral lower lobe posterior and lateral segmental airways concerning for mucous plugging.  CTA chest also showed scattered centrilobular and tree-in-bud airspace opacities within the bilateral lower lobes, medial segment of RML and anteromedial LUL.  Patient was started on IV NS fluid, IV Solu-Medrol, DuoNeb, antibiotic for CAP coverage and IV heparin for non-STEMI, and admitted to ICU.  Nephrology consulted for  hyponatremia.    Patient's care transferred to [facility] hospitalist service on 2/26.  Code stroke activated the morning of 2/26 due to LUE weakness.  CT head and MRI without acute finding.  MRI cervical spine with some foraminal stenosis.  2/29.  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  Sodium up to 133.  3/1.  Sodium 132.  Patient only walked 8 feet with physical therapy.  Since patient does not have insurance, will have to go home with home health at some point.  Also unable to come off oxygen today.  3/2: Currently saturating well on 1 L of oxygen, we will try to completely wean off.  Also need to be ambulated with pulse ox.  3/3: Hemodynamically stable, patient with some desaturation with ambulation requiring 1 to 2 L of oxygen.  DME oxygen and Home health services ordered . Most likely can go home tomorrow.  3/4: Patient remained hemodynamically stable.  Lipoprotein a significantly elevated at 160.1. She was able to ambulate to the bathroom with the help of walker.  She wants to go home.  However TOC was able to find charitable home health.  Needing 1 to 2 L of oxygen with ambulation which was ordered.  Patient will continue on current medications and need to have a close follow-up with her providers for further recommendations.  Assessment and Plan: * Severe sepsis ([place]) Present on admission.  Completed Rocephin, Zithromax and Solu-Medrol.  CAP (community acquired pneumonia) Multifocal pneumonia seen on CT scan.  Hyponatremia Secondary to hydrochlorothiazide.  Last sodium 132.  Myocardial ischemia Unclear if this is NSTEMI or not.  Patient completed 71  hours of heparin.  Continue aspirin.  Cardiology will do cardiac catheterization today  Weakness Patient only able to walk 8 feet yesterday.  Without insurance, unable to go to rehab.  Will likely have to discharge home with home health.  Left arm weakness MRI of the brain negative  for stroke.  Continue PT and OT evaluations.  Will need an outpatient EMG as per neurology  Malnutrition of moderate degree Continue supplements.  Underweight with a BMI of 17.36 with current height and weight in computer.  Acute respiratory failure with hypoxia and hypercapnia (HCC) Saturating close to 100% on 1 L of oxygen. -Will try weaning her off -Ambulate with pulse ox         Consultants: Cardiology Procedures performed: Cardiac catheterization Disposition: Home health Diet recommendation:  Discharge Diet Orders (From admission, onward)     Start     Ordered   06/08/22 0000  Diet - low sodium heart healthy        06/08/22 1324           Cardiac diet DISCHARGE MEDICATION: Allergies as of 06/08/2022   No Known Allergies      Medication List     STOP taking these medications    hydrochlorothiazide 25 MG tablet Commonly known as: HYDRODIURIL       TAKE these medications    aspirin EC 81 MG tablet Take 1 tablet (81 mg total) by mouth daily. Swallow whole. Start taking on: June 09, 2022   atorvastatin 40 MG tablet Commonly known as: LIPITOR Take 1 tablet (40 mg total) by mouth daily. Start taking on: June 09, 2022   losartan 25 MG tablet Commonly known as: COZAAR Take 0.5 tablets (12.5 mg total) by mouth daily. Start taking on: June 09, 2022   meclizine 25 MG tablet Commonly known as: ANTIVERT Take 1 tablet (25 mg total) by mouth 3 (three) times daily as needed for dizziness.   metoprolol succinate 25 MG 24 hr tablet Commonly known as: TOPROL-XL Take 1 tablet (25 mg total) by mouth daily. Start taking on: June 09, 2022   multivitamin with minerals Tabs tablet Take 1 tablet by mouth daily. Start taking on: June 09, 2022   Proventil HFA 108 (90 Base) MCG/ACT inhaler Generic drug: albuterol Inhale 2 puffs into the lungs every 4 (four) hours as needed for shortness of breath or wheezing.               Durable Medical Equipment   (From admission, onward)           Start     Ordered   06/07/22 1538  For home use only DME oxygen  Once       Question Answer Comment  Length of Need 6 Months   Mode or (Route) Nasal cannula   Liters per Minute 2   Frequency Continuous (stationary and portable oxygen unit needed)   Oxygen conserving device Yes   Oxygen delivery system Gas      06/07/22 1537            Follow-up [place], [place]. Schedule an appointment as soon as possible for a visit in 1 week(s).   Specialty: General Practice Contact information: [address] [place] [address] [phone]         Leonie Man, MD. Schedule an appointment as soon as possible for a visit in 1 week(s).   Specialty: Cardiology Contact information: [address] [place] [address] [phone]                Discharge Exam: Danley Danker Weights   06/06/22 0500 06/07/22 0500 06/08/22 0407  Weight: 53.1 kg 50.4 kg 48 kg   General.  Malnourished lady, in no acute distress. Pulmonary.  Lungs clear bilaterally, normal respiratory effort. CV.  Regular rate and rhythm, no JVD, rub or murmur. Abdomen.  Soft, nontender, nondistended, BS positive. CNS.  Alert and oriented .  No focal neurologic deficit. Extremities.  No edema, no cyanosis, pulses intact and symmetrical. Psychiatry.  Judgment and insight appears normal.   Condition at discharge: stable  The results of significant diagnostics from this hospitalization (including imaging, microbiology, ancillary and laboratory) are listed below for reference.   Imaging Studies: CARDIAC CATHETERIZATION  Result Date: 06/04/2022   Small, non-dominant RCA - Prox RCA lesion is 50% stenosed.   There is no aortic valve stenosis.   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 No LAD lesion to explain LAD regional wall motion abnormality.  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 and Not ACS RECOMMENDATIONS Continue supportive care Glenetta Hew, MD  ECHOCARDIOGRAM COMPLETE  Result Date: 06/01/2022    ECHOCARDIOGRAM REPORT   Patient Name:   [facility] Date of Exam: 06/01/2022 Medical Rec #:  [id number]       Height:       68.0 in Accession #:    [id number]      Weight:       108.7 lb Date of Birth:  1960/05/17       BSA:          1.577 m Patient Age:    39 years        BP:           103/63 mmHg Patient Gender: F               HR:           87 bpm. Exam Location:  [facility] Procedure: 2D Echo, Cardiac Doppler and Color Doppler Indications:     Non- STEMI  History:         Patient has no prior history of Echocardiogram examinations.                  Risk Factors:Hypertension.  Sonographer:     Sherrie Sport Referring Phys:  [id number] Charlesetta Ivory GONFA Diagnosing Phys: Kate Sable MD  Sonographer Comments: Suboptimal parasternal window. IMPRESSIONS  1. Left ventricular ejection fraction, by estimation, is 55%. The left ventricle has normal function. The left ventricle demonstrates regional wall motion abnormalities (see scoring diagram/findings for description). Left ventricular diastolic parameters are consistent with Grade I diastolic dysfunction (impaired relaxation). There is moderate hypokinesis of the left ventricular, mid-apical anteroseptal wall.  2. Right ventricular systolic function is normal. The right ventricular size is normal.  3. The mitral valve is normal in structure. No evidence of mitral valve regurgitation.  4. The aortic valve is tricuspid. Aortic valve regurgitation is not visualized. FINDINGS  Left Ventricle: Left ventricular ejection fraction, by estimation, is 55%. The left ventricle has normal function. The left ventricle demonstrates regional wall motion abnormalities. Moderate hypokinesis of the left ventricular, mid-apical anteroseptal wall. The left ventricular internal cavity size was normal in size. There  is no left ventricular hypertrophy. Left ventricular diastolic parameters are consistent with Grade I diastolic dysfunction (impaired relaxation). Right Ventricle: The right ventricular size is normal.  No increase in right ventricular wall thickness. Right ventricular systolic function is normal. Left Atrium: Left atrial size was normal in size. Right Atrium: Right atrial size was normal in size. Pericardium: There is no evidence of pericardial effusion. Mitral Valve: The mitral valve is normal in structure. No evidence of mitral valve regurgitation. Tricuspid Valve: The tricuspid valve is normal in structure. Tricuspid valve regurgitation is not demonstrated. Aortic Valve: The aortic valve is tricuspid. Aortic valve regurgitation is not visualized. Aortic valve mean gradient measures 2.0 mmHg. Aortic valve peak gradient measures 3.5 mmHg. Aortic valve area, by VTI measures 2.39 cm. Pulmonic Valve: The pulmonic valve was not well visualized. Pulmonic valve regurgitation is not visualized. Aorta: The aortic root is normal in size and structure. Venous: The inferior vena cava was not well visualized. IAS/Shunts: No atrial level shunt detected by color flow Doppler.  LEFT VENTRICLE PLAX 2D LVIDd:         3.80 cm     Diastology LVIDs:         2.60 cm     LV e' medial:    6.96 cm/s LV PW:         1.00 cm     LV E/e' medial:  9.8 LV IVS:        1.10 cm     LV e' lateral:   10.30 cm/s LVOT diam:     1.90 cm     LV E/e' lateral: 6.6 LV SV:         43 LV SV Index:   27 LVOT Area:     2.84 cm  LV Volumes (MOD) LV vol d, MOD A2C: 51.3 ml LV vol d, MOD A4C: 72.3 ml LV vol s, MOD A2C: 30.1 ml LV vol s, MOD A4C: 39.6 ml LV SV MOD A2C:     21.2 ml LV SV MOD A4C:     72.3 ml LV SV MOD BP:      27.8 ml RIGHT VENTRICLE RV Basal diam:  3.30 cm RV Mid diam:    2.10 cm RV S prime:     13.60 cm/s LEFT ATRIUM             Index       RIGHT ATRIUM          Index LA diam:        2.00 cm 1.27 cm/m  RA Area:     7.02 cm LA Vol (A2C):    11.7 ml 7.42 ml/m  RA Volume:   11.60 ml 7.36 ml/m LA Vol (A4C):   9.2 ml  5.83 ml/m LA Biplane Vol: 10.5 ml 6.66 ml/m  AORTIC VALVE AV Area (Vmax):    2.62 cm AV Area (Vmean):   2.64 cm AV Area (VTI):     2.39 cm AV Vmax:           93.30 cm/s AV Vmean:          66.300 cm/s AV VTI:            0.178 m AV Peak Grad:      3.5 mmHg AV Mean Grad:      2.0 mmHg LVOT Vmax:         86.20 cm/s LVOT Vmean:        61.800 cm/s LVOT VTI:          0.150 m LVOT/AV VTI ratio: 0.84  AORTA Ao Root diam: 2.80 cm MITRAL VALVE  TRICUSPID VALVE MV Area (PHT): 6.17 cm    TR Peak grad:   18.1 mmHg MV Decel Time: 123 msec    TR Vmax:        213.00 cm/s MV E velocity: 68.10 cm/s MV A velocity: 96.40 cm/s  SHUNTS MV E/A ratio:  0.71        Systemic VTI:  0.15 m                            Systemic Diam: 1.90 cm Kate Sable MD Electronically signed by Kate Sable MD Signature Date/Time: 06/01/2022/4:07:32 PM    Final    MR BRAIN WO CONTRAST  Result Date: 06/01/2022 CLINICAL DATA:  Provided history: Neuro deficit, acute, stroke suspected. EXAM: MRI HEAD WITHOUT CONTRAST MRI CERVICAL SPINE WITHOUT CONTRAST TECHNIQUE: Multiplanar, multiecho pulse sequences of the brain and surrounding structures, and cervical spine, to include the craniocervical junction and cervicothoracic junction, were obtained without intravenous contrast. COMPARISON:  Noncontrast head CT performed earlier today 06/01/2022. FINDINGS: MRI HEAD FINDINGS Brain: Mild generalized cerebral atrophy. Multifocal T2 FLAIR hyperintense signal abnormality within the cerebral white matter, nonspecific but compatible with mild chronic small vessel ischemic disease. There is no acute infarct. No evidence of an intracranial mass. No chronic intracranial blood products. No extra-axial fluid collection. No midline shift. Vascular: Maintained flow voids within the proximal large arterial vessels. Skull and upper cervical spine: No focal suspicious marrow  lesion. Sinuses/Orbits: No mass or acute finding within the imaged orbits. Mild mucosal thickening within the left maxillary sinus. Other: Bilateral mastoid effusions (larger on the left). MRI CERVICAL SPINE FINDINGS Intermittently motion degraded exam. Most notably, the sagittal STIR sequence and axial T2 GRE sequences are moderate-to-severely motion degraded. Alignment: Straightening of the expected cervical lordosis. No significant spondylolisthesis. Vertebrae: Vertebral body height is maintained. No significant marrow edema or focal suspicious osseous lesion is identified. Small multilevel vertebral body hemangiomas. Cord: Within the limitations of motion degradation, no signal abnormality is identified within the cervical spinal cord. Posterior Fossa, vertebral arteries, paraspinal tissues: Posterior fossa assessed on same-day brain MRI. Flow voids preserved within the imaged cervical vertebral arteries. No paraspinal mass or collection. Disc levels: Moderate disc degeneration at C4-C5 and C5-C6. No more than mild disc degeneration at the remaining levels. C2-C3: Small central disc protrusion. No significant spinal canal or foraminal stenosis. C3-C4: Small central disc protrusion. Uncovertebral hypertrophy on the right. Mild effacement of the ventral thecal sac (without spinal cord mass effect). Mild right neural foraminal narrowing. C4-C5: Disc bulge with bilateral uncovertebral hypertrophy. Mild effacement of the ventral thecal sac (without spinal cord mass effect). Bilateral neural foraminal narrowing (moderate right, mild left). C5-C6: Disc bulge with bilateral uncovertebral hypertrophy. Mild effacement of the ventral thecal sac (without spinal cord mass effect). Bilateral neural foraminal narrowing (severe right, moderate/severe left). C6-C7: Slight disc bulge. No significant spinal canal or foraminal stenosis. C7-T1: Slight disc bulge. No significant spinal canal or foraminal stenosis. IMPRESSION: MRI  brain: 1.  No evidence of an acute intracranial abnormality. 2. Mild chronic small vessel ischemic changes within the cerebral white matter. 3. Mild generalized cerebral atrophy. 4. Mild mucosal thickening within the left maxillary sinus. 5. Bilateral mastoid effusions. MRI cervical spine: 1. Motion degraded examination, limiting evaluation. 2. Cervical spondylosis, as outlined. 3. No more than mild spinal canal stenosis. 4. Multilevel foraminal stenosis, as detailed and greatest on the right at C4-C5 (moderate) and bilaterally at C5-C6 (severe right, moderate/severe left).  5. Disc degeneration is greatest at C4-C5 and C5-C6 (moderate at these levels). Electronically Signed   By: Kellie Simmering D.O.   On: 06/01/2022 12:00   MR CERVICAL SPINE WO CONTRAST  Result Date: 06/01/2022 CLINICAL DATA:  Provided history: Neuro deficit, acute, stroke suspected. EXAM: MRI HEAD WITHOUT CONTRAST MRI CERVICAL SPINE WITHOUT CONTRAST TECHNIQUE: Multiplanar, multiecho pulse sequences of the brain and surrounding structures, and cervical spine, to include the craniocervical junction and cervicothoracic junction, were obtained without intravenous contrast. COMPARISON:  Noncontrast head CT performed earlier today 06/01/2022. FINDINGS: MRI HEAD FINDINGS Brain: Mild generalized cerebral atrophy. Multifocal T2 FLAIR hyperintense signal abnormality within the cerebral white matter, nonspecific but compatible with mild chronic small vessel ischemic disease. There is no acute infarct. No evidence of an intracranial mass. No chronic intracranial blood products. No extra-axial fluid collection. No midline shift. Vascular: Maintained flow voids within the proximal large arterial vessels. Skull and upper cervical spine: No focal suspicious marrow lesion. Sinuses/Orbits: No mass or acute finding within the imaged orbits. Mild mucosal thickening within the left maxillary sinus. Other: Bilateral mastoid effusions (larger on the left). MRI CERVICAL  SPINE FINDINGS Intermittently motion degraded exam. Most notably, the sagittal STIR sequence and axial T2 GRE sequences are moderate-to-severely motion degraded. Alignment: Straightening of the expected cervical lordosis. No significant spondylolisthesis. Vertebrae: Vertebral body height is maintained. No significant marrow edema or focal suspicious osseous lesion is identified. Small multilevel vertebral body hemangiomas. Cord: Within the limitations of motion degradation, no signal abnormality is identified within the cervical spinal cord. Posterior Fossa, vertebral arteries, paraspinal tissues: Posterior fossa assessed on same-day brain MRI. Flow voids preserved within the imaged cervical vertebral arteries. No paraspinal mass or collection. Disc levels: Moderate disc degeneration at C4-C5 and C5-C6. No more than mild disc degeneration at the remaining levels. C2-C3: Small central disc protrusion. No significant spinal canal or foraminal stenosis. C3-C4: Small central disc protrusion. Uncovertebral hypertrophy on the right. Mild effacement of the ventral thecal sac (without spinal cord mass effect). Mild right neural foraminal narrowing. C4-C5: Disc bulge with bilateral uncovertebral hypertrophy. Mild effacement of the ventral thecal sac (without spinal cord mass effect). Bilateral neural foraminal narrowing (moderate right, mild left). C5-C6: Disc bulge with bilateral uncovertebral hypertrophy. Mild effacement of the ventral thecal sac (without spinal cord mass effect). Bilateral neural foraminal narrowing (severe right, moderate/severe left). C6-C7: Slight disc bulge. No significant spinal canal or foraminal stenosis. C7-T1: Slight disc bulge. No significant spinal canal or foraminal stenosis. IMPRESSION: MRI brain: 1.  No evidence of an acute intracranial abnormality. 2. Mild chronic small vessel ischemic changes within the cerebral white matter. 3. Mild generalized cerebral atrophy. 4. Mild mucosal thickening  within the left maxillary sinus. 5. Bilateral mastoid effusions. MRI cervical spine: 1. Motion degraded examination, limiting evaluation. 2. Cervical spondylosis, as outlined. 3. No more than mild spinal canal stenosis. 4. Multilevel foraminal stenosis, as detailed and greatest on the right at C4-C5 (moderate) and bilaterally at C5-C6 (severe right, moderate/severe left). 5. Disc degeneration is greatest at C4-C5 and C5-C6 (moderate at these levels). Electronically Signed   By: Kellie Simmering D.O.   On: 06/01/2022 12:00   CT HEAD CODE STROKE WO CONTRAST`  Addendum Date: 06/01/2022   ADDENDUM REPORT: 06/01/2022 08:16 ADDENDUM: Study discussed by telephone with NP Donell Beers on 06/01/2022 at 0757 hours. Electronically Signed   By: Genevie Ann M.D.   On: 06/01/2022 08:16   Result Date: 06/01/2022 CLINICAL DATA:  Code stroke. 62 year old female new  left upper extremity weakness. EXAM: CT HEAD WITHOUT CONTRAST TECHNIQUE: Contiguous axial images were obtained from the base of the skull through the vertex without intravenous contrast. RADIATION DOSE REDUCTION: This exam was performed according to the departmental dose-optimization program which includes automated exposure control, adjustment of the mA and/or kV according to patient size and/or use of iterative reconstruction technique. COMPARISON:  None Available. FINDINGS: Brain: Cerebral volume is within normal limits for age. No midline shift, ventriculomegaly, mass effect, evidence of mass lesion, intracranial hemorrhage or evidence of cortically based acute infarction. Gray-white matter differentiation within normal limits the for age throughout the brain. Vascular: Calcified atherosclerosis at the skull base. No suspicious intracranial vascular hyperdensity. Skull: No acute osseous abnormality identified. Sinuses/Orbits: Mild to moderate left mastoid effusion. Left tympanic cavity appears to remain clear. Grossly negative visible nasopharynx. Other Visualized  paranasal sinuses and mastoids are clear. Other: Visualized orbits and scalp soft tissues are within normal limits. ASPECTS [facility] Stroke Program Early CT Score) Total score (0-10 with 10 being normal): 10 IMPRESSION: 1. Normal for age non contrast CT appearance of the brain. ASPECTS 10. 2. Left mastoid effusion, likely postinflammatory and significance doubtful. Electronically Signed: By: Genevie Ann M.D. On: 06/01/2022 07:53   CT Angio Chest PE W/Cm &/Or Wo Cm  Result Date: 05/31/2022 CLINICAL DATA:  Pulmonary embolism suspected. High probability. Near syncopal episode. Difficulty breathing. EXAM: CT ANGIOGRAPHY CHEST WITH CONTRAST TECHNIQUE: Multidetector CT imaging of the chest was performed using the standard protocol during bolus administration of intravenous contrast. Multiplanar CT image reconstructions and MIPs were obtained to evaluate the vascular anatomy. RADIATION DOSE REDUCTION: This exam was performed according to the departmental dose-optimization program which includes automated exposure control, adjustment of the mA and/or kV according to patient size and/or use of iterative reconstruction technique. CONTRAST:  47m OMNIPAQUE IOHEXOL 350 MG/ML SOLN COMPARISON:  AP chest 05/31/2022, chest two views 08/23/2021 FINDINGS: Cardiovascular: The main pulmonary artery is opacified up to 905 Hounsfield units. No filling defect is seen to indicate a pulmonary embolism. Heart size is normal. No pericardial effusion. No thoracic aortic aneurysm. Moderate atherosclerotic calcifications within the thoracic aorta. Mediastinum/Nodes: No axillary, mediastinal, or hilar pathologically enlarged lymph nodes I CT criteria. The visualized thyroid is unremarkable. Probable tiny sliding hiatal hernia. Lungs/Pleura: The central airways are patent. There is opacification of the bilateral lower lobe posterior and lateral segmental airways fairly symmetrically, possibly from mucous plugging (axial series 9 images 99 through  137). There is associated bilateral lower lobe posterior and lateral segmental mild bronchial wall thickening and mild bronchiectasis. There are scattered centrilobular and tree-in-bud airspace opacities within the bilateral lower lobes, greatest at the far inferior left-greater-than-right aspects. This suggests airways disseminated infection, possibly chronic, and possibly related to aspiration. There is also similar opacification of the medial segment of the right middle lobe airway with distal curvilinear and centrilobular/tree-in-bud opacities (axial series 9, images 109 through 129). Mild similar curvilinear and centrilobular nodular densities within the anteromedial left upper lobe (axial series 9, image 76). No pleural effusion or pneumothorax. Upper Abdomen: There is reflux of contrast into the inferior vena cava and hepatic veins, possibly from right heart insufficiency. No acute abnormality is seen within the upper abdomen. Musculoskeletal: Moderate multilevel degenerative disc changes of the midthoracic spine. Review of the MIP images confirms the above findings. IMPRESSION: 1. No pulmonary embolism is seen. 2. There is opacification of the bilateral lower lobe posterior and lateral segmental airways, possibly from mucous plugging. There is associated bilateral  lower lobe posterior and lateral segmental mild bronchial wall thickening and mild bronchiectasis. 3. There are scattered centrilobular and tree-in-bud airspace opacities within the bilateral lower lobes, medial segment of the right middle lobe, and anteromedial left upper lobe. This suggests mild multifocal airways disseminated infection, possibly chronic, and possibly related to aspiration. 4. There is reflux of contrast into the inferior vena cava and hepatic veins, possibly from right heart insufficiency. Electronically Signed   By: Yvonne Kendall M.D.   On: 05/31/2022 18:47   DG Chest Port 1 View  Result Date: 05/31/2022 CLINICAL DATA:   Sepsis EXAM: PORTABLE CHEST 1 VIEW COMPARISON:  08/23/21 CXR FINDINGS: No pleural effusion. No pneumothorax. No focal airspace opacity. Normal cardiac and mediastinal contours. No radiographically apparent displaced fracture. Visualized upper abdomen is notable for colonic gaseous distention IMPRESSION: 1. No focal airspace opacity. 2. Visualized upper abdomen is notable for colonic gaseous distention. If there is clinical concern for abdominal pathology, further evaluation with a dedicated abdominal radiograph is recommended. Electronically Signed   By: Marin Roberts M.D.   On: 05/31/2022 14:23    Microbiology: Results for orders placed or performed during the hospital encounter of 05/31/22  Blood Culture (routine x 2)     Status: None   Collection Time: 05/31/22  2:58 PM   Specimen: BLOOD  Result Value Ref Range Status   Specimen Description BLOOD RAC  Final   Special Requests   Final    BOTTLES DRAWN AEROBIC AND ANAEROBIC Blood Culture results may not be optimal due to an inadequate volume of blood received in culture bottles   Culture   Final    NO GROWTH 5 DAYS Performed at [facility], [address] [place], [place] [address]    Report Status 06/05/2022 FINAL  Final  Blood Culture (routine x 2)     Status: None   Collection Time: 05/31/22  3:03 PM   Specimen: BLOOD  Result Value Ref Range Status   Specimen Description BLOOD LAC  Final   Special Requests   Final    BOTTLES DRAWN AEROBIC AND ANAEROBIC Blood Culture adequate volume   Culture   Final    NO GROWTH 5 DAYS Performed at [facility], [address] [place], [place] [address]    Report Status 06/05/2022 FINAL  Final  Resp panel by RT-PCR (RSV, Flu A&B, Covid) Anterior Nasal Swab     Status: None   Collection Time: 05/31/22  4:28 PM   Specimen: Anterior Nasal Swab  Result Value Ref Range Status   SARS Coronavirus 2 by RT PCR NEGATIVE NEGATIVE Final    Comment: (NOTE) SARS-CoV-2 target nucleic  acids are NOT DETECTED.  The SARS-CoV-2 RNA is generally detectable in upper respiratory specimens during the acute phase of infection. The lowest concentration of SARS-CoV-2 viral copies this assay can detect is 138 copies/mL. A negative result does not preclude SARS-Cov-2 infection and should not be used as the sole basis for treatment or other patient management decisions. A negative result may occur with  improper specimen collection/handling, submission of specimen other than nasopharyngeal swab, presence of viral mutation(s) within the areas targeted by this assay, and inadequate number of viral copies(<138 copies/mL). A negative result must be combined with clinical observations, patient history, and epidemiological information. The expected result is Negative.  Fact Sheet for Patients:  EntrepreneurPulse.com.au  Fact Sheet for Healthcare Providers:  IncredibleEmployment.be  This test is no t yet approved or cleared by the [place] FDA  and  has been authorized for detection and/or diagnosis of SARS-CoV-2 by FDA under an Emergency Use Authorization (EUA). This EUA will remain  in effect (meaning this test can be used) for the duration of the COVID-19 declaration under Section 564(b)(1) of the Act, 21 U.S.C.section 360bbb-3(b)(1), unless the authorization is terminated  or revoked sooner.       Influenza A by PCR NEGATIVE NEGATIVE Final   Influenza B by PCR NEGATIVE NEGATIVE Final    Comment: (NOTE) The Xpert Xpress SARS-CoV-2/FLU/RSV plus assay is intended as an aid in the diagnosis of influenza from Nasopharyngeal swab specimens and should not be used as a sole basis for treatment. Nasal washings and aspirates are unacceptable for Xpert Xpress SARS-CoV-2/FLU/RSV testing.  Fact Sheet for Patients: EntrepreneurPulse.com.au  Fact Sheet for Healthcare Providers: IncredibleEmployment.be  This  test is not yet approved or cleared by the [place] FDA and has been authorized for detection and/or diagnosis of SARS-CoV-2 by FDA under an Emergency Use Authorization (EUA). This EUA will remain in effect (meaning this test can be used) for the duration of the COVID-19 declaration under Section 564(b)(1) of the Act, 21 U.S.C. section 360bbb-3(b)(1), unless the authorization is terminated or revoked.     Resp Syncytial Virus by PCR NEGATIVE NEGATIVE Final    Comment: (NOTE) Fact Sheet for Patients: EntrepreneurPulse.com.au  Fact Sheet for Healthcare Providers: IncredibleEmployment.be  This test is not yet approved or cleared by the [place] FDA and has been authorized for detection and/or diagnosis of SARS-CoV-2 by FDA under an Emergency Use Authorization (EUA). This EUA will remain in effect (meaning this test can be used) for the duration of the COVID-19 declaration under Section 564(b)(1) of the Act, 21 U.S.C. section 360bbb-3(b)(1), unless the authorization is terminated or revoked.  Performed at [facility], [address] [place], [place] [address]   MRSA Next Gen by PCR, Nasal     Status: None   Collection Time: 05/31/22  9:11 PM   Specimen: Nasal Mucosa; Nasal Swab  Result Value Ref Range Status   MRSA by PCR Next Gen NOT DETECTED NOT DETECTED Final    Comment: (NOTE) The GeneXpert MRSA Assay (FDA approved for NASAL specimens only), is one component of a comprehensive MRSA colonization surveillance program. It is not intended to diagnose MRSA infection nor to guide or monitor treatment for MRSA infections. Test performance is not FDA approved in patients less than 44 years old. Performed at [facility], [address] [place], [place] [address]     Labs: CBC: Recent Labs  Lab 06/02/22 0510 06/03/22 0434 06/04/22 0600 06/05/22 0802  WBC 22.2* 14.7* 14.6* 11.9*  NEUTROABS 19.4*  --   --    --   HGB 10.9* 10.9* 11.0* 11.1*  HCT 30.6* 32.0* 34.2* 33.3*  MCV 86.0 88.6 94.0 91.5  PLT 364 353 347 [id number]   Basic Metabolic Panel: Recent Labs  Lab 06/02/22 0510 06/02/22 1052 06/02/22 2258 06/03/22 0434 06/03/22 1222 06/04/22 0604 06/05/22 0802  NA 124*   < > 125* 126* 127* 133* 132*  K 3.5   < > 4.2 4.1 4.5 5.1 4.7  CL 85*   < > 84* 86* 86* 88* 89*  CO2 29   < > 35* 33* 33* 38* 37*  GLUCOSE 128*   < > 159* 123* 211* 110* 89  BUN 7*   < > 7* 6* 7* 11 10  CREATININE 0.39*   < > 0.42* 0.38* 0.50 0.38* 0.51  CALCIUM 8.0*   < > 8.1* 8.3* 8.5* 8.9 8.2*  MG 2.2  --   --  2.1  --   --   --   PHOS 1.7*  --   --  2.2*  --  3.1  --    < > = values in this interval not displayed.   Liver Function Tests: Recent Labs  Lab 06/02/22 0510  AST 21  ALT 14  ALKPHOS 62  BILITOT 0.8  PROT 5.6*  ALBUMIN 2.7*   CBG: No results for input(s): "GLUCAP" in the last 168 hours.  Discharge time spent: greater than 30 minutes.  This record has been created using Systems analyst. Errors have been sought and corrected,but may not always be located. Such creation errors do not reflect on the standard of care.   Signed: Lorella Nimrod, MD [facility] 06/08/2022

## 2022-06-08 NOTE — TOC Transition Note (Addendum)
Transition of Care [facility]) - CM/SW Discharge Note   Patient Details  Name: Katie Moody MRN: [id number] Date of Birth: 1960/10/25  [facility] CM/SW Contact:  Laurena Slimmer, RN Phone Number: 06/08/2022, 2:41 PM   Clinical Narrative:    Patient is uninsured Spoke with her at bedside regarding [place] and new oxygen needs. [place] arranged via [facility] [facility]. Patient advised [place] agency will reach out to her at discharge.  Patient is unable to pay for oxygen LOG requested for [employer] for oxygen.  Her brother will transport her home.   Per Cyril Mourning at [employer] order for oxygen has been processed and oxygen is in route for delivery.   TOC signing off.          Patient Goals and CMS Choice      Discharge Placement                         Discharge Plan and Services Additional resources added to the After Visit Summary for                                       Social Determinants of Health (SDOH) Interventions SDOH Screenings   Food Insecurity: No Food Insecurity (02/09/2022)  Transportation Needs: No Transportation Needs (02/09/2022)  Tobacco Use: Low Risk  (06/05/2022)     Readmission Risk Interventions     No data to display

## 2022-06-08 NOTE — Progress Notes (Signed)
Physical Therapy Treatment Patient Details Name: Katie Moody MRN: [id number] DOB: February 28, 1961 Today's Date: 06/08/2022   History of Present Illness Pt is a 62 year old F with PMH of HTN and tobacco use disorder presenting with shortness of breath and productive cough for 5 days and generalized weakness for 2 days, dizzy and fall without trauma or loss of consciousness. MD assessment includes: Acute respiratory failure with hypercapnia, severe hyponatremia, NSTEMI, hypokalemia, noted LUE weakness with MRI negative for stroke, and lactic acidosis.    PT Comments    Pt was pleasant and motivated to participate during the session and put forth good effort throughout. Pt found on 2LO2/min with SpO2 98-99%.  Nursing approved trial on room air (RA) with SpO2 and HR measured frequently during the session.  Pt's SpO2 on RA as follows: at rest 94%, after coming from sup to sitting at the EOB 89%, and after amb 8 feet 83%.  Pt returned to 2LO2/min with SpO2 increasing back to the mid 90s in < 60 sec.  Pt slowly progressing towards goals including requiring grossly less physical assistance with bed mobility and transfers and able to amb without assist for stability this session.  Pt will benefit from PT services in a SNF setting upon discharge to safely address deficits listed in patient problem list for decreased caregiver assistance and eventual return to PLOF.     Recommendations for follow up therapy are one component of a multi-disciplinary discharge planning process, led by the attending physician.  Recommendations may be updated based on patient status, additional functional criteria and insurance authorization.  Follow Up Recommendations  Skilled nursing-short term rehab (<3 hours/day) Can patient physically be transported by private vehicle: Yes   Assistance Recommended at Discharge Frequent or constant Supervision/Assistance  Patient can return home with the following A lot of help with walking  and/or transfers;A lot of help with bathing/dressing/bathroom;Assistance with cooking/housework;Assist for transportation;Help with stairs or ramp for entrance   Equipment Recommendations  Wheelchair (measurements PT)    Recommendations for Other Services       Precautions / Restrictions Precautions Precautions: Fall Restrictions Weight Bearing Restrictions: No     Mobility  Bed Mobility Overal bed mobility: Modified Independent             General bed mobility comments: Extra time and effort only during sup to sit    Transfers Overall transfer level: Needs assistance Equipment used: Rolling walker (2 wheels) Transfers: Sit to/from Stand Sit to Stand: Min assist, From elevated surface           General transfer comment: Min A to come to full upright position from an elevated EOB with cues for hand placement    Ambulation/Gait Ambulation/Gait assistance: Min guard Gait Distance (Feet): 8 Feet Assistive device: Rolling walker (2 wheels) Gait Pattern/deviations: Trunk flexed, Decreased step length - left, Decreased step length - right, Step-through pattern Gait velocity: decreased     General Gait Details: Slow cadence with short B step length but steady without LOB   Stairs             Wheelchair Mobility    Modified Rankin (Stroke Patients Only)       Balance Overall balance assessment: Needs assistance Sitting-balance support: Feet supported Sitting balance-Leahy Scale: Good     Standing balance support: During functional activity, Bilateral upper extremity supported, Reliant on assistive device for balance Standing balance-Leahy Scale: Fair  Cognition Arousal/Alertness: Awake/alert Behavior During Therapy: WFL for tasks assessed/performed Overall Cognitive Status: Within Functional Limits for tasks assessed                                          Exercises Total Joint  Exercises Ankle Circles/Pumps: Strengthening, Both, 10 reps Quad Sets: Strengthening, Both, 10 reps Gluteal Sets: Strengthening, Both, 10 reps Towel Squeeze: Strengthening, Both, 10 reps Heel Slides: Strengthening, Both, 5 reps Hip ABduction/ADduction: AROM, Strengthening, Both, 5 reps Long Arc Quad: AROM, Strengthening, Both, 10 reps, 5 reps Knee Flexion: AROM, Strengthening, Both, 10 reps, 5 reps Other Exercises Other Exercises: HEP review for BLE APs, QS, and GS x 10 each every 1-2 hours daily    General Comments        Pertinent Vitals/Pain Pain Assessment Pain Assessment: No/denies pain    Home Living                          Prior Function            PT Goals (current goals can now be found in the care plan section) Progress towards PT goals: Progressing toward goals    Frequency    Min 2X/week      PT Plan Current plan remains appropriate    Co-evaluation              AM-PAC PT "6 Clicks" Mobility   Outcome Measure  Help needed turning from your back to your side while in a flat bed without using bedrails?: A Little Help needed moving from lying on your back to sitting on the side of a flat bed without using bedrails?: A Little Help needed moving to and from a bed to a chair (including a wheelchair)?: A Little Help needed standing up from a chair using your arms (e.g., wheelchair or bedside chair)?: A Little Help needed to walk in hospital room?: A Lot Help needed climbing 3-5 steps with a railing? : Total 6 Click Score: 15    End of Session Equipment Utilized During Treatment: Gait belt;Oxygen Activity Tolerance: Patient tolerated treatment well Patient left: in chair;with call bell/phone within reach;with chair alarm set Nurse Communication: Mobility status PT Visit Diagnosis: Unsteadiness on feet (R26.81);Difficulty in walking, not elsewhere classified (R26.2);Muscle weakness (generalized) (M62.81)     Time: [id number] PT Time  Calculation (min) (ACUTE ONLY): 24 min  Charges:  $Therapeutic Exercise: 8-22 mins $Therapeutic Activity: 8-22 mins                     D. Scott Danesha Kirchoff PT, DPT 06/08/22, 12:04 PM

## 2022-06-11 ENCOUNTER — Other Ambulatory Visit: Payer: Self-pay

## 2022-06-16 ENCOUNTER — Telehealth: Payer: Self-pay | Admitting: Cardiology

## 2022-06-16 NOTE — Telephone Encounter (Signed)
Pt sister states she has been taking care of the pt and she has FMLA forms to be signed by the pt's provider but she is unsure if that would be Dr. Ellyn Hack or not.   Pt sister also stated pt has been swelling in her legs, she states she does not know if pt is SOB but she is on O2

## 2022-06-19 NOTE — Telephone Encounter (Signed)
Left voicemail message to call back  

## 2022-06-22 NOTE — Telephone Encounter (Signed)
She states that someone already let her know to take forms to her PCP office. She had no further needs.

## 2022-07-17 ENCOUNTER — Ambulatory Visit: Payer: Medicaid Other | Attending: Medical | Admitting: Medical

## 2022-07-17 ENCOUNTER — Encounter: Payer: Self-pay | Admitting: Medical

## 2022-07-17 VITALS — BP 140/78 | HR 71 | Ht 67.0 in | Wt 102.0 lb

## 2022-07-17 DIAGNOSIS — I259 Chronic ischemic heart disease, unspecified: Secondary | ICD-10-CM

## 2022-07-17 DIAGNOSIS — Z789 Other specified health status: Secondary | ICD-10-CM

## 2022-07-17 DIAGNOSIS — I5181 Takotsubo syndrome: Secondary | ICD-10-CM

## 2022-07-17 DIAGNOSIS — I2510 Atherosclerotic heart disease of native coronary artery without angina pectoris: Secondary | ICD-10-CM

## 2022-07-17 DIAGNOSIS — I10 Essential (primary) hypertension: Secondary | ICD-10-CM

## 2022-07-17 DIAGNOSIS — J449 Chronic obstructive pulmonary disease, unspecified: Secondary | ICD-10-CM

## 2022-07-17 MED ORDER — ASPIRIN 81 MG PO TBEC: 81.0000 mg | DELAYED_RELEASE_TABLET | Freq: Every day | ORAL | 3 refills | Status: DC

## 2022-07-17 NOTE — Patient Instructions (Signed)
Medication Instructions:  Your physician has recommended you make the following change in your medication:   START - aspirin EC 81 MG tablet - take 1 take by mouth daily  *If you need a refill on your cardiac medications before your next appointment, please call your pharmacy*  Lab Work: -None ordered If you have labs (blood work) drawn today and your tests are completely normal, you will receive your results only by: MyChart Message (if you have MyChart) OR A paper copy in the mail If you have any lab test that is abnormal or we need to change your treatment, we will call you to review the results.   Testing/Procedures: -None ordered  Follow-Up: At [facility], you and your health needs are our priority.  As part of our continuing mission to provide you with exceptional heart care, we have created designated Provider Care Teams.  These Care Teams include your primary Cardiologist (physician) and Advanced Practice Providers (APPs -  Physician Assistants and Nurse Practitioners) who all work together to provide you with the care you need, when you need it.  We recommend signing up for the patient portal called "MyChart".  Sign up information is provided on this After Visit Summary.  MyChart is used to connect with patients for Virtual Visits (Telemedicine).  Patients are able to view lab/test results, encounter notes, upcoming appointments, etc.  Non-urgent messages can be sent to your provider as well.   To learn more about what you can do with MyChart, go to [email or web address].    Your next appointment:   3 month(s)  Provider:   You may see Yvonne Kendall, MD or one of the following Advanced Practice Providers on your designated Care Team:   Nicolasa Ducking, NP Eula Listen, PA-C Cadence Fransico Michael, PA-C Charlsie Quest, NP   Other Instructions Referral to pulmonology

## 2022-07-17 NOTE — Progress Notes (Signed)
Cardiology Office Note:    Date:  07/17/2022   ID:  Katie Moody, DOB 17-Mar-1961, MRN [id number]  PCP:  [facility] Cardiologist:  None  [facility] Electrophysiologist:  None   Referring MD: [facility]   Chief Complaint: Hospital discharge  History of Present Illness:    Katie Moody is a 62 y.o. female with a hx of HTN, alcohol use, mild nonobstructive CAD and tobacco use who is being seen for hospital follow-up.  The patient was recently admitted to the hospital in March 2024 with sepsis due to an pneumonia, hyponatremia suspected 2/2 alcohol use, acute respiratory failure with hypoxia and hypercapnia found to have elevated troponin with non-STEMI as well as stress-induced cardiomyopathy.  Cardiac cath showed 50% RCA stenosis, likely stress-induced cardiomyopathy.  Echo showed preserved systolic function with moderate hypokinesis of the left ventricle mid apical and anterior septal wall.  Patient was started on guideline directed medical therapy. No chest pain was reported during the hospitalization.  Today, LHC and echo were reviewed. The patient reports persistent shortness of breath. She is walking at home and trying to get back to baseline. She denies chest pain. No lower leg edema. She if off the ASA, needs a prescription sent in . She has not smoked since coming out of the hospital. She is still drinking 2 beers a night. Right radial cath site is stable.   Past Medical History:  Diagnosis Date   Hypertension     Past Surgical History:  Procedure Laterality Date   ABDOMINAL HYSTERECTOMY     LEFT HEART CATH AND CORONARY ANGIOGRAPHY N/A 06/04/2022   Procedure: LEFT HEART CATH AND CORONARY ANGIOGRAPHY;  Surgeon: Marykay Lex, MD;  Location: [facility] INVASIVE CV LAB;  Service: Cardiovascular;  Laterality: N/A;    Current Medications: Current Meds  Medication Sig   aspirin EC 81 MG tablet Take 1 tablet (81 mg  total) by mouth daily. Swallow whole.   atorvastatin (LIPITOR) 40 MG tablet Take 1 tablet (40 mg total) by mouth daily.   losartan (COZAAR) 25 MG tablet Take 0.5 tablets (12.5 mg total) by mouth daily.   meclizine (ANTIVERT) 25 MG tablet Take 1 tablet (25 mg total) by mouth 3 (three) times daily as needed for dizziness.   metoprolol succinate (TOPROL-XL) 25 MG 24 hr tablet Take 1 tablet (25 mg total) by mouth daily.   Multiple Vitamin (MULTIVITAMIN WITH MINERALS) TABS tablet Take 1 tablet by mouth daily.   PROVENTIL HFA 108 (90 Base) MCG/ACT inhaler Inhale 2 puffs into the lungs every 4 (four) hours as needed for shortness of breath or wheezing.     Allergies:   Patient has no known allergies.   Social History   Socioeconomic History   Marital status: Single    Spouse name: Not on file   Number of children: 0   Years of education: Not on file   Highest education level: High school graduate  Occupational History   Not on file  Tobacco Use   Smoking status: Never   Smokeless tobacco: Never  Vaping Use   Vaping Use: Never used  Substance and Sexual Activity   Alcohol use: Yes    Comment: occasionally   Drug use: Never   Sexual activity: Not Currently    Birth control/protection: Post-menopausal  Other Topics Concern   Not on file  Social History Narrative   Not on file   Social Determinants of Health   Financial Resource  Strain: Not on file  Food Insecurity: No Food Insecurity (02/09/2022)   Hunger Vital Sign    Worried About Running Out of Food in the Last Year: Never true    Ran Out of Food in the Last Year: Never true  Transportation Needs: No Transportation Needs (02/09/2022)   PRAPARE - Administrator, Civil Service (Medical): No    Lack of Transportation (Non-Medical): No  Physical Activity: Not on file  Stress: Not on file  Social Connections: Not on file     Family History: The patient's family history is not on file.  ROS:   Please see the  history of present illness.     All other systems reviewed and are negative.  EKGs/Labs/Other Studies Reviewed:    The following studies were reviewed today:  LHC 06/04/22   Small, non-dominant RCA - Prox RCA lesion is 50% stenosed.   There is no aortic valve stenosis.   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 No LAD lesion to explain LAD regional wall motion abnormality. 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 and Not ACS    2D echo 06/01/2022: 1. Left ventricular ejection fraction, by estimation, is 55%. The left  ventricle has normal function. The left ventricle demonstrates regional  wall motion abnormalities (see scoring diagram/findings for description).  Left ventricular diastolic  parameters are consistent with Grade I diastolic dysfunction (impaired  relaxation). There is moderate hypokinesis of the left ventricular,  mid-apical anteroseptal wall.   2. Right ventricular systolic function is normal. The right ventricular  size is normal.   3. The mitral valve is normal in structure. No evidence of mitral valve  regurgitation.   4. The aortic valve is tricuspid. Aortic valve regurgitation is not  visualized.     EKG:  EKG is  ordered today.  The ekg ordered today demonstrates NSR 71bpm, TWI inferolateral leads  Recent Labs: 05/31/2022: TSH 0.184 06/02/2022: ALT 14 06/03/2022: Magnesium 2.1 06/05/2022: BUN 10; Creatinine, Ser 0.51; Hemoglobin 11.1; Platelets 317; Potassium 4.7; Sodium 132  Recent Lipid Panel    Component Value Date/Time   CHOL 98 06/02/2022 0510   TRIG 39 06/02/2022 0510   HDL 51 06/02/2022 0510   CHOLHDL 1.9 06/02/2022 0510   VLDL 8 06/02/2022 0510   LDLCALC 39 06/02/2022 0510     Physical Exam:    VS:  BP (!) 140/78 (BP Location: Left Arm, Patient Position: Sitting, Cuff Size: Normal)   Pulse 71   Ht 5\' 7"  (1.702 m)   Wt 102 lb (46.3 kg)   BMI 15.98 kg/m      Wt Readings from Last 3 Encounters:  07/17/22 102 lb (46.3 kg)  06/08/22 105 lb 13.1 oz (48 kg)  02/09/22 109 lb 4.8 oz (49.6 kg)     GEN:  Well nourished, well developed in no acute distress HEENT: Normal NECK: No JVD; No carotid bruits LYMPHATICS: No lymphadenopathy CARDIAC: RRR, no murmurs, rubs, gallops RESPIRATORY:  Clear to auscultation without rales, wheezing or rhonchi  ABDOMEN: Soft, non-tender, non-distended MUSCULOSKELETAL:  No edema; No deformity  SKIN: Warm and dry NEUROLOGIC:  Alert and oriented x 3 PSYCHIATRIC:  Normal affect   ASSESSMENT:    1. Coronary artery disease involving native coronary artery of native heart without angina pectoris   2. Myocardial ischemia   3. Stress-induced cardiomyopathy   4. Chronic obstructive pulmonary disease, unspecified COPD type   5. Essential hypertension   6.  Alcohol use    PLAN:    In order of problems listed above:  Elevated troponin Mild nonobstructive CAD Recent cath showed mild nonobstructive CAD. Echo showed LVEF 55%, G1DD. Patient reports persistent DOE, lung issues are likely contributing. No chest pain reported. Continue ASA  daily, we will send a prescription for this. Continue Liptior, Losartan and Toprol. No further ischemic work-up at this time.  CAP/DOE Patient reports persistent DOE since hospitalization. Patient reports she is using supplemental O2 at home since the hospitalization. I will refer to pulmonology for further management. She quit smoking since hospitalization.  HTN BP is mildly elevated. Continue Losartan and Toprol.  Alcohol use Patient is drinking 2 beers a night, cessation recommended.  Disposition: Follow up in 3 month(s) with MD    Signed, Zandyr Barnhill David Stall, PA-C  07/17/2022 9:09 AM    [facility]

## 2022-07-20 ENCOUNTER — Telehealth: Payer: Self-pay | Admitting: Medical

## 2022-07-20 MED ORDER — ASPIRIN 81 MG PO TBEC: 81.0000 mg | DELAYED_RELEASE_TABLET | Freq: Every day | ORAL | 0 refills | Status: DC

## 2022-07-20 NOTE — Telephone Encounter (Signed)
Requested Prescriptions   Signed Prescriptions Disp Refills   aspirin EC 81 MG tablet 90 tablet 0    Sig: Take 1 tablet (81 mg total) by mouth daily. Swallow whole.    Authorizing Provider: Marianne Sofia    Ordering User: Kendrick Fries

## 2022-07-20 NOTE — Telephone Encounter (Signed)
Med was sent to the wrong pharmacy.   *STAT* If patient is at the pharmacy, call can be transferred to refill team.   1. Which medications need to be refilled? (please list name of each medication and dose if known) aspirin EC 81 MG tablet   2. Which pharmacy/location (including street and city if local pharmacy) is medication to be sent to? [id number] [place], [place] - [address]   3. Do they need a 30 day or 90 day supply? 90 day

## 2022-08-04 ENCOUNTER — Encounter: Payer: Self-pay | Admitting: Student in an Organized Health Care Education/Training Program

## 2022-08-04 ENCOUNTER — Ambulatory Visit: Payer: Medicaid Other | Admitting: Student in an Organized Health Care Education/Training Program

## 2022-08-04 VITALS — BP 122/80 | HR 76 | Temp 97.7°F | Ht 67.0 in | Wt 105.0 lb

## 2022-08-04 DIAGNOSIS — Z23 Encounter for immunization: Secondary | ICD-10-CM | POA: Diagnosis not present

## 2022-08-04 DIAGNOSIS — J432 Centrilobular emphysema: Secondary | ICD-10-CM

## 2022-08-04 MED ORDER — UMECLIDINIUM-VILANTEROL 62.5-25 MCG/ACT IN AEPB: 1.0000 | INHALATION_SPRAY | Freq: Every day | RESPIRATORY_TRACT | 11 refills | Status: DC

## 2022-08-04 NOTE — Progress Notes (Signed)
Synopsis: Referred in COPD by Fransico Michael, Cadence H, PA-C  Assessment & Plan:   #Centrilobular emphysema (HCC) #Chronic Hypoxic Respiratory Failure  She is presenting for the evaluation of chronic hypoxic respiratory failure following a recent admission to the hospital where she was treated for COPD exacerbation and worked up for COPD. Chest imaging, on my review, is notable for significant emphysema. There was also chest wall thickening noted, and some tree-in-bud. This is overall not consistent with a bacterial pneumonia, but rather with a severe COPD exacerbation. I suspect patient's symptoms were more pronounced than what she was describing, and she had adjusted her activity given her shortness of breath.  I will obtain a pulmonary function test to confirm the diagnosis of COPD, and will also included an ABG to re-assess her CO2 level given elevation noted on her gas while hospitalized. If this remains elevated, she could benefit from nocturnal BiPAP therapy at home. I will empirically treat her for COPD with LABA/LAMA therapy given burden of symptoms and recent exacerbation. Furthermore, she failed her trending pulse oxymetry today and she would benefit from an oxygen concentrator. I will also measure her pulse oxymetry overnight to confirm nocturnal hypoxia to the patient. Finally, I recommend pneumococcal vaccination to the patient.  - umeclidinium-vilanterol (ANORO ELLIPTA) 62.5-25 MCG/ACT AEPB; Inhale 1 puff into the lungs daily.  Dispense: 30 each; Refill: 11 - Pulmonary Function Test [facility] Only; Future - Pneumococcal conjugate vaccine 20-valent - Pulse oximetry, overnight; Future - AMB REFERRAL FOR DME  2. Need for prophylactic vaccination against Streptococcus pneumoniae (pneumococcus)  - Pneumococcal conjugate vaccine 20-valent   Return in about 3 months (around 11/03/2022).  I spent 60 minutes caring for this patient today, including preparing to see the patient, obtaining a  medical history , reviewing a separately obtained history, performing a medically appropriate examination and/or evaluation, counseling and educating the patient/family/caregiver, ordering medications, tests, or procedures, documenting clinical information in the electronic health record, and independently interpreting results (not separately reported/billed) and communicating results to the patient/family/caregiver  [name], MD [facility] 08/04/2022 9:57 PM    End of visit medications:  Meds ordered this encounter  Medications   umeclidinium-vilanterol (ANORO ELLIPTA) 62.5-25 MCG/ACT AEPB    Sig: Inhale 1 puff into the lungs daily.    Dispense:  30 each    Refill:  11     Current Outpatient Medications:    aspirin EC 81 MG tablet, Take 1 tablet (81 mg total) by mouth daily. Swallow whole., Disp: 90 tablet, Rfl: 0   atorvastatin (LIPITOR) 40 MG tablet, Take 1 tablet (40 mg total) by mouth daily., Disp: 90 tablet, Rfl: 1   losartan (COZAAR) 25 MG tablet, Take 0.5 tablets (12.5 mg total) by mouth daily., Disp: 90 tablet, Rfl: 1   metoprolol succinate (TOPROL-XL) 25 MG 24 hr tablet, Take 1 tablet (25 mg total) by mouth daily., Disp: 90 tablet, Rfl: 1   Multiple Vitamin (MULTIVITAMIN WITH MINERALS) TABS tablet, Take 1 tablet by mouth daily., Disp: 90 tablet, Rfl: 1   PROVENTIL HFA 108 (90 Base) MCG/ACT inhaler, Inhale 2 puffs into the lungs every 4 (four) hours as needed for shortness of breath or wheezing., Disp: , Rfl:    umeclidinium-vilanterol (ANORO ELLIPTA) 62.5-25 MCG/ACT AEPB, Inhale 1 puff into the lungs daily., Disp: 30 each, Rfl: 11   meclizine (ANTIVERT) 25 MG tablet, Take 1 tablet (25 mg total) by mouth 3 (three) times daily as needed for dizziness. (Patient not taking: Reported  on 08/04/2022), Disp: 30 tablet, Rfl: 0   Subjective:   PATIENT ID: Katie Moody GENDER: female DOB: 1960-04-18, MRN: [id number]  Chief Complaint  Patient presents with    Consult    SOB with exertion since being in the hospital in Feb. No wheezing or cough. Was told she had COPD in the hospital.    HPI  Patient is a pleasant 62 year old female presenting to clinic to establish care.  She is overall reporting feeling well, but does experience shortness of breath with exertion. Her symptoms were really notable prior to admission to the hospital, with increased shortness of breath and cough productive of sputum. Symptoms are unclear prior to this. She seems to believe that her symptoms were minimal prior to this but family member accompanying her believes she was actually more symptomatic that she leads [place] to believe. Overall consensus that there was a cough as well as shortness of breath prior to said hospitalization.  Patient experienced worsening in her symptoms leading to presentation to the ED in February of 2024. She was admitted for for hypoxic and hypercapnic respiratory failure, and treated with antibiotics, steroids, and nebulizer treatment. She did briefly require BiPAP support, and CTA was negative for clot (with some mucus plugging and scattered tree-in-bud). Cardiology was consulted due to elevated troponin levels and she underwent LHC with non-obstructive CAD noted. Patient was eventually discharged home on oxygen support, and is presenting today to establish care.  In the interim, she reports exertional dyspnea, but feels that her cough is improved. She has not had any wheezing, no chest pain, no chest tightness, and stable shortness of breath. She is compliant with her oxygen therapy.  Patient is a former smoker, having smoked around half a pack a day for 30 years. She used to work at a [employer].   Ancillary information including prior medications, full medical/surgical/family/social histories, and PFTs (when available) are listed below and have been reviewed.   Review of Systems  Constitutional:  Negative for chills, fever and weight loss.   Respiratory:  Positive for cough, sputum production, shortness of breath and wheezing. Negative for hemoptysis.   Cardiovascular:  Negative for chest pain and palpitations.     Objective:   Vitals:   08/04/22 1052  BP: 122/80  Pulse: 76  Temp: 97.7 F (36.5 C)  SpO2: 90%  Weight: 105 lb (47.6 kg)  Height: 5\' 7"  (1.702 m)   90% on 3 LPM, attempted to trend on room air, patient's oxygen saturation decreased to 83%   BMI Readings from Last 3 Encounters:  08/04/22 16.45 kg/m  07/17/22 15.98 kg/m  06/08/22 16.09 kg/m   Wt Readings from Last 3 Encounters:  08/04/22 105 lb (47.6 kg)  07/17/22 102 lb (46.3 kg)  06/08/22 105 lb 13.1 oz (48 kg)    Physical Exam Constitutional:      Appearance: Normal appearance. She is not ill-appearing.  HENT:     Head: Normocephalic.     Mouth/Throat:     Mouth: Mucous membranes are moist.  Cardiovascular:     Rate and Rhythm: Normal rate and regular rhythm.     Pulses: Normal pulses.     Heart sounds: Normal heart sounds.  Pulmonary:     Effort: Pulmonary effort is normal.     Breath sounds: No wheezing.     Comments: Decreased breath sounds bilaterally Abdominal:     Palpations: Abdomen is soft.  Neurological:     General: No focal deficit present.  Mental Status: She is alert and oriented to person, place, and time. Mental status is at baseline.       Ancillary Information    Past Medical History:  Diagnosis Date   Hypertension      History reviewed. No pertinent family history.   Past Surgical History:  Procedure Laterality Date   ABDOMINAL HYSTERECTOMY     LEFT HEART CATH AND CORONARY ANGIOGRAPHY N/A 06/04/2022   Procedure: LEFT HEART CATH AND CORONARY ANGIOGRAPHY;  Surgeon: Marykay Lex, MD;  Location: [facility] INVASIVE CV LAB;  Service: Cardiovascular;  Laterality: N/A;    Social History   Socioeconomic History   Marital status: Single    Spouse name: Not on file   Number of children: 0   Years of  education: Not on file   Highest education level: High school graduate  Occupational History   Not on file  Tobacco Use   Smoking status: Former    Packs/day: 0.25    Years: 30.00    Additional pack years: 0.00    Total pack years: 7.50    Types: Cigarettes    Quit date: 05/2022    Years since quitting: 0.2   Smokeless tobacco: Never  Vaping Use   Vaping Use: Never used  Substance and Sexual Activity   Alcohol use: Yes    Comment: occasionally   Drug use: Never   Sexual activity: Not Currently    Birth control/protection: Post-menopausal  Other Topics Concern   Not on file  Social History Narrative   Not on file   Social Determinants of Health   Financial Resource Strain: Not on file  Food Insecurity: No Food Insecurity (02/09/2022)   Hunger Vital Sign    Worried About Running Out of Food in the Last Year: Never true    Ran Out of Food in the Last Year: Never true  Transportation Needs: No Transportation Needs (02/09/2022)   PRAPARE - Administrator, Civil Service (Medical): No    Lack of Transportation (Non-Medical): No  Physical Activity: Not on file  Stress: Not on file  Social Connections: Not on file  Intimate Partner Violence: Not on file     No Known Allergies   CBC    Component Value Date/Time   WBC 11.9 (H) 06/05/2022 0802   RBC 3.64 (L) 06/05/2022 0802   HGB 11.1 (L) 06/05/2022 0802   HCT 33.3 (L) 06/05/2022 0802   PLT 317 06/05/2022 0802   MCV 91.5 06/05/2022 0802   MCH 30.5 06/05/2022 0802   MCHC 33.3 06/05/2022 0802   RDW 11.4 (L) 06/05/2022 0802   LYMPHSABS 0.9 06/02/2022 0510   MONOABS 1.6 (H) 06/02/2022 0510   EOSABS 0.0 06/02/2022 0510   BASOSABS 0.0 06/02/2022 0510    Pulmonary Functions Testing Results:     No data to display          Outpatient Medications Prior to Visit  Medication Sig Dispense Refill   aspirin EC 81 MG tablet Take 1 tablet (81 mg total) by mouth daily. Swallow whole. 90 tablet 0   atorvastatin  (LIPITOR) 40 MG tablet Take 1 tablet (40 mg total) by mouth daily. 90 tablet 1   losartan (COZAAR) 25 MG tablet Take 0.5 tablets (12.5 mg total) by mouth daily. 90 tablet 1   metoprolol succinate (TOPROL-XL) 25 MG 24 hr tablet Take 1 tablet (25 mg total) by mouth daily. 90 tablet 1   Multiple Vitamin (MULTIVITAMIN WITH MINERALS) TABS tablet Take 1  tablet by mouth daily. 90 tablet 1   PROVENTIL HFA 108 (90 Base) MCG/ACT inhaler Inhale 2 puffs into the lungs every 4 (four) hours as needed for shortness of breath or wheezing.     meclizine (ANTIVERT) 25 MG tablet Take 1 tablet (25 mg total) by mouth 3 (three) times daily as needed for dizziness. (Patient not taking: Reported on 08/04/2022) 30 tablet 0   No facility-administered medications prior to visit.

## 2022-08-05 ENCOUNTER — Other Ambulatory Visit: Payer: Self-pay

## 2022-08-05 DIAGNOSIS — J432 Centrilobular emphysema: Secondary | ICD-10-CM

## 2022-09-03 ENCOUNTER — Ambulatory Visit: Payer: Medicaid Other | Attending: Student in an Organized Health Care Education/Training Program

## 2022-09-03 DIAGNOSIS — J432 Centrilobular emphysema: Secondary | ICD-10-CM | POA: Insufficient documentation

## 2022-09-03 LAB — PULMONARY FUNCTION TEST ARMC ONLY
DL/VA % pred: 33 %
DL/VA: 1.39 ml/min/mmHg/L
DLCO unc % pred: 29 %
DLCO unc: 6.46 ml/min/mmHg
FEF 25-75 Post: 0.25 L/sec
FEF 25-75 Pre: 0.25 L/sec
FEF2575-%Change-Post: -1 %
FEF2575-%Pred-Post: 10 %
FEF2575-%Pred-Pre: 10 %
FEV1-%Change-Post: 1 %
FEV1-%Pred-Post: 20 %
FEV1-%Pred-Pre: 20 %
FEV1-Post: 0.58 L
FEV1-Pre: 0.57 L
FEV1FVC-%Change-Post: 0 %
FEV1FVC-%Pred-Pre: 46 %
FEV6-%Change-Post: 1 %
FEV6-%Pred-Post: 43 %
FEV6-%Pred-Pre: 43 %
FEV6-Post: 1.51 L
FEV6-Pre: 1.49 L
FEV6FVC-%Change-Post: -1 %
FEV6FVC-%Pred-Post: 98 %
FEV6FVC-%Pred-Pre: 99 %
FVC-%Change-Post: 2 %
FVC-%Pred-Post: 44 %
FVC-%Pred-Pre: 43 %
FVC-Post: 1.6 L
FVC-Pre: 1.56 L
Post FEV1/FVC ratio: 36 %
Post FEV6/FVC ratio: 94 %
Pre FEV1/FVC ratio: 36 %
Pre FEV6/FVC Ratio: 95 %
RV % pred: 263 %
RV: 5.8 L
TLC % pred: 142 %
TLC: 7.84 L

## 2022-09-03 LAB — BLOOD GAS, ARTERIAL
Acid-Base Excess: 5.6 mmol/L — ABNORMAL HIGH (ref 0.0–2.0)
Bicarbonate: 31.1 mmol/L — ABNORMAL HIGH (ref 20.0–28.0)
O2 Content: 3 L/min
O2 Saturation: 99 %
Patient temperature: 37
pCO2 arterial: 48 mmHg (ref 32–48)
pH, Arterial: 7.42 (ref 7.35–7.45)
pO2, Arterial: 103 mmHg (ref 83–108)

## 2022-09-03 MED ORDER — ALBUTEROL SULFATE (2.5 MG/3ML) 0.083% IN NEBU
2.5000 mg | INHALATION_SOLUTION | Freq: Once | RESPIRATORY_TRACT | Status: AC
  Administered 2022-09-03: 2.5 mg via RESPIRATORY_TRACT
  Filled 2022-09-03: qty 3

## 2022-09-07 ENCOUNTER — Other Ambulatory Visit: Payer: Self-pay

## 2022-09-24 ENCOUNTER — Encounter: Payer: Self-pay | Admitting: Student in an Organized Health Care Education/Training Program

## 2022-10-09 LAB — LAB REPORT - SCANNED
A1c: 5.8
Albumin, Urine POC: 5.6
Creatinine, POC: 111.3 mg/dL
EGFR: 97
HM HIV Screening: NEGATIVE

## 2022-10-10 ENCOUNTER — Emergency Department: Payer: Medicaid Other

## 2022-10-10 ENCOUNTER — Emergency Department
Admission: EM | Admit: 2022-10-10 | Discharge: 2022-10-10 | Disposition: A | Discharge status: Home / Self Care | Payer: Medicaid Other | Attending: Emergency Medicine | Admitting: Emergency Medicine

## 2022-10-10 ENCOUNTER — Other Ambulatory Visit: Payer: Self-pay

## 2022-10-10 DIAGNOSIS — I10 Essential (primary) hypertension: Secondary | ICD-10-CM | POA: Diagnosis not present

## 2022-10-10 DIAGNOSIS — I1A0 Resistant hypertension: Secondary | ICD-10-CM | POA: Insufficient documentation

## 2022-10-10 DIAGNOSIS — R42 Dizziness and giddiness: Secondary | ICD-10-CM

## 2022-10-10 LAB — CBC WITH DIFFERENTIAL/PLATELET
Abs Immature Granulocytes: 0.01 10*3/uL (ref 0.00–0.07)
Basophils Absolute: 0 10*3/uL (ref 0.0–0.1)
Basophils Relative: 1 %
Eosinophils Absolute: 0.2 10*3/uL (ref 0.0–0.5)
Eosinophils Relative: 4 %
HCT: 41.3 % (ref 36.0–46.0)
Hemoglobin: 13.4 g/dL (ref 12.0–15.0)
Immature Granulocytes: 0 %
Lymphocytes Relative: 34 %
Lymphs Abs: 1.8 10*3/uL (ref 0.7–4.0)
MCH: 30.6 pg (ref 26.0–34.0)
MCHC: 32.4 g/dL (ref 30.0–36.0)
MCV: 94.3 fL (ref 80.0–100.0)
Monocytes Absolute: 0.5 10*3/uL (ref 0.1–1.0)
Monocytes Relative: 10 %
Neutro Abs: 2.7 10*3/uL (ref 1.7–7.7)
Neutrophils Relative %: 51 %
Platelets: 281 10*3/uL (ref 150–400)
RBC: 4.38 MIL/uL (ref 3.87–5.11)
RDW: 11.9 % (ref 11.5–15.5)
WBC: 5.2 10*3/uL (ref 4.0–10.5)
nRBC: 0 % (ref 0.0–0.2)

## 2022-10-10 LAB — COMPREHENSIVE METABOLIC PANEL
ALT: 19 U/L (ref 0–44)
AST: 21 U/L (ref 15–41)
Albumin: 4.2 g/dL (ref 3.5–5.0)
Alkaline Phosphatase: 101 U/L (ref 38–126)
Anion gap: 7 (ref 5–15)
BUN: 13 mg/dL (ref 8–23)
CO2: 27 mmol/L (ref 22–32)
Calcium: 9 mg/dL (ref 8.9–10.3)
Chloride: 101 mmol/L (ref 98–111)
Creatinine, Ser: 0.68 mg/dL (ref 0.44–1.00)
GFR, Estimated: >60 mL/min (ref >60)
Glucose, Bld: 96 mg/dL (ref 70–99)
Potassium: 4 mmol/L (ref 3.5–5.1)
Sodium: 135 mmol/L (ref 135–145)
Total Bilirubin: 0.9 mg/dL (ref 0.3–1.2)
Total Protein: 7.8 g/dL (ref 6.5–8.1)

## 2022-10-10 LAB — TROPONIN I (HIGH SENSITIVITY)
Troponin I (High Sensitivity): 3 ng/L (ref <18)
Troponin I (High Sensitivity): 3 ng/L (ref <18)

## 2022-10-10 MED ORDER — HYDRALAZINE HCL 50 MG PO TABS: 25.0000 mg | ORAL_TABLET | Freq: Once | ORAL | Status: DC

## 2022-10-10 MED ORDER — METOPROLOL SUCCINATE ER 25 MG PO TB24: 25.0000 mg | ORAL_TABLET | Freq: Every day | ORAL | 0 refills | Status: DC | PRN

## 2022-10-10 MED ORDER — HYDRALAZINE HCL 50 MG PO TABS
25.0000 mg | ORAL_TABLET | Freq: Once | ORAL | Status: AC
  Administered 2022-10-10: 25 mg via ORAL
  Filled 2022-10-10: qty 1

## 2022-10-10 MED ORDER — HYDRALAZINE HCL 20 MG/ML IJ SOLN
20.0000 mg | Freq: Once | INTRAMUSCULAR | Status: DC
  Filled 2022-10-10: qty 1

## 2022-10-10 NOTE — ED Provider Notes (Signed)
[facility] Provider Note   Event Date/Time   First MD Initiated Contact with Patient 10/10/22 1721     (approximate) History  Dizziness  HPI Chinita Tinari is a 62 y.o. female with past medical history of hypertension who presents for lightheadedness that began approximately 4 hours prior to arrival and was associated with hypertension.  Patient states she is taking her antihypertensive on time and as prescribed today.  Patient states that these episodes of lightheadedness began when she was walking and had no exacerbating or relieving factors.  Patient states that this lightheadedness has resolved throughout her emergency department course. ROS: Patient currently denies any vision changes, tinnitus, difficulty speaking, facial droop, sore throat, chest pain, shortness of breath, abdominal pain, nausea/vomiting/diarrhea, dysuria, or weakness/numbness/paresthesias in any extremity   Physical Exam  Triage Vital Signs: ED Triage Vitals  Enc Vitals Group     BP 10/10/22 1246 (!) 188/85     Pulse Rate 10/10/22 1246 64     Resp 10/10/22 1246 18     Temp 10/10/22 1246 98.4 F (36.9 C)     Temp Source 10/10/22 1246 Oral     SpO2 10/10/22 1246 92 %     Weight --      Height --      Head Circumference --      Peak Flow --      Pain Score 10/10/22 1240 0     Pain Loc --      Pain Edu? --      Excl. in GC? --    Most recent vital signs: Vitals:   10/10/22 1903 10/10/22 1905  BP: (!) 192/99 (!) 195/91  Pulse: (!) 57   Resp: 18   Temp: 97.9 F (36.6 C)   SpO2: 95%    General: Awake, oriented x4. CV:  Good peripheral perfusion.  Resp:  Normal effort.  Abd:  No distention.  Other:  Well-developed, well-nourished middle-aged African-American female laying in bed in no acute distress ED Results / Procedures / Treatments  Labs (all labs ordered are listed, but only abnormal results are displayed) Labs Reviewed  CBC WITH DIFFERENTIAL/PLATELET  COMPREHENSIVE  METABOLIC PANEL  TROPONIN I (HIGH SENSITIVITY)  TROPONIN I (HIGH SENSITIVITY)   EKG ED ECG REPORT I, Merwyn Katos, the attending physician, personally viewed and interpreted this ECG. Date: 10/10/2022 EKG Time: 1239 Rate: 63 Rhythm: normal sinus rhythm QRS Axis: normal Intervals: normal ST/T Wave abnormalities: normal Narrative Interpretation: no evidence of acute ischemia RADIOLOGY ED MD interpretation: CT of the head without contrast interpreted by me shows no evidence of acute abnormalities including no intracerebral hemorrhage, obvious masses, or significant edema -Agree with radiology assessment Official radiology report(s): CT Head Wo Contrast  Result Date: 10/10/2022 CLINICAL DATA:  Altered mental status. EXAM: CT HEAD WITHOUT CONTRAST TECHNIQUE: Contiguous axial images were obtained from the base of the skull through the vertex without intravenous contrast. RADIATION DOSE REDUCTION: This exam was performed according to the departmental dose-optimization program which includes automated exposure control, adjustment of the mA and/or kV according to patient size and/or use of iterative reconstruction technique. COMPARISON:  June 01, 2022 FINDINGS: Brain: No evidence of acute infarction, hemorrhage, hydrocephalus, extra-axial collection or mass lesion/mass effect. Vascular: No hyperdense vessel or unexpected calcification. Skull: Normal. Negative for fracture or focal lesion. Sinuses/Orbits: A small amount of left mastoid air cell fluid is seen. Other: None. IMPRESSION: No acute intracranial pathology. Electronically Signed   By: Demetrius Revel.D.  On: 10/10/2022 19:27   PROCEDURES: Critical Care performed: No .1-3 Lead EKG Interpretation  Performed by: Merwyn Katos, MD Authorized by: Merwyn Katos, MD     Interpretation: normal     ECG rate:  71   ECG rate assessment: normal     Rhythm: sinus rhythm     Ectopy: none     Conduction: normal    MEDICATIONS ORDERED  IN ED: Medications  hydrALAZINE (APRESOLINE) tablet 25 mg (25 mg Oral Given 10/10/22 1947)   IMPRESSION / MDM / ASSESSMENT AND PLAN / ED COURSE  I reviewed the triage vital signs and the nursing notes.                             The patient is on the cardiac monitor to evaluate for evidence of arrhythmia and/or significant heart rate changes. Patient's presentation is most consistent with acute presentation with potential threat to life or bodily function. Presents to the emergency department complaining of high blood pressure. Patient is otherwise asymptomatic without confusion, chest pain, hematuria, or SOB. Denies nonadherence to antihypertensive regimen DDx: CV, AMI, heart failure, renal infarction or failure or other end organ damage.  Disposition: Discussed with patient their elevated blood pressure and need for close outpatient management of their hypertension. Will provide a prescription for the patients previous antihypertensive medication and arrange for the patient to follow up in a primary care clinic   FINAL CLINICAL IMPRESSION(S) / ED DIAGNOSES   Final diagnoses:  Dizziness  Lightheadedness  Resistant hypertension   Rx / DC Orders   ED Discharge Orders          Ordered    metoprolol succinate (TOPROL XL) 25 MG 24 hr tablet  Daily PRN        10/10/22 1946           Note:  This document was prepared using Dragon voice recognition software and may include unintentional dictation errors.   Merwyn Katos, MD 10/10/22 2016

## 2022-10-10 NOTE — ED Notes (Deleted)
Flex

## 2022-10-10 NOTE — ED Triage Notes (Signed)
Pt c/o dizziness on and off since Thursday evening (2 days ago). Pt states she's had vertigo in the past. Pt on 2 liter's [place] chronically for COPD- o2 in 80'2-90's per pt. Pt is AOX4, no visual changes, numbness, or aphasia, upper and lower extremities equal in strength, pt denies weakness, CP, N/V, blood thinners.

## 2022-10-14 ENCOUNTER — Ambulatory Visit: Payer: Medicaid Other | Attending: Medical | Admitting: Medical

## 2022-10-14 ENCOUNTER — Encounter: Payer: Self-pay | Admitting: Medical

## 2022-10-14 VITALS — BP 144/82 | HR 66 | Ht 67.0 in | Wt 116.2 lb

## 2022-10-14 DIAGNOSIS — I10 Essential (primary) hypertension: Secondary | ICD-10-CM | POA: Diagnosis not present

## 2022-10-14 DIAGNOSIS — Z87891 Personal history of nicotine dependence: Secondary | ICD-10-CM | POA: Diagnosis not present

## 2022-10-14 DIAGNOSIS — J449 Chronic obstructive pulmonary disease, unspecified: Secondary | ICD-10-CM | POA: Diagnosis not present

## 2022-10-14 DIAGNOSIS — Z789 Other specified health status: Secondary | ICD-10-CM

## 2022-10-14 DIAGNOSIS — I2510 Atherosclerotic heart disease of native coronary artery without angina pectoris: Secondary | ICD-10-CM

## 2022-10-14 MED ORDER — LOSARTAN POTASSIUM 25 MG PO TABS: 25.0000 mg | ORAL_TABLET | Freq: Every day | ORAL | 1 refills | Status: DC

## 2022-10-14 NOTE — Patient Instructions (Signed)
Medication Instructions:  Your Physician recommend you continue on your current medication as directed.     *If you need a refill on your cardiac medications before your next appointment, please call your pharmacy*   Lab Work: No labs ordered today.  If you have labs (blood work) drawn today and your tests are completely normal, you will receive your results only by: MyChart Message (if you have MyChart) OR A paper copy in the mail If you have any lab test that is abnormal or we need to change your treatment, we will call you to review the results.   Testing/Procedures: No testing ordered today.    Follow-Up: At [facility], you and your health needs are our priority.  As part of our continuing mission to provide you with exceptional heart care, we have created designated Provider Care Teams.  These Care Teams include your primary Cardiologist (physician) and Advanced Practice Providers (APPs -  Physician Assistants and Nurse Practitioners) who all work together to provide you with the care you need, when you need it.  We recommend signing up for the patient portal called "MyChart".  Sign up information is provided on this After Visit Summary.  MyChart is used to connect with patients for Virtual Visits (Telemedicine).  Patients are able to view lab/test results, encounter notes, upcoming appointments, etc.  Non-urgent messages can be sent to your provider as well.   To learn more about what you can do with MyChart, go to [email or web address].    Your next appointment:   1 month(s)  Provider:     Terrilee Croak, PA-C

## 2022-10-14 NOTE — Progress Notes (Signed)
Cardiology Office Note:    Date:  10/14/2022   ID:  Katie Moody, DOB 08/22/1960, MRN [id number]  PCP:  [facility] Cardiologist:  None  [facility] Electrophysiologist:  None   Referring MD: [facility]   Chief Complaint: 3 month follow-up  History of Present Illness:    Katie Moody is a 62 y.o. female with a hx of HTN, alcohol use, mild nonobstructive CAD and tobacco use who is being seen for hospital follow-up.   The patient was admitted to the hospital in March 2024 with sepsis due to an pneumonia, hyponatremia suspected 2/2 alcohol use, acute respiratory failure with hypoxia and hypercapnia found to have elevated troponin with non-STEMI as well as stress-induced cardiomyopathy.  Cardiac cath showed 50% RCA stenosis, likely stress-induced cardiomyopathy.  Echo showed preserved systolic function with moderate hypokinesis of the left ventricle mid apical and anterior septal wall.  Patient was started on guideline directed medical therapy. No chest pain was reported during the hospitalization.  The patient was last seen 07/2022 and reported persistent shortness of breath. She was referred to pulmonology.   ER visit 10/10/22 for dizziness and BP was found to be severely elevated. She was given Hydralazine in the ER. Losartan and Toprol were refilled.   Today, the patient reports she is unsure what medications she is taking. She reports eating a lot of salty snacks. She occasionally feels off balance. No chest pain. Breathing is stable. She is on 2L O2. She sees pulmonology later this month. No lower leg edema.   Past Medical History:  Diagnosis Date   Hypertension     Past Surgical History:  Procedure Laterality Date   ABDOMINAL HYSTERECTOMY     LEFT HEART CATH AND CORONARY ANGIOGRAPHY N/A 06/04/2022   Procedure: LEFT HEART CATH AND CORONARY ANGIOGRAPHY;  Surgeon: Marykay Lex, MD;  Location: [facility] INVASIVE CV LAB;   Service: Cardiovascular;  Laterality: N/A;    Current Medications: Current Meds  Medication Sig   aspirin EC 81 MG tablet Take 1 tablet (81 mg total) by mouth daily. Swallow whole.   atorvastatin (LIPITOR) 40 MG tablet Take 1 tablet (40 mg total) by mouth daily.   meclizine (ANTIVERT) 25 MG tablet Take 1 tablet (25 mg total) by mouth 3 (three) times daily as needed for dizziness.   Multiple Vitamin (MULTIVITAMIN WITH MINERALS) TABS tablet Take 1 tablet by mouth daily.   PROVENTIL HFA 108 (90 Base) MCG/ACT inhaler Inhale 2 puffs into the lungs every 4 (four) hours as needed for shortness of breath or wheezing.   umeclidinium-vilanterol (ANORO ELLIPTA) 62.5-25 MCG/ACT AEPB Inhale 1 puff into the lungs daily.   [DISCONTINUED] losartan (COZAAR) 25 MG tablet Take 0.5 tablets (12.5 mg total) by mouth daily.     Allergies:   Patient has no known allergies.   Social History   Socioeconomic History   Marital status: Single    Spouse name: Not on file   Number of children: 0   Years of education: Not on file   Highest education level: High school graduate  Occupational History   Not on file  Tobacco Use   Smoking status: Former    Packs/day: 0.25    Years: 30.00    Additional pack years: 0.00    Total pack years: 7.50    Types: Cigarettes    Quit date: 05/2022    Years since quitting: 0.4   Smokeless tobacco: Never  Vaping Use  Vaping Use: Never used  Substance and Sexual Activity   Alcohol use: Yes    Comment: occasionally   Drug use: Never   Sexual activity: Not Currently    Birth control/protection: Post-menopausal  Other Topics Concern   Not on file  Social History Narrative   Not on file   Social Determinants of Health   Financial Resource Strain: Not on file  Food Insecurity: No Food Insecurity (02/09/2022)   Hunger Vital Sign    Worried About Running Out of Food in the Last Year: Never true    Ran Out of Food in the Last Year: Never true  Transportation Needs: No  Transportation Needs (02/09/2022)   PRAPARE - Administrator, Civil Service (Medical): No    Lack of Transportation (Non-Medical): No  Physical Activity: Not on file  Stress: Not on file  Social Connections: Not on file     Family History: The patient's family history is not on file.  ROS:   Please see the history of present illness.     All other systems reviewed and are negative.  EKGs/Labs/Other Studies Reviewed:    The following studies were reviewed today:  LHC 06/04/22   Small, non-dominant RCA - Prox RCA lesion is 50% stenosed.   There is no aortic valve stenosis.   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 No LAD lesion to explain LAD regional wall motion abnormality. 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 and Not ACS    2D echo 06/01/2022: 1. Left ventricular ejection fraction, by estimation, is 55%. The left  ventricle has normal function. The left ventricle demonstrates regional  wall motion abnormalities (see scoring diagram/findings for description).  Left ventricular diastolic  parameters are consistent with Grade I diastolic dysfunction (impaired  relaxation). There is moderate hypokinesis of the left ventricular,  mid-apical anteroseptal wall.   2. Right ventricular systolic function is normal. The right ventricular  size is normal.   3. The mitral valve is normal in structure. No evidence of mitral valve  regurgitation.   4. The aortic valve is tricuspid. Aortic valve regurgitation is not  visualized.   EKG:  EKG is ordered today.  The ekg ordered today demonstrates NSR 62bpm TWI inferolateral leads  Recent Labs: 05/31/2022: TSH 0.184 06/03/2022: Magnesium 2.1 10/10/2022: ALT 19; BUN 13; Creatinine, Ser 0.68; Hemoglobin 13.4; Platelets 281; Potassium 4.0; Sodium 135  Recent Lipid Panel    Component Value Date/Time   CHOL 98 06/02/2022 0510   TRIG 39 06/02/2022  0510   HDL 51 06/02/2022 0510   CHOLHDL 1.9 06/02/2022 0510   VLDL 8 06/02/2022 0510   LDLCALC 39 06/02/2022 0510     Physical Exam:    VS:  BP (!) 144/82 (BP Location: Left Arm, Patient Position: Sitting)   Pulse 66   Ht 5\' 7"  (1.702 m)   Wt 116 lb 3.2 oz (52.7 kg)   SpO2 98% Comment: 2L  BMI 18.20 kg/m     Wt Readings from Last 3 Encounters:  10/14/22 116 lb 3.2 oz (52.7 kg)  10/10/22 104 lb 15 oz (47.6 kg)  08/04/22 105 lb (47.6 kg)     GEN:  Well nourished, well developed in no acute distress HEENT: Normal NECK: No JVD; No carotid bruits LYMPHATICS: No lymphadenopathy CARDIAC: RRR, no murmurs, rubs, gallops RESPIRATORY:  Clear to auscultation without rales, wheezing or rhonchi  ABDOMEN: Soft, non-tender, non-distended MUSCULOSKELETAL:  No edema; No deformity  SKIN: Warm and dry NEUROLOGIC:  Alert and oriented x 3 PSYCHIATRIC:  Normal affect   ASSESSMENT:    1. Coronary artery disease involving native coronary artery of native heart without angina pectoris   2. Essential hypertension   3. Chronic obstructive pulmonary disease, unspecified COPD type (HCC)   4. History of tobacco use   5. Alcohol use    PLAN:    In order of problems listed above:  Mild nonobstructive CAD Cath showed mild nonobstructive CD. Echo showed normal LVEF. No chest pain reported. Continue Aspirin, Lipitor, Losartan and Toprol. No further ischemic work-up at this time.   HTN Recent ER visit for dizziness found to have elevated BP. Losartan and metoprolol were refilled. She is unsure what dosage she is taking. We verified and she has losartan 25mg  and Toprol 25mg  daily. We will continue these medications for now. She will check BP at home and we will see her back in 1 month for a BP check.   H/o tobacco use She quit in March 2024. She is using 2L O2. She has appointment with pulmonology scheduled later this month.   Alcohol use She drinks 3 beers weekly.   Disposition: Follow up in 1  month(s) with MD/APP     Signed, Halden Phegley David Stall, PA-C  10/14/2022 4:53 PM     [facility]

## 2022-10-16 ENCOUNTER — Ambulatory Visit: Payer: Medicaid Other | Admitting: Medical

## 2022-10-26 ENCOUNTER — Ambulatory Visit: Payer: Medicaid Other | Admitting: Student in an Organized Health Care Education/Training Program

## 2022-10-26 ENCOUNTER — Encounter: Payer: Self-pay | Admitting: Student in an Organized Health Care Education/Training Program

## 2022-10-26 VITALS — BP 124/80 | HR 84 | Temp 97.6°F | Ht 62.0 in | Wt 118.6 lb

## 2022-10-26 DIAGNOSIS — J432 Centrilobular emphysema: Secondary | ICD-10-CM | POA: Diagnosis not present

## 2022-10-26 DIAGNOSIS — J9611 Chronic respiratory failure with hypoxia: Secondary | ICD-10-CM

## 2022-10-26 NOTE — Patient Instructions (Signed)
I will see you for follow up in 6 months. Please continue using your oxygen and inhaler as you are currently doing.  Once it becomes available, please get the influenza vaccine in the pharmacy. Please get the COVID-19 booster vaccine Also please get the RSV vaccine at your local pharmacy

## 2022-10-26 NOTE — Progress Notes (Signed)
Assessment & Plan:   #GOLD 4E COPD #Centrilobular emphysema (HCC) #Chronic Hypoxic Respiratory Failure   She is presenting for the evaluation of chronic hypoxic respiratory failure following a recent admission to the hospital where she was treated for COPD exacerbation and worked up for COPD. Chest imaging, on my review, is notable for significant emphysema. There was also chest wall thickening noted, and some tree-in-bud. This is overall not consistent with a bacterial pneumonia, but rather with a severe COPD exacerbation.  Since her initial visit she's underwent PFT's that showed very severe COPD with FEV1 of 20% predicted and significant air trapping and drop in DLCO (29% predicted). ABG showed a CO2 level of 48 and she would not qualify for NIPPV moving forward. Given she's only had one exacerbation, and she doesn't have eosinophilia historically, I will hold off on adding ICS to her regimen and continue with Anoro Ellipta for the time being.  -continue Anoro Ellipa -Recommended COVID, influenza and RSV vaccines -pneumococcal vaccine received during a prior visit   Return in about 6 months (around 04/28/2023).  I spent 30 minutes caring for this patient today, including preparing to see the patient, obtaining a medical history , reviewing a separately obtained history, performing a medically appropriate examination and/or evaluation, counseling and educating the patient/family/caregiver, and documenting clinical information in the electronic health record  [name], MD [facility] 10/26/2022 11:04 AM    End of visit medications:  No orders of the defined types were placed in this encounter.    Current Outpatient Medications:    aspirin EC 81 MG tablet, Take 1 tablet (81 mg total) by mouth daily. Swallow whole., Disp: 90 tablet, Rfl: 0   atorvastatin (LIPITOR) 40 MG tablet, Take 1 tablet (40 mg total) by mouth daily., Disp: 90 tablet, Rfl: 1   losartan  (COZAAR) 25 MG tablet, Take 1 tablet (25 mg total) by mouth daily., Disp: 90 tablet, Rfl: 1   meclizine (ANTIVERT) 25 MG tablet, Take 1 tablet (25 mg total) by mouth 3 (three) times daily as needed for dizziness., Disp: 30 tablet, Rfl: 0   metoprolol succinate (TOPROL-XL) 25 MG 24 hr tablet, Take 1 tablet (25 mg total) by mouth daily., Disp: 90 tablet, Rfl: 1   Multiple Vitamin (MULTIVITAMIN WITH MINERALS) TABS tablet, Take 1 tablet by mouth daily., Disp: 90 tablet, Rfl: 1   PROVENTIL HFA 108 (90 Base) MCG/ACT inhaler, Inhale 2 puffs into the lungs every 4 (four) hours as needed for shortness of breath or wheezing., Disp: , Rfl:    umeclidinium-vilanterol (ANORO ELLIPTA) 62.5-25 MCG/ACT AEPB, Inhale 1 puff into the lungs daily., Disp: 30 each, Rfl: 11   Subjective:   PATIENT ID: Katie Moody GENDER: female DOB: 1961-01-21, MRN: [id number]  Chief Complaint  Patient presents with   Follow-up    SOB with exertion.     HPI  Patient is a pleasant 62 year old female presenting to clinic for follow up.   She is overall reporting feeling well, and with the Anoro Ellipta and the oxygen therapy her symptoms are significantly improved. Her shortness of breath is less pronounced and she is able to work in the garden without limitation.  Her symptoms were really notable prior to admission to the hospital, with increased shortness of breath and cough productive of sputum. Symptoms are unclear prior to this. She seems to believe that her symptoms were minimal prior to this but family member accompanying her during a previous visit reported  that she was actually more symptomatic than she leads [place] to believe. Overall consensus that there was a cough as well as shortness of breath prior to said hospitalization.   Patient experienced worsening in her symptoms leading to presentation to the ED in February of 2024. She was admitted for for hypoxic and hypercapnic respiratory failure, and treated with  antibiotics, steroids, and nebulizer treatment. She did briefly require BiPAP support, and CTA was negative for clot (with some mucus plugging and scattered tree-in-bud). Cardiology was consulted due to elevated troponin levels and she underwent LHC with non-obstructive CAD noted. Patient was eventually discharged home on oxygen support, and is presenting today to establish care.   Patient is a former smoker, having smoked around half a pack a day for 30 years. She used to work at a [employer].   Ancillary information including prior medications, full medical/surgical/family/social histories, and PFTs (when available) are listed below and have been reviewed.   Review of Systems  Constitutional:  Negative for chills, fever and weight loss.  Respiratory:  Positive for cough and shortness of breath. Negative for hemoptysis, sputum production and wheezing.   Cardiovascular:  Negative for chest pain and palpitations.     Objective:   Vitals:   10/26/22 1050  BP: 124/80  Pulse: 84  Temp: 97.6 F (36.4 C)  TempSrc: Temporal  SpO2: 92%  Weight: 118 lb 9.6 oz (53.8 kg)  Height: 5\' 2"  (1.575 m)   92% on 3 LPM  BMI Readings from Last 3 Encounters:  10/26/22 21.69 kg/m  10/14/22 18.20 kg/m  10/10/22 16.44 kg/m   Wt Readings from Last 3 Encounters:  10/26/22 118 lb 9.6 oz (53.8 kg)  10/14/22 116 lb 3.2 oz (52.7 kg)  10/10/22 104 lb 15 oz (47.6 kg)    Physical Exam Constitutional:      Appearance: Normal appearance. She is not ill-appearing.  HENT:     Head: Normocephalic.     Mouth/Throat:     Mouth: Mucous membranes are moist.  Cardiovascular:     Rate and Rhythm: Normal rate and regular rhythm.     Pulses: Normal pulses.     Heart sounds: Normal heart sounds.  Pulmonary:     Effort: Pulmonary effort is normal.     Breath sounds: No wheezing or rales.     Comments: Decreased breath sounds bilaterally Abdominal:     Palpations: Abdomen is soft.  Neurological:      General: No focal deficit present.     Mental Status: She is alert and oriented to person, place, and time. Mental status is at baseline.       Ancillary Information    Past Medical History:  Diagnosis Date   Hypertension      No family history on file.   Past Surgical History:  Procedure Laterality Date   ABDOMINAL HYSTERECTOMY     LEFT HEART CATH AND CORONARY ANGIOGRAPHY N/A 06/04/2022   Procedure: LEFT HEART CATH AND CORONARY ANGIOGRAPHY;  Surgeon: Marykay Lex, MD;  Location: [facility] INVASIVE CV LAB;  Service: Cardiovascular;  Laterality: N/A;    Social History   Socioeconomic History   Marital status: Single    Spouse name: Not on file   Number of children: 0   Years of education: Not on file   Highest education level: High school graduate  Occupational History   Not on file  Tobacco Use   Smoking status: Former    Current packs/day: 0.00    Average  packs/day: 0.3 packs/day for 30.0 years (7.5 ttl pk-yrs)    Types: Cigarettes    Start date: 05/1992    Quit date: 05/2022    Years since quitting: 0.4   Smokeless tobacco: Never  Vaping Use   Vaping status: Never Used  Substance and Sexual Activity   Alcohol use: Yes    Comment: occasionally   Drug use: Never   Sexual activity: Not Currently    Birth control/protection: Post-menopausal  Other Topics Concern   Not on file  Social History Narrative   Not on file   Social Determinants of Health   Financial Resource Strain: Not on file  Food Insecurity: No Food Insecurity (02/09/2022)   Hunger Vital Sign    Worried About Running Out of Food in the Last Year: Never true    Ran Out of Food in the Last Year: Never true  Transportation Needs: No Transportation Needs (02/09/2022)   PRAPARE - Administrator, Civil Service (Medical): No    Lack of Transportation (Non-Medical): No  Physical Activity: Not on file  Stress: Not on file  Social Connections: Not on file  Intimate Partner Violence: Not  on file     No Known Allergies   CBC    Component Value Date/Time   WBC 5.2 10/10/2022 1249   RBC 4.38 10/10/2022 1249   HGB 13.4 10/10/2022 1249   HCT 41.3 10/10/2022 1249   PLT 281 10/10/2022 1249   MCV 94.3 10/10/2022 1249   MCH 30.6 10/10/2022 1249   MCHC 32.4 10/10/2022 1249   RDW 11.9 10/10/2022 1249   LYMPHSABS 1.8 10/10/2022 1249   MONOABS 0.5 10/10/2022 1249   EOSABS 0.2 10/10/2022 1249   BASOSABS 0.0 10/10/2022 1249    Pulmonary Functions Testing Results:    Latest Ref Rng & Units 09/03/2022   11:36 AM  PFT Results  FVC-Pre L 1.56   FVC-Predicted Pre % 43   FVC-Post L 1.60   FVC-Predicted Post % 44   Pre FEV1/FVC % % 36   Post FEV1/FCV % % 36   FEV1-Pre L 0.57   FEV1-Predicted Pre % 20   FEV1-Post L 0.58   DLCO uncorrected ml/min/mmHg 6.46   DLCO UNC% % 29   DLVA Predicted % 33   TLC L 7.84   TLC % Predicted % 142   RV % Predicted % 263     Outpatient Medications Prior to Visit  Medication Sig Dispense Refill   aspirin EC 81 MG tablet Take 1 tablet (81 mg total) by mouth daily. Swallow whole. 90 tablet 0   atorvastatin (LIPITOR) 40 MG tablet Take 1 tablet (40 mg total) by mouth daily. 90 tablet 1   losartan (COZAAR) 25 MG tablet Take 1 tablet (25 mg total) by mouth daily. 90 tablet 1   meclizine (ANTIVERT) 25 MG tablet Take 1 tablet (25 mg total) by mouth 3 (three) times daily as needed for dizziness. 30 tablet 0   metoprolol succinate (TOPROL-XL) 25 MG 24 hr tablet Take 1 tablet (25 mg total) by mouth daily. 90 tablet 1   Multiple Vitamin (MULTIVITAMIN WITH MINERALS) TABS tablet Take 1 tablet by mouth daily. 90 tablet 1   PROVENTIL HFA 108 (90 Base) MCG/ACT inhaler Inhale 2 puffs into the lungs every 4 (four) hours as needed for shortness of breath or wheezing.     umeclidinium-vilanterol (ANORO ELLIPTA) 62.5-25 MCG/ACT AEPB Inhale 1 puff into the lungs daily. 30 each 11   No facility-administered medications  prior to visit.

## 2022-10-27 ENCOUNTER — Ambulatory Visit: Payer: Medicaid Other | Admitting: Student in an Organized Health Care Education/Training Program

## 2022-11-30 ENCOUNTER — Encounter: Payer: Self-pay | Admitting: Medical

## 2022-11-30 ENCOUNTER — Ambulatory Visit: Payer: Medicaid Other | Attending: Medical | Admitting: Medical

## 2022-11-30 VITALS — BP 161/77 | HR 76 | Ht 67.0 in | Wt 124.6 lb

## 2022-11-30 DIAGNOSIS — I2510 Atherosclerotic heart disease of native coronary artery without angina pectoris: Secondary | ICD-10-CM | POA: Diagnosis not present

## 2022-11-30 DIAGNOSIS — J9611 Chronic respiratory failure with hypoxia: Secondary | ICD-10-CM | POA: Diagnosis not present

## 2022-11-30 DIAGNOSIS — Z87891 Personal history of nicotine dependence: Secondary | ICD-10-CM | POA: Diagnosis not present

## 2022-11-30 DIAGNOSIS — Z789 Other specified health status: Secondary | ICD-10-CM

## 2022-11-30 DIAGNOSIS — I10 Essential (primary) hypertension: Secondary | ICD-10-CM | POA: Diagnosis not present

## 2022-11-30 DIAGNOSIS — J438 Other emphysema: Secondary | ICD-10-CM

## 2022-11-30 MED ORDER — ATORVASTATIN CALCIUM 40 MG PO TABS: 40.0000 mg | ORAL_TABLET | Freq: Every day | ORAL | 3 refills | Status: DC

## 2022-11-30 MED ORDER — LOSARTAN POTASSIUM 50 MG PO TABS: 50.0000 mg | ORAL_TABLET | Freq: Every day | ORAL | 3 refills | Status: DC

## 2022-11-30 NOTE — Progress Notes (Signed)
Cardiology Office Note:    Date:  11/30/2022   ID:  Katie Moody, DOB Oct 23, 1960, MRN [id number]  PCP:  [facility] Cardiologist:  None  [facility] Electrophysiologist:  None   Referring MD: [facility]   Chief Complaint: 1 month follow-up  History of Present Illness:    Katie Moody is a 62 y.o. female with a hx of HTN, alcohol use, emphysema, chronic hypoxic respiratory failure, COPD, mild nonobstructive CAD and tobacco use who is being seen for 1 month follow-up.   The patient was admitted to the hospital in March 2024 with sepsis due to an pneumonia, hyponatremia suspected 2/2 alcohol use, acute respiratory failure with hypoxia and hypercapnia found to have elevated troponin with non-STEMI as well as stress-induced cardiomyopathy.  Cardiac cath showed 50% RCA stenosis, likely stress-induced cardiomyopathy.  Echo showed preserved systolic function with moderate hypokinesis of the left ventricle mid apical and anterior septal wall.  Patient was started on guideline directed medical therapy. No chest pain was reported during the hospitalization.   The patient was last seen 07/2022 and reported persistent shortness of breath. She was referred to pulmonology.    ER visit 10/10/22 for dizziness and BP was found to be severely elevated. She was given Hydralazine in the ER. Losartan and Toprol were refilled.   The patient was seen 10/14/22 in the office and was unaware of what medications she was taking. The pharmacy was called and it was verified she was taking Losartan 25mg  daily and Toprol 25mg  daily.   Today,  the bp is still elevated. Bps at home with systolics 130-150s. She is overall feeling good. She is taking meds for vertigo. No chest pain, SOB, LLE, orthopnea or pnd. She is baseline O2 2-3L.    Past Medical History:  Diagnosis Date   Hypertension     Past Surgical History:  Procedure Laterality Date   ABDOMINAL  HYSTERECTOMY     LEFT HEART CATH AND CORONARY ANGIOGRAPHY N/A 06/04/2022   Procedure: LEFT HEART CATH AND CORONARY ANGIOGRAPHY;  Surgeon: Marykay Lex, MD;  Location: [facility] INVASIVE CV LAB;  Service: Cardiovascular;  Laterality: N/A;    Current Medications: Current Meds  Medication Sig   aspirin EC 81 MG tablet Take 1 tablet (81 mg total) by mouth daily. Swallow whole.   metoprolol succinate (TOPROL-XL) 25 MG 24 hr tablet Take 1 tablet (25 mg total) by mouth daily.   Multiple Vitamin (MULTIVITAMIN WITH MINERALS) TABS tablet Take 1 tablet by mouth daily.   PROVENTIL HFA 108 (90 Base) MCG/ACT inhaler Inhale 2 puffs into the lungs every 4 (four) hours as needed for shortness of breath or wheezing.   umeclidinium-vilanterol (ANORO ELLIPTA) 62.5-25 MCG/ACT AEPB Inhale 1 puff into the lungs daily.   [DISCONTINUED] atorvastatin (LIPITOR) 40 MG tablet Take 1 tablet (40 mg total) by mouth daily.   [DISCONTINUED] losartan (COZAAR) 25 MG tablet Take 1 tablet (25 mg total) by mouth daily.     Allergies:   Patient has no known allergies.   Social History   Socioeconomic History   Marital status: Single    Spouse name: Not on file   Number of children: 0   Years of education: Not on file   Highest education level: High school graduate  Occupational History   Not on file  Tobacco Use   Smoking status: Former    Current packs/day: 0.00    Average packs/day: 0.3 packs/day for 30.0 years (7.5  ttl pk-yrs)    Types: Cigarettes    Start date: 05/1992    Quit date: 05/2022    Years since quitting: 0.5   Smokeless tobacco: Never  Vaping Use   Vaping status: Never Used  Substance and Sexual Activity   Alcohol use: Yes    Comment: occasionally   Drug use: Never   Sexual activity: Not Currently    Birth control/protection: Post-menopausal  Other Topics Concern   Not on file  Social History Narrative   Not on file   Social Determinants of Health   Financial Resource Strain: Not on file   Food Insecurity: No Food Insecurity (02/09/2022)   Hunger Vital Sign    Worried About Running Out of Food in the Last Year: Never true    Ran Out of Food in the Last Year: Never true  Transportation Needs: No Transportation Needs (02/09/2022)   PRAPARE - Administrator, Civil Service (Medical): No    Lack of Transportation (Non-Medical): No  Physical Activity: Not on file  Stress: Not on file  Social Connections: Not on file     Family History: The patient's family history is not on file.  ROS:   Please see the history of present illness.     All other systems reviewed and are negative.  EKGs/Labs/Other Studies Reviewed:    The following studies were reviewed today:   LHC 06/04/22   Small, non-dominant RCA - Prox RCA lesion is 50% stenosed.   There is no aortic valve stenosis.   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 No LAD lesion to explain LAD regional wall motion abnormality. 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 and Not ACS    2D echo 06/01/2022: 1. Left ventricular ejection fraction, by estimation, is 55%. The left  ventricle has normal function. The left ventricle demonstrates regional  wall motion abnormalities (see scoring diagram/findings for description).  Left ventricular diastolic  parameters are consistent with Grade I diastolic dysfunction (impaired  relaxation). There is moderate hypokinesis of the left ventricular,  mid-apical anteroseptal wall.   2. Right ventricular systolic function is normal. The right ventricular  size is normal.   3. The mitral valve is normal in structure. No evidence of mitral valve  regurgitation.   4. The aortic valve is tricuspid. Aortic valve regurgitation is not  visualized.   EKG:  EKG is not ordered today.    Recent Labs: 05/31/2022: TSH 0.184 06/03/2022: Magnesium 2.1 10/10/2022: ALT 19; BUN 13; Creatinine, Ser 0.68;  Hemoglobin 13.4; Platelets 281; Potassium 4.0; Sodium 135  Recent Lipid Panel    Component Value Date/Time   CHOL 98 06/02/2022 0510   TRIG 39 06/02/2022 0510   HDL 51 06/02/2022 0510   CHOLHDL 1.9 06/02/2022 0510   VLDL 8 06/02/2022 0510   LDLCALC 39 06/02/2022 0510    Physical Exam:    VS:  BP (!) 161/77 (BP Location: Left Arm, Patient Position: Sitting, Cuff Size: Normal)   Pulse 76   Ht 5\' 7"  (1.702 m)   Wt 124 lb 9.6 oz (56.5 kg)   SpO2 98%   BMI 19.52 kg/m     Wt Readings from Last 3 Encounters:  11/30/22 124 lb 9.6 oz (56.5 kg)  10/26/22 118 lb 9.6 oz (53.8 kg)  10/14/22 116 lb 3.2 oz (52.7 kg)     GEN:  Well nourished, well developed in no acute distress HEENT: Normal NECK: No JVD; No  carotid bruits LYMPHATICS: No lymphadenopathy CARDIAC: RRR, no murmurs, rubs, gallops RESPIRATORY:  Clear to auscultation without rales, wheezing or rhonchi  ABDOMEN: Soft, non-tender, non-distended MUSCULOSKELETAL:  No edema; No deformity  SKIN: Warm and dry NEUROLOGIC:  Alert and oriented x 3 PSYCHIATRIC:  Normal affect   ASSESSMENT:    1. Coronary artery disease involving native coronary artery of native heart without angina pectoris   2. Essential hypertension   3. History of tobacco use   4. Chronic respiratory failure with hypoxia (HCC)   5. Other emphysema (HCC)   6. Alcohol use    PLAN:    In order of problems listed above:  Mild nonobstructive CAD Patient denies chest pain or worsening shortness of breath. Echo showed normal LVEF. Continue Aspirin, Lipitor, Losartan and Toprol. No further ischemic work-up at this time.   HTN BP is still elevated today. It seems to be better at home. I will increase Losartan to 50mg  daily. Continue Toprol 25mg  daily.   H/o tobacco use Chronic respiratory failure Emphysema Patient is on baseline 2-3LO2. She follows with pulmonology.   Alcohol use She drinks 3 beers weekly.  Disposition: Follow up in 3 month(s) with MD/APP    Signed, Oswell Say David Stall, PA-C  11/30/2022 3:05 PM    [facility]

## 2022-11-30 NOTE — Patient Instructions (Signed)
Medication Instructions:  Your physician recommends the following medication changes.  INCREASE: Losartan 50 mg by mouth daily   *If you need a refill on your cardiac medications before your next appointment, please call your pharmacy*   Lab Work: No labs ordered today    Testing/Procedures: No test ordered today    Follow-Up: At [facility], you and your health needs are our priority.  As part of our continuing mission to provide you with exceptional heart care, we have created designated Provider Care Teams.  These Care Teams include your primary Cardiologist (physician) and Advanced Practice Providers (APPs -  Physician Assistants and Nurse Practitioners) who all work together to provide you with the care you need, when you need it.  We recommend signing up for the patient portal called "MyChart".  Sign up information is provided on this After Visit Summary.  MyChart is used to connect with patients for Virtual Visits (Telemedicine).  Patients are able to view lab/test results, encounter notes, upcoming appointments, etc.  Non-urgent messages can be sent to your provider as well.   To learn more about what you can do with MyChart, go to [email or web address].    Your next appointment:   3 month(s)  Provider:   Terrilee Croak, PA-C

## 2022-12-23 ENCOUNTER — Telehealth: Payer: Self-pay | Admitting: Cardiology

## 2022-12-23 MED ORDER — LOSARTAN POTASSIUM 50 MG PO TABS: 50.0000 mg | ORAL_TABLET | Freq: Every day | ORAL | 3 refills | Status: DC

## 2022-12-23 NOTE — Telephone Encounter (Signed)
*  STAT* If patient is at the pharmacy, call can be transferred to refill team.   1. Which medications need to be refilled? (please list name of each medication and dose if known)   losartan (COZAAR) 50 MG tablet    2. Which pharmacy/location (including street and city if local pharmacy) is medication to be sent to? [id number] - [place], [place] - [address] AT [facility] OF [address]   3. Do they need a 30 day or 90 day supply? 90

## 2022-12-23 NOTE — Telephone Encounter (Signed)
Pt's medication was sent to pt's pharmacy as requested. Confirmation received.  °

## 2023-02-06 ENCOUNTER — Inpatient Hospital Stay
Admission: EM | Admit: 2023-02-06 | Discharge: 2023-03-07 | DRG: 853 | Disposition: E | Payer: Medicaid Other | Attending: Internal Medicine | Admitting: Internal Medicine

## 2023-02-06 ENCOUNTER — Emergency Department: Payer: Medicaid Other

## 2023-02-06 ENCOUNTER — Other Ambulatory Visit: Payer: Self-pay

## 2023-02-06 DIAGNOSIS — D62 Acute posthemorrhagic anemia: Secondary | ICD-10-CM | POA: Diagnosis not present

## 2023-02-06 DIAGNOSIS — I252 Old myocardial infarction: Secondary | ICD-10-CM

## 2023-02-06 DIAGNOSIS — I10 Essential (primary) hypertension: Secondary | ICD-10-CM | POA: Diagnosis present

## 2023-02-06 DIAGNOSIS — A419 Sepsis, unspecified organism: Principal | ICD-10-CM | POA: Diagnosis present

## 2023-02-06 DIAGNOSIS — K6389 Other specified diseases of intestine: Secondary | ICD-10-CM | POA: Diagnosis not present

## 2023-02-06 DIAGNOSIS — I472 Ventricular tachycardia, unspecified: Secondary | ICD-10-CM | POA: Diagnosis not present

## 2023-02-06 DIAGNOSIS — R140 Abdominal distension (gaseous): Secondary | ICD-10-CM | POA: Diagnosis not present

## 2023-02-06 DIAGNOSIS — K5720 Diverticulitis of large intestine with perforation and abscess without bleeding: Secondary | ICD-10-CM | POA: Diagnosis present

## 2023-02-06 DIAGNOSIS — E860 Dehydration: Secondary | ICD-10-CM | POA: Diagnosis present

## 2023-02-06 DIAGNOSIS — J449 Chronic obstructive pulmonary disease, unspecified: Secondary | ICD-10-CM | POA: Diagnosis present

## 2023-02-06 DIAGNOSIS — G9341 Metabolic encephalopathy: Secondary | ICD-10-CM | POA: Diagnosis present

## 2023-02-06 DIAGNOSIS — Z79899 Other long term (current) drug therapy: Secondary | ICD-10-CM

## 2023-02-06 DIAGNOSIS — J9602 Acute respiratory failure with hypercapnia: Secondary | ICD-10-CM | POA: Diagnosis present

## 2023-02-06 DIAGNOSIS — E278 Other specified disorders of adrenal gland: Secondary | ICD-10-CM | POA: Diagnosis present

## 2023-02-06 DIAGNOSIS — Z7951 Long term (current) use of inhaled steroids: Secondary | ICD-10-CM

## 2023-02-06 DIAGNOSIS — I700 Atherosclerosis of aorta: Secondary | ICD-10-CM | POA: Diagnosis present

## 2023-02-06 DIAGNOSIS — N170 Acute kidney failure with tubular necrosis: Secondary | ICD-10-CM | POA: Diagnosis present

## 2023-02-06 DIAGNOSIS — E279 Disorder of adrenal gland, unspecified: Secondary | ICD-10-CM | POA: Diagnosis present

## 2023-02-06 DIAGNOSIS — Z823 Family history of stroke: Secondary | ICD-10-CM

## 2023-02-06 DIAGNOSIS — E785 Hyperlipidemia, unspecified: Secondary | ICD-10-CM | POA: Diagnosis present

## 2023-02-06 DIAGNOSIS — K56699 Other intestinal obstruction unspecified as to partial versus complete obstruction: Secondary | ICD-10-CM | POA: Diagnosis present

## 2023-02-06 DIAGNOSIS — E871 Hypo-osmolality and hyponatremia: Secondary | ICD-10-CM | POA: Diagnosis present

## 2023-02-06 DIAGNOSIS — I5032 Chronic diastolic (congestive) heart failure: Secondary | ICD-10-CM | POA: Diagnosis present

## 2023-02-06 DIAGNOSIS — R739 Hyperglycemia, unspecified: Secondary | ICD-10-CM | POA: Diagnosis not present

## 2023-02-06 DIAGNOSIS — N179 Acute kidney failure, unspecified: Secondary | ICD-10-CM | POA: Diagnosis not present

## 2023-02-06 DIAGNOSIS — Z515 Encounter for palliative care: Secondary | ICD-10-CM | POA: Diagnosis not present

## 2023-02-06 DIAGNOSIS — Z87891 Personal history of nicotine dependence: Secondary | ICD-10-CM

## 2023-02-06 DIAGNOSIS — R7303 Prediabetes: Secondary | ICD-10-CM | POA: Diagnosis present

## 2023-02-06 DIAGNOSIS — Z8249 Family history of ischemic heart disease and other diseases of the circulatory system: Secondary | ICD-10-CM

## 2023-02-06 DIAGNOSIS — I110 Hypertensive heart disease with heart failure: Secondary | ICD-10-CM | POA: Diagnosis present

## 2023-02-06 DIAGNOSIS — E8809 Other disorders of plasma-protein metabolism, not elsewhere classified: Secondary | ICD-10-CM | POA: Diagnosis present

## 2023-02-06 DIAGNOSIS — Z66 Do not resuscitate: Secondary | ICD-10-CM | POA: Diagnosis present

## 2023-02-06 DIAGNOSIS — Z7982 Long term (current) use of aspirin: Secondary | ICD-10-CM

## 2023-02-06 DIAGNOSIS — E875 Hyperkalemia: Secondary | ICD-10-CM | POA: Diagnosis not present

## 2023-02-06 DIAGNOSIS — E8721 Acute metabolic acidosis: Secondary | ICD-10-CM | POA: Diagnosis present

## 2023-02-06 DIAGNOSIS — K56609 Unspecified intestinal obstruction, unspecified as to partial versus complete obstruction: Secondary | ICD-10-CM | POA: Diagnosis not present

## 2023-02-06 DIAGNOSIS — K567 Ileus, unspecified: Secondary | ICD-10-CM | POA: Diagnosis not present

## 2023-02-06 DIAGNOSIS — I2510 Atherosclerotic heart disease of native coronary artery without angina pectoris: Secondary | ICD-10-CM | POA: Diagnosis not present

## 2023-02-06 DIAGNOSIS — Z90710 Acquired absence of both cervix and uterus: Secondary | ICD-10-CM

## 2023-02-06 DIAGNOSIS — E876 Hypokalemia: Secondary | ICD-10-CM | POA: Diagnosis present

## 2023-02-06 DIAGNOSIS — R6521 Severe sepsis with septic shock: Secondary | ICD-10-CM | POA: Diagnosis present

## 2023-02-06 DIAGNOSIS — K624 Stenosis of anus and rectum: Secondary | ICD-10-CM

## 2023-02-06 HISTORY — DX: Chronic diastolic (congestive) heart failure: I50.32

## 2023-02-06 HISTORY — DX: Chronic obstructive pulmonary disease, unspecified: J44.9

## 2023-02-06 HISTORY — DX: Atherosclerotic heart disease of native coronary artery without angina pectoris: I25.10

## 2023-02-06 LAB — CBC
HCT: 40.6 % (ref 36.0–46.0)
Hemoglobin: 13.5 g/dL (ref 12.0–15.0)
MCH: 30.5 pg (ref 26.0–34.0)
MCHC: 33.3 g/dL (ref 30.0–36.0)
MCV: 91.6 fL (ref 80.0–100.0)
Platelets: 377 10*3/uL (ref 150–400)
RBC: 4.43 MIL/uL (ref 3.87–5.11)
RDW: 12.1 % (ref 11.5–15.5)
WBC: 9.9 10*3/uL (ref 4.0–10.5)
nRBC: 0 % (ref 0.0–0.2)

## 2023-02-06 LAB — COMPREHENSIVE METABOLIC PANEL
ALT: 14 U/L (ref 0–44)
AST: 33 U/L (ref 15–41)
Albumin: 3.9 g/dL (ref 3.5–5.0)
Alkaline Phosphatase: 65 U/L (ref 38–126)
Anion gap: 17 — ABNORMAL HIGH (ref 5–15)
BUN: 87 mg/dL — ABNORMAL HIGH (ref 8–23)
CO2: 28 mmol/L (ref 22–32)
Calcium: 8.2 mg/dL — ABNORMAL LOW (ref 8.9–10.3)
Chloride: 89 mmol/L — ABNORMAL LOW (ref 98–111)
Creatinine, Ser: 2.18 mg/dL — ABNORMAL HIGH (ref 0.44–1.00)
GFR, Estimated: 25 mL/min — ABNORMAL LOW (ref >60)
Glucose, Bld: 131 mg/dL — ABNORMAL HIGH (ref 70–99)
Potassium: 3.3 mmol/L — ABNORMAL LOW (ref 3.5–5.1)
Sodium: 134 mmol/L — ABNORMAL LOW (ref 135–145)
Total Bilirubin: 3.1 mg/dL — ABNORMAL HIGH (ref 0.3–1.2)
Total Protein: 7.8 g/dL (ref 6.5–8.1)

## 2023-02-06 LAB — URINALYSIS, ROUTINE W REFLEX MICROSCOPIC
Bilirubin Urine: NEGATIVE
Glucose, UA: NEGATIVE mg/dL
Hgb urine dipstick: NEGATIVE
Ketones, ur: NEGATIVE mg/dL
Leukocytes,Ua: NEGATIVE
Nitrite: NEGATIVE
Protein, ur: NEGATIVE mg/dL
Specific Gravity, Urine: 1.017 (ref 1.005–1.030)
pH: 5 (ref 5.0–8.0)

## 2023-02-06 LAB — LIPASE, BLOOD: Lipase: 28 U/L (ref 11–51)

## 2023-02-06 LAB — LACTIC ACID, PLASMA: Lactic Acid, Venous: 3.1 mmol/L (ref 0.5–1.9)

## 2023-02-06 LAB — MAGNESIUM: Magnesium: 2.9 mg/dL — ABNORMAL HIGH (ref 1.7–2.4)

## 2023-02-06 MED ORDER — SODIUM CHLORIDE 0.9 % IV BOLUS
1000.0000 mL | Freq: Once | INTRAVENOUS | Status: AC
  Administered 2023-02-06: 1000 mL via INTRAVENOUS

## 2023-02-06 MED ORDER — POTASSIUM CHLORIDE 10 MEQ/100ML IV SOLN
10.0000 meq | INTRAVENOUS | Status: AC
  Administered 2023-02-07 (×3): 10 meq via INTRAVENOUS
  Filled 2023-02-06 (×3): qty 100

## 2023-02-06 MED ORDER — PIPERACILLIN-TAZOBACTAM 3.375 G IVPB 30 MIN
3.3750 g | Freq: Once | INTRAVENOUS | Status: AC
  Administered 2023-02-06: 3.375 g via INTRAVENOUS
  Filled 2023-02-06: qty 50

## 2023-02-06 MED ORDER — HYDRALAZINE HCL 20 MG/ML IJ SOLN: 5.0000 mg | INTRAMUSCULAR | Status: DC | PRN

## 2023-02-06 MED ORDER — PIPERACILLIN-TAZOBACTAM 3.375 G IVPB
3.3750 g | Freq: Three times a day (TID) | INTRAVENOUS | Status: AC
  Administered 2023-02-07 – 2023-02-08 (×4): 3.375 g via INTRAVENOUS
  Filled 2023-02-06 (×4): qty 50

## 2023-02-06 MED ORDER — MORPHINE SULFATE (PF) 2 MG/ML IV SOLN: 2.0000 mg | INTRAVENOUS | Status: DC | PRN

## 2023-02-06 MED ORDER — LACTATED RINGERS IV BOLUS
1000.0000 mL | Freq: Once | INTRAVENOUS | Status: AC
  Administered 2023-02-06: 1000 mL via INTRAVENOUS

## 2023-02-06 MED ORDER — OXYCODONE-ACETAMINOPHEN 5-325 MG PO TABS
1.0000 | ORAL_TABLET | ORAL | Status: DC | PRN
  Administered 2023-02-07: 1 via ORAL
  Filled 2023-02-06: qty 1

## 2023-02-06 MED ORDER — ONDANSETRON HCL 4 MG/2ML IJ SOLN: 4.0000 mg | Freq: Three times a day (TID) | INTRAMUSCULAR | Status: DC | PRN

## 2023-02-06 MED ORDER — ALBUTEROL SULFATE (2.5 MG/3ML) 0.083% IN NEBU: 2.5000 mg | INHALATION_SOLUTION | RESPIRATORY_TRACT | Status: DC | PRN

## 2023-02-06 MED ORDER — ACETAMINOPHEN 650 MG RE SUPP: 650.0000 mg | Freq: Four times a day (QID) | RECTAL | Status: DC | PRN

## 2023-02-06 MED ORDER — SODIUM CHLORIDE 0.9 % IV SOLN: INTRAVENOUS | Status: DC

## 2023-02-06 MED ORDER — ALBUTEROL SULFATE HFA 108 (90 BASE) MCG/ACT IN AERS: 2.0000 | INHALATION_SPRAY | RESPIRATORY_TRACT | Status: DC | PRN

## 2023-02-06 NOTE — ED Triage Notes (Signed)
Pt comes via EMs from home with c/o belly pain. Pt stats all across it . Pt states distended for about week. Pt denies BM in week. Pt state nausea.   VSS  Pt wear 3L [place]

## 2023-02-06 NOTE — ED Notes (Signed)
Secure Chat Lang Snow, MD to notify of critical Lactic Acid of 3.1

## 2023-02-06 NOTE — H&P (Incomplete)
History and Physical    Katie Moody WUJ:[id number] DOB: 1960/05/27 DOA: 02/11/2023  Referring MD/NP/PA:   PCP: [facility]   Patient coming from:  The patient is coming from home.     Chief Complaint: Abdominal pain  HPI: Katie Moody is a 62 y.o. female with medical history significant of hypertension, hyperlipidemia, CAD, diastolic CHF, who presents with abdominal pain.  Patient states that she has abdominal pain for almost a week.  The abdominal pain is diffuse, constant, moderate, sharp, nonradiating.  Abdominal is distended.  Associated with nausea and several episode of nonbilious nonbloody vomiting.  Patient reports several episodes of loose stool bowel movement.  No fever or chills.  Patient does not have chest pain, cough, SOB.  Denies symptoms of UTI.  Initial blood pressure was 86/26, which improved to 114/72 after giving 2 to 3 L IV fluid bolus in ED.  Data reviewed independently and ED Course: pt was found to have WBC 9.9, potassium 3.3, lipase 28, AKI with creatinine 2.18, BUN 87, GFR 25 (recent baseline creatinine 0.68 10/10/2022), negative urinalysis, lactic acid 3.1 --> 2.7, heart rate 117 --> 92, RR 24 --> 17, oxygen saturation 97%, temperature normal.  Patient is admitted to PCU as inpatient.  Dr. Claudine Mouton of surgery is consulted.   CT of abdomen/pelvis: 1. 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 Clinical correlation is recommended. No portal venous gas or pneumoperitoneum. 2. Mildly dilated small bowel loops, likely reactive ileus. 3. Sigmoid diverticulosis. Normal appendix. 4.  Aortic Atherosclerosis (ICD10-I70.0).    EKG: I have personally reviewed.  Sinus rhythm, QTc 483, low voltage, poor R wave progression, anteroseptal infarction pattern   Review of Systems:   General: no fevers, chills, no body weight gain, has poor appetite, has fatigue HEENT: no blurry vision, hearing changes or sore  throat Respiratory: no dyspnea, coughing, wheezing CV: no chest pain, no palpitations GI: has nausea, vomiting, abdominal pain, diarrhea, no constipation GU: no dysuria, burning on urination, increased urinary frequency, hematuria  Ext: no leg edema Neuro: no unilateral weakness, numbness, or tingling, no vision change or hearing loss Skin: no rash, no skin tear. MSK: No muscle spasm, no deformity, no limitation of range of movement in spin Heme: No easy bruising.  Travel history: No recent long distant travel.   Allergy: No Known Allergies  Past Medical History:  Diagnosis Date   CAD (coronary artery disease)    Chronic diastolic CHF (congestive heart failure) (HCC)    COPD (chronic obstructive pulmonary disease) (HCC)    Hypertension     Past Surgical History:  Procedure Laterality Date   ABDOMINAL HYSTERECTOMY     LEFT HEART CATH AND CORONARY ANGIOGRAPHY N/A 06/04/2022   Procedure: LEFT HEART CATH AND CORONARY ANGIOGRAPHY;  Surgeon: Marykay Lex, MD;  Location: [facility] INVASIVE CV LAB;  Service: Cardiovascular;  Laterality: N/A;    Social History:  reports that she quit smoking about 9 months ago. Her smoking use included cigarettes. She started smoking about 30 years ago. She has a 7.5 pack-year smoking history. She has never used smokeless tobacco. She reports current alcohol use. She reports that she does not use drugs.  Family History:  Family History  Problem Relation Age of Onset   Heart attack Mother    Stroke Mother      Prior to Admission medications   Medication Sig Start Date End Date Taking? Authorizing Provider  aspirin EC 81 MG tablet Take 1 tablet (81  mg total) by mouth daily. Swallow whole. 07/20/22   Furth, Cadence H, PA-C  atorvastatin (LIPITOR) 40 MG tablet Take 1 tablet (40 mg total) by mouth daily. 11/30/22   Furth, Cadence H, PA-C  losartan (COZAAR) 50 MG tablet Take 1 tablet (50 mg total) by mouth daily. 12/23/22   Furth, Cadence H, PA-C  meclizine  (ANTIVERT) 25 MG tablet Take 1 tablet (25 mg total) by mouth 3 (three) times daily as needed for dizziness. Patient not taking: Reported on 11/30/2022 08/23/21   Cuthriell, Delorise Royals, PA-C  metoprolol succinate (TOPROL-XL) 25 MG 24 hr tablet Take 1 tablet (25 mg total) by mouth daily. 06/09/22   Arnetha Courser, MD  Multiple Vitamin (MULTIVITAMIN WITH MINERALS) TABS tablet Take 1 tablet by mouth daily. 06/09/22   Arnetha Courser, MD  PROVENTIL HFA [phone] Base) MCG/ACT inhaler Inhale 2 puffs into the lungs every 4 (four) hours as needed for shortness of breath or wheezing. 12/22/21   [provider]  umeclidinium-vilanterol (ANORO ELLIPTA) 62.5-25 MCG/ACT AEPB Inhale 1 puff into the lungs daily. 08/04/22   [name], MD    Physical Exam: Vitals:   03/03/2023 2305 02/07/23 0030 02/07/23 0100 02/07/23 0115  BP:  102/66 104/66 98/65  Pulse: 92 96 95 97  Resp: 17 20 17 20   Temp:      TempSrc:      SpO2: 100% 96% 94% 91%   General: Not in acute distress HEENT:       Eyes: PERRL, EOMI, no jaundice       ENT: No discharge from the ears and nose, no pharynx injection, no tonsillar enlargement.        Neck: No JVD, no bruit, no mass felt. Heme: No neck lymph node enlargement. Cardiac: S1/S2, RRR, No murmurs, No gallops or rubs. Respiratory: No rales, wheezing, rhonchi or rubs. GI: distended, has diffuse tenderness, no rebound pain, no organomegaly, BS present. GU: No hematuria Ext: No pitting leg edema bilaterally. 1+DP/PT pulse bilaterally. Musculoskeletal: No joint deformities, No joint redness or warmth, no limitation of ROM in spin. Skin: No rashes.  Neuro: Alert, oriented X3, cranial nerves II-XII grossly intact, moves all extremities normally.  Psych: Patient is not psychotic, no suicidal or hemocidal ideation.  Labs on Admission: I have personally reviewed following labs and imaging studies  CBC: Recent Labs  Lab 03/02/2023 1746  WBC 9.9  HGB 13.5  HCT 40.6  MCV 91.6  PLT 377    Basic Metabolic Panel: Recent Labs  Lab 02/17/2023 1746  NA 134*  K 3.3*  CL 89*  CO2 28  GLUCOSE 131*  BUN 87*  CREATININE 2.18*  CALCIUM 8.2*  MG 2.9*   GFR: CrCl cannot be calculated (Unknown ideal weight.). Liver Function Tests: Recent Labs  Lab 02/15/2023 1746  AST 33  ALT 14  ALKPHOS 65  BILITOT 3.1*  PROT 7.8  ALBUMIN 3.9   Recent Labs  Lab 02/24/2023 1746  LIPASE 28   No results for input(s): "AMMONIA" in the last 168 hours. Coagulation Profile: No results for input(s): "INR", "PROTIME" in the last 168 hours. Cardiac Enzymes: No results for input(s): "CKTOTAL", "CKMB", "CKMBINDEX", "TROPONINI" in the last 168 hours. BNP (last 3 results) No results for input(s): "PROBNP" in the last 8760 hours. HbA1C: No results for input(s): "HGBA1C" in the last 72 hours. CBG: No results for input(s): "GLUCAP" in the last 168 hours. Lipid Profile: No results for input(s): "CHOL", "HDL", "LDLCALC", "TRIG", "CHOLHDL", "LDLDIRECT" in the last 72  hours. Thyroid Function Tests: No results for input(s): "TSH", "T4TOTAL", "FREET4", "T3FREE", "THYROIDAB" in the last 72 hours. Anemia Panel: No results for input(s): "VITAMINB12", "FOLATE", "FERRITIN", "TIBC", "IRON", "RETICCTPCT" in the last 72 hours. Urine analysis:    Component Value Date/Time   COLORURINE AMBER (A) 02/22/2023 1746   APPEARANCEUR CLOUDY (A) 02/11/2023 1746   LABSPEC 1.017 02/16/2023 1746   PHURINE 5.0 03/06/2023 1746   GLUCOSEU NEGATIVE 02/07/2023 1746   HGBUR NEGATIVE 02/19/2023 1746   BILIRUBINUR NEGATIVE 02/26/2023 1746   KETONESUR NEGATIVE 03/05/2023 1746   PROTEINUR NEGATIVE 02/05/2023 1746   NITRITE NEGATIVE 02/20/2023 1746   LEUKOCYTESUR NEGATIVE 02/27/2023 1746   Sepsis Labs: @LABRCNTIP (procalcitonin:4,lacticidven:4) )No results found for this or any previous visit (from the past 240 hour(s)).   Radiological Exams on Admission: DG Abdomen 1 View  Result Date: 02/24/2023 CLINICAL DATA:  NG  tube placement EXAM: ABDOMEN - 1 VIEW COMPARISON:  None Available. FINDINGS: NG tube is in the mid stomach. Dilated bowel in the upper abdomen. Lung bases clear. IMPRESSION: NG tube in the mid stomach. Electronically Signed   By: Charlett Nose M.D.   On: 02/24/2023 23:21   CT ABDOMEN PELVIS WO CONTRAST  Result Date: 03/06/2023 CLINICAL DATA:  Sepsis. EXAM: CT ABDOMEN AND PELVIS WITHOUT CONTRAST TECHNIQUE: Multidetector CT imaging of the abdomen and pelvis was performed following the standard protocol without IV contrast. RADIATION DOSE REDUCTION: This exam was performed according to the departmental dose-optimization program which includes automated exposure control, adjustment of the mA and/or kV according to patient size and/or use of iterative reconstruction technique. COMPARISON:  None Available. FINDINGS: Lower chest: The visualized lung bases are clear. There is coronary vascular calcification. No intra-abdominal free air or free fluid. Hepatobiliary: The liver is unremarkable. No biliary dilatation. The gallbladder is contracted. No calcified gallstone. Pancreas: Unremarkable. No pancreatic ductal dilatation or surrounding inflammatory changes. Spleen: Normal in size without focal abnormality. Adrenals/Urinary Tract: The left adrenal glands unremarkable. There is a 12 mm indeterminate right adrenal nodule. There is no hydronephrosis or nephrolithiasis on either side. The visualized ureters appear unremarkable. The urinary bladder is collapsed. Stomach/Bowel: There is mild thickened appearance of the wall of the cecum and ascending colon with intramural gas concerning for pneumatosis and suggestive of a necrotizing inflammatory process or typhlitis. Clinical correlation is recommended. Several mildly dilated loops of small bowel measure up to 3.5 cm in caliber, likely reactive ileus. No discrete transition noted to suggest obstruction. There is sigmoid diverticulosis and several scattered colonic  diverticula. The appendix is normal. Vascular/Lymphatic: Advanced aortoiliac atherosclerotic disease. The IVC is unremarkable. No portal venous gas. There is no adenopathy. Reproductive: Hysterectomy.  No suspicious adnexal masses. Other: None Musculoskeletal: Disc desiccation and vacuum phenomena at L5-S1. No acute osseous pathology. IMPRESSION: 1. 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 Clinical correlation is recommended. No portal venous gas or pneumoperitoneum. 2. Mildly dilated small bowel loops, likely reactive ileus. 3. Sigmoid diverticulosis. Normal appendix. 4.  Aortic Atherosclerosis (ICD10-I70.0). These results were called by telephone at the time of interpretation on 02/25/2023 at 10:09 pm to provider PHILLIP STAFFORD , who verbally acknowledged these results. Electronically Signed   By: Elgie Collard M.D.   On: 02/11/2023 22:12      Assessment/Plan Principal Problem:   Pneumatosis of intestines Active Problems:   CAD (coronary artery disease)   AKI (acute kidney injury) (HCC)   Chronic obstructive pulmonary disease (HCC)   Chronic diastolic CHF (congestive heart failure) (HCC)  Hypertension   Hypokalemia   HLD (hyperlipidemia)   Adrenal nodule (HCC)   Assessment and Plan:  Pneumatosis of intestines: CT showed pneumatosis of the cecum and ascending colon concerning for typhlitis. Pt is not immunosuppressed.  Patient has elevated lactic acid 3.1 --> 2.7, but no fever or leukocytosis.  Clinically does not seem to have sepsis. Consulted Dr. Claudine Mouton of surgery  -Admitted to PCU as inpatient -NG tube placed  -Empiric antibiotics: Zosyn and variconazole -Blood culture -IV fluid: 1 L LR, 2 L normal saline, then 100 cc/h of NS -As needed morphine, Percocet, Tylenol for pain -As needed Zofran for nausea and vomiting -Check C. difficile  CAD (coronary artery disease) -Aspirin, Lipitor  AKI (acute kidney injury) (HCC): No hydronephrosis.  Likely  due to dehydration and continuation of Cozaar. -IV fluid as above -Hold Cozaar  Chronic obstructive pulmonary disease (HCC): Stable -Continue bronchodilators  Chronic diastolic congestive heart failure: 2D echo on 2/2 6/24 showed EF 55% with grade 1 diastolic dysfunction.  No leg edema JVD.  BNP 116.  CHF seem to be compensated. -Watch volume status closely  Hypertension -Hold Cozaar and metoprolol due to hypotension -IV hydralazine as needed  Hypokalemia: Potassium 3.3 -Repeat treated potassium -Check magnesium level  HLD (hyperlipidemia) -Lipitor  Adrenal nodule (HCC): This is incidental findings by CT scan -Follow-up with PCP as outpatient workup     DVT ppx: SCd  Code Status: Full code     Family Communication: not done, no family member is at bed side.        Disposition Plan:  Anticipate discharge back to previous environment  Consults called:   Dr. Claudine Mouton of surgery is consulted.  Admission status and Level of care: Progressive:    as inpt      Dispo: The patient is from: Home              Anticipated d/c is to: Home              Anticipated d/c date is: 2 days              Patient currently is not medically stable to d/c.    Severity of Illness:  The appropriate patient status for this patient is INPATIENT. Inpatient status is judged to be reasonable and necessary in order to provide the required intensity of service to ensure the patient's safety. The patient's presenting symptoms, physical exam findings, and initial radiographic and laboratory data in the context of their chronic comorbidities is felt to place them at high risk for further clinical deterioration. Furthermore, it is not anticipated that the patient will be medically stable for discharge from the hospital within 2 midnights of admission.   * I certify that at the point of admission it is my clinical judgment that the patient will require inpatient hospital care spanning beyond 2 midnights  from the point of admission due to high intensity of service, high risk for further deterioration and high frequency of surveillance required.*       Date of Service 02/07/2023    Lorretta Harp [facility]   If 7PM-7AM, please contact night-coverage [email or web address] 02/07/2023, 1:33 AM

## 2023-02-06 NOTE — ED Provider Notes (Signed)
[facility] Provider Note    Event Date/Time   First MD Initiated Contact with Patient 02/05/2023 2215     (approximate)   History   Chief Complaint: abdominal pain  HPI  Katie Moody is a 62 y.o. female with a history of hypertension who comes ED complaining of generalized abdominal pain for the past week, gradual onset and worsening.  Started having nausea and vomiting today.  Had a loose bowel movement yesterday.  Denies fever.  No chest pain or shortness of breath.     Physical Exam   Triage Vital Signs: ED Triage Vitals  Encounter Vitals Group     BP 03/04/2023 1744 93/81     Systolic BP Percentile --      Diastolic BP Percentile --      Pulse Rate 02/12/2023 1744 72     Resp 02/27/2023 1744 20     Temp 03/02/2023 1744 (!) 97.5 F (36.4 C)     Temp Source 02/15/2023 1744 Oral     SpO2 02/13/2023 1744 99 %     Weight --      Height --      Head Circumference --      Peak Flow --      Pain Score 02/15/2023 1740 10     Pain Loc --      Pain Education --      Exclude from Growth Chart --     Most recent vital signs: Vitals:   02/17/2023 2300 03/04/2023 2305  BP: 114/72   Pulse: 98 92  Resp: (!) 24 17  Temp:    SpO2:  100%    General: Awake, no distress.  CV:  Good peripheral perfusion.  Tachycardia heart rate 100 Resp:  Normal effort.  Clear to auscultation bilaterally Abd:  Markedly distended with tympany to percussion.  There is right sided tenderness, no rigidity, no rebound. Other:  Dry oral mucosa   ED Results / Procedures / Treatments   Labs (all labs ordered are listed, but only abnormal results are displayed) Labs Reviewed  COMPREHENSIVE METABOLIC PANEL - Abnormal; Notable for the following components:      Result Value   Sodium 134 (*)    Potassium 3.3 (*)    Chloride 89 (*)    Glucose, Bld 131 (*)    BUN 87 (*)    Creatinine, Ser 2.18 (*)    Calcium 8.2 (*)    Total Bilirubin 3.1 (*)    GFR, Estimated 25 (*)    Anion gap  17 (*)    All other components within normal limits  URINALYSIS, ROUTINE W REFLEX MICROSCOPIC - Abnormal; Notable for the following components:   Color, Urine AMBER (*)    APPearance CLOUDY (*)    All other components within normal limits  LACTIC ACID, PLASMA - Abnormal; Notable for the following components:   Lactic Acid, Venous 3.1 (*)    All other components within normal limits  CULTURE, BLOOD (ROUTINE X 2)  CULTURE, BLOOD (ROUTINE X 2)  LIPASE, BLOOD  CBC  LACTIC ACID, PLASMA     EKG Interpreted by me Sinus tachycardia rate 119.  Normal axis intervals.  Poor R wave progression.  Normal ST segments and T waves.   RADIOLOGY CT abdomen pelvis interpreted by me, shows diffuse distention of large and small bowel.  Distal sigmoid colon is decompressed.  No free air.  Discussed with radiology.   PROCEDURES:  .Critical Care  Performed by: Scotty Court,  Aneta Mins, MD Authorized by: Sharman Cheek, MD   Critical care provider statement:    Critical care time (minutes):  35   Critical care time was exclusive of:  Separately billable procedures and treating other patients   Critical care was necessary to treat or prevent imminent or life-threatening deterioration of the following conditions:  Sepsis and shock   Critical care was time spent personally by me on the following activities:  Development of treatment plan with patient or surrogate, discussions with consultants, evaluation of patient's response to treatment, examination of patient, obtaining history from patient or surrogate, ordering and performing treatments and interventions, ordering and review of laboratory studies, ordering and review of radiographic studies, pulse oximetry, re-evaluation of patient's condition and review of old charts   Care discussed with: admitting provider      MEDICATIONS ORDERED IN ED: Medications  lactated ringers bolus 1,000 mL (has no administration in time range)  sodium chloride 0.9 % bolus  1,000 mL (0 mLs Intravenous Stopped 03/02/2023 2328)  sodium chloride 0.9 % bolus 1,000 mL (0 mLs Intravenous Stopped 02/17/2023 2238)  piperacillin-tazobactam (ZOSYN) IVPB 3.375 g (0 g Intravenous Stopped 03/02/2023 2215)     IMPRESSION / MDM / ASSESSMENT AND PLAN / ED COURSE  I reviewed the triage vital signs and the nursing notes.  DDx: Bowel obstruction, bowel perforation, intra-abdominal abscess, volvulus, colitis, sepsis  Patient's presentation is most consistent with acute presentation with potential threat to life or bodily function.  Patient presents with abdominal pain and distention, tachycardia hypotension.  Appears dehydrated.  Will check labs, 2 L fluid bolus.  Zosyn.  CT abdomen pelvis obtained, concerning for pneumatosis of ascending colon along with extensive intestinal dilatation, thought to be ileus per radiology..   Clinical Course as of 02/27/2023 2330  Sat Feb 06, 2023  2224 Discussed with general surgery who recommends continued resuscitation with IV fluids, antibiotics overnight, reassess in the morning. Will contact hospitalist for admission [PS]    Clinical Course User Index [PS] Sharman Cheek, MD    ----------------------------------------- 11:30 PM on 02/24/2023 ----------------------------------------- Blood pressure normalized after IV fluids.  Sepsis reassessment completed.  Vasopressors not indicated.  Discussed with hospitalist for further management.   FINAL CLINICAL IMPRESSION(S) / ED DIAGNOSES   Final diagnoses:  Pneumatosis coli     Rx / DC Orders   ED Discharge Orders     None        Note:  This document was prepared using Dragon voice recognition software and may include unintentional dictation errors.   Sharman Cheek, MD 03/02/2023 2330

## 2023-02-06 NOTE — Progress Notes (Signed)
Pharmacy Antibiotic Note  Katie Moody is a 62 y.o. female admitted on 03/04/2023 with pneumatosis of intestine .  Pharmacy has been consulted for Zosyn dosing.  Plan: Zosyn 3.375g IV q8h (4 hour infusion).  Pharmacy will continue to follow and will adjust abx dosing whenever warranted.  Temp (24hrs), Avg:97.6 F (36.4 C), Min:97.5 F (36.4 C), Max:97.6 F (36.4 C)   Recent Labs  Lab 02/23/2023 1746 03/01/2023 2123  WBC 9.9  --   CREATININE 2.18*  --   LATICACIDVEN  --  3.1*    CrCl cannot be calculated (Unknown ideal weight.).    No Known Allergies  Antimicrobials this admission: 11/02 Zosyn >>   Microbiology results: 11/02 BCx: Zosyn  Thank you for allowing pharmacy to be a part of this patient's care.  Otelia Sergeant, PharmD, [facility] 03/05/2023 11:54 PM

## 2023-02-07 ENCOUNTER — Encounter: Payer: Self-pay | Admitting: Internal Medicine

## 2023-02-07 ENCOUNTER — Inpatient Hospital Stay: Payer: Medicaid Other

## 2023-02-07 DIAGNOSIS — K6389 Other specified diseases of intestine: Secondary | ICD-10-CM | POA: Diagnosis not present

## 2023-02-07 DIAGNOSIS — A419 Sepsis, unspecified organism: Secondary | ICD-10-CM

## 2023-02-07 DIAGNOSIS — R6521 Severe sepsis with septic shock: Secondary | ICD-10-CM

## 2023-02-07 DIAGNOSIS — N179 Acute kidney failure, unspecified: Secondary | ICD-10-CM

## 2023-02-07 DIAGNOSIS — R140 Abdominal distension (gaseous): Secondary | ICD-10-CM | POA: Diagnosis not present

## 2023-02-07 DIAGNOSIS — K567 Ileus, unspecified: Secondary | ICD-10-CM

## 2023-02-07 DIAGNOSIS — I5032 Chronic diastolic (congestive) heart failure: Secondary | ICD-10-CM

## 2023-02-07 LAB — PROTIME-INR
INR: 1.4 — ABNORMAL HIGH (ref 0.8–1.2)
Prothrombin Time: 17 s — ABNORMAL HIGH (ref 11.4–15.2)

## 2023-02-07 LAB — CBC
HCT: 36.7 % (ref 36.0–46.0)
Hemoglobin: 12.1 g/dL (ref 12.0–15.0)
MCH: 30.9 pg (ref 26.0–34.0)
MCHC: 33 g/dL (ref 30.0–36.0)
MCV: 93.9 fL (ref 80.0–100.0)
Platelets: 343 10*3/uL (ref 150–400)
RBC: 3.91 MIL/uL (ref 3.87–5.11)
RDW: 12.4 % (ref 11.5–15.5)
WBC: 8.5 10*3/uL (ref 4.0–10.5)
nRBC: 0 % (ref 0.0–0.2)

## 2023-02-07 LAB — BLOOD GAS, ARTERIAL
Acid-base deficit: 3.9 mmol/L — ABNORMAL HIGH (ref 0.0–2.0)
Acid-base deficit: 11.6 mmol/L — ABNORMAL HIGH (ref 0.0–2.0)
Bicarbonate: 20.8 mmol/L (ref 20.0–28.0)
Bicarbonate: 15.6 mmol/L — ABNORMAL LOW (ref 20.0–28.0)
O2 Saturation: 93.3 %
O2 Saturation: 96.8 %
Patient temperature: 37
Patient temperature: 37
pCO2 arterial: 36 mm[Hg] (ref 32–48)
pCO2 arterial: 39 mm[Hg] (ref 32–48)
pH, Arterial: 7.37 (ref 7.35–7.45)
pH, Arterial: 7.21 — ABNORMAL LOW (ref 7.35–7.45)
pO2, Arterial: 69 mm[Hg] — ABNORMAL LOW (ref 83–108)
pO2, Arterial: 92 mm[Hg] (ref 83–108)

## 2023-02-07 LAB — GLUCOSE, CAPILLARY
Glucose-Capillary: 125 mg/dL — ABNORMAL HIGH (ref 70–99)
Glucose-Capillary: 72 mg/dL (ref 70–99)
Glucose-Capillary: 95 mg/dL (ref 70–99)
Glucose-Capillary: 96 mg/dL (ref 70–99)

## 2023-02-07 LAB — BASIC METABOLIC PANEL
Anion gap: 16 — ABNORMAL HIGH (ref 5–15)
BUN: 88 mg/dL — ABNORMAL HIGH (ref 8–23)
CO2: 22 mmol/L (ref 22–32)
Calcium: 6.9 mg/dL — ABNORMAL LOW (ref 8.9–10.3)
Chloride: 97 mmol/L — ABNORMAL LOW (ref 98–111)
Creatinine, Ser: 2.5 mg/dL — ABNORMAL HIGH (ref 0.44–1.00)
GFR, Estimated: 21 mL/min — ABNORMAL LOW (ref >60)
Glucose, Bld: 113 mg/dL — ABNORMAL HIGH (ref 70–99)
Potassium: 3.8 mmol/L (ref 3.5–5.1)
Sodium: 135 mmol/L (ref 135–145)

## 2023-02-07 LAB — LACTIC ACID, PLASMA
Lactic Acid, Venous: 1.5 mmol/L (ref 0.5–1.9)
Lactic Acid, Venous: 1.7 mmol/L (ref 0.5–1.9)
Lactic Acid, Venous: 2.7 mmol/L (ref 0.5–1.9)

## 2023-02-07 LAB — MAGNESIUM: Magnesium: 2.3 mg/dL (ref 1.7–2.4)

## 2023-02-07 LAB — ALBUMIN: Albumin: 2.9 g/dL — ABNORMAL LOW (ref 3.5–5.0)

## 2023-02-07 LAB — MRSA NEXT GEN BY PCR, NASAL: MRSA by PCR Next Gen: NOT DETECTED

## 2023-02-07 LAB — APTT: aPTT: 26 s (ref 24–36)

## 2023-02-07 LAB — BRAIN NATRIURETIC PEPTIDE: B Natriuretic Peptide: 116.1 pg/mL — ABNORMAL HIGH (ref 0.0–100.0)

## 2023-02-07 MED ORDER — VORICONAZOLE 200 MG IV SOLR
6.0000 mg/kg | Freq: Two times a day (BID) | INTRAVENOUS | Status: AC
  Administered 2023-02-07 (×2): 360 mg via INTRAVENOUS
  Filled 2023-02-07 (×2): qty 360

## 2023-02-07 MED ORDER — ATORVASTATIN CALCIUM 20 MG PO TABS: 40.0000 mg | ORAL_TABLET | Freq: Every day | ORAL | Status: DC

## 2023-02-07 MED ORDER — MECLIZINE HCL 25 MG PO TABS: 25.0000 mg | ORAL_TABLET | Freq: Three times a day (TID) | ORAL | Status: DC | PRN

## 2023-02-07 MED ORDER — HYDROMORPHONE HCL 1 MG/ML IJ SOLN
1.0000 mg | INTRAMUSCULAR | Status: DC | PRN
  Administered 2023-02-07 (×2): 1 mg via INTRAVENOUS
  Filled 2023-02-07 (×2): qty 1

## 2023-02-07 MED ORDER — HEPARIN SODIUM (PORCINE) 5000 UNIT/ML IJ SOLN
5000.0000 [IU] | Freq: Three times a day (TID) | INTRAMUSCULAR | Status: DC
  Administered 2023-02-08: 5000 [IU] via SUBCUTANEOUS
  Filled 2023-02-07 (×2): qty 1

## 2023-02-07 MED ORDER — ACETAMINOPHEN 500 MG PO TABS
1000.0000 mg | ORAL_TABLET | Freq: Once | ORAL | Status: AC
  Administered 2023-02-07: 1000 mg via ORAL
  Filled 2023-02-07: qty 2

## 2023-02-07 MED ORDER — SODIUM CHLORIDE 0.9 % IV SOLN
250.0000 mL | INTRAVENOUS | Status: AC
  Administered 2023-02-07: 250 mL via INTRAVENOUS

## 2023-02-07 MED ORDER — SODIUM CHLORIDE 0.9 % IV BOLUS
1000.0000 mL | Freq: Once | INTRAVENOUS | Status: AC
  Administered 2023-02-07: 1000 mL via INTRAVENOUS

## 2023-02-07 MED ORDER — NOREPINEPHRINE 4 MG/250ML-% IV SOLN: 0.0000 ug/min | INTRAVENOUS | Status: DC

## 2023-02-07 MED ORDER — CHLORHEXIDINE GLUCONATE CLOTH 2 % EX PADS
6.0000 | MEDICATED_PAD | Freq: Every day | CUTANEOUS | Status: DC
Start: 2023-02-07 — End: 2023-02-08
  Administered 2023-02-07: 6 via TOPICAL

## 2023-02-07 MED ORDER — NOREPINEPHRINE 4 MG/250ML-% IV SOLN
2.0000 ug/min | INTRAVENOUS | Status: DC
  Administered 2023-02-07: 2 ug/min via INTRAVENOUS
  Filled 2023-02-07: qty 250

## 2023-02-07 MED ORDER — VORICONAZOLE 200 MG IV SOLR
4.0000 mg/kg | Freq: Two times a day (BID) | INTRAVENOUS | Status: DC
  Administered 2023-02-08: 240 mg via INTRAVENOUS
  Filled 2023-02-07 (×2): qty 240

## 2023-02-07 MED ORDER — ASPIRIN 81 MG PO TBEC: 81.0000 mg | DELAYED_RELEASE_TABLET | Freq: Every day | ORAL | Status: DC

## 2023-02-07 MED ORDER — ACETAMINOPHEN 10 MG/ML IV SOLN
1000.0000 mg | Freq: Four times a day (QID) | INTRAVENOUS | Status: DC
  Administered 2023-02-07: 1000 mg via INTRAVENOUS
  Filled 2023-02-07 (×3): qty 100

## 2023-02-07 MED ORDER — MORPHINE SULFATE (PF) 2 MG/ML IV SOLN: 2.0000 mg | INTRAVENOUS | Status: DC | PRN

## 2023-02-07 MED ORDER — IPRATROPIUM-ALBUTEROL 0.5-2.5 (3) MG/3ML IN SOLN
3.0000 mL | Freq: Four times a day (QID) | RESPIRATORY_TRACT | Status: DC
  Administered 2023-02-08 – 2023-02-09 (×5): 3 mL via RESPIRATORY_TRACT
  Filled 2023-02-07 (×6): qty 3

## 2023-02-07 MED ORDER — UMECLIDINIUM-VILANTEROL 62.5-25 MCG/ACT IN AEPB
1.0000 | INHALATION_SPRAY | Freq: Every day | RESPIRATORY_TRACT | Status: DC
  Filled 2023-02-07: qty 14

## 2023-02-07 MED ORDER — VASOPRESSIN 20 UNITS/100 ML INFUSION FOR SHOCK
0.0000 [IU]/min | INTRAVENOUS | Status: DC
  Administered 2023-02-07 (×2): 0.04 [IU]/min via INTRAVENOUS
  Administered 2023-02-08: 0.03 [IU]/min via INTRAVENOUS
  Administered 2023-02-08: 0.04 [IU]/min via INTRAVENOUS
  Administered 2023-02-09: 0.03 [IU]/min via INTRAVENOUS
  Filled 2023-02-07 (×5): qty 100

## 2023-02-07 MED ORDER — ORAL CARE MOUTH RINSE: 15.0000 mL | OROMUCOSAL | Status: DC | PRN

## 2023-02-07 MED ORDER — NOREPINEPHRINE 16 MG/250ML-% IV SOLN
0.0000 ug/min | INTRAVENOUS | Status: DC
  Administered 2023-02-07: 40 ug/min via INTRAVENOUS
  Administered 2023-02-07: 27 ug/min via INTRAVENOUS
  Administered 2023-02-08 (×2): 30 ug/min via INTRAVENOUS
  Administered 2023-02-08: 32 ug/min via INTRAVENOUS
  Administered 2023-02-09: 26 ug/min via INTRAVENOUS
  Filled 2023-02-07 (×7): qty 250

## 2023-02-07 NOTE — ED Notes (Signed)
MD notified of trending decrease in B/P.

## 2023-02-07 NOTE — Procedures (Signed)
Arterial Catheter Insertion Procedure Note  Katie Moody  [id number]  07/15/1960  Date:02/07/23  Time:7:11 PM    Provider Performing: Katie Moody    Procedure: Insertion of Arterial Line (95188) with US guidance (41660)   Indication(s) Blood pressure monitoring and/or need for frequent ABGs  Consent Risks of the procedure as well as the alternatives and risks of each were explained to the patient and/or caregiver.  Consent for the procedure was obtained and is signed in the bedside chart  Anesthesia None   Time Out Verified patient identification, verified procedure, site/side was marked, verified correct patient position, special equipment/implants available, medications/allergies/relevant history reviewed, required imaging and test results available.   Sterile Technique Maximal sterile technique including full sterile barrier drape, hand hygiene, sterile gown, sterile gloves, mask, hair covering, sterile ultrasound probe cover (if used).   Procedure Description Area of catheter insertion was cleaned with chlorhexidine and draped in sterile fashion. With real-time ultrasound guidance an arterial catheter was placed into the left radial artery.  Appropriate arterial tracings confirmed on monitor.     Complications/Tolerance None; patient tolerated the procedure well.   EBL Minimal   Specimen(s) None  Katie Chute, MD [name] 02/07/2023 7:11 PM

## 2023-02-07 NOTE — Progress Notes (Signed)
PROGRESS NOTE    Katie Moody  WUJ:[id number] DOB: July 25, 1960 DOA: 03/05/2023 PCP: [facility]   Assessment & Plan:   Principal Problem:   Pneumatosis of intestines Active Problems:   CAD (coronary artery disease)   AKI (acute kidney injury) (HCC)   Chronic obstructive pulmonary disease (HCC)   Chronic diastolic CHF (congestive heart failure) (HCC)   Hypertension   Hypokalemia   HLD (hyperlipidemia)   Adrenal nodule (HCC)  Assessment and Plan: Pneumatosis of intestines: CT showed pneumatosis of the cecum and ascending colon concerning for typhlitis. Pt is not immunosuppressed.  Elevated lactic acid. Not septic. Continue on IV zosyn, voriconazole. Blood cxs pending. Continue on IVFs. Dilaudid, percocet prn. NPO. Continue w/ NG tube. Gen surg following and recs apprec. ICU consulted   Hypotension: started on levo and wean as tolerated. Keep MAP > 65. ICU consulted    Hx of CAD: hold aspirin, statin while NPO    AKI: continue on IVFs. Holding home dose of losartan    COPD: w/o exacerbation. Bronchodilators prn    Chronic diastolic  CHF: echo on 2/2 6/24 showed EF 55% with grade 1 diastolic dysfunction.  No leg edema JVD.  BNP 116.  CHF seem to be compensated. Monitor I/Os   HTN: holding home dose of losartan, metoprolol secondary to hypotension    Hypokalemia: WNL    HLD: statin    Adrenal nodule: incidental findings by CT scan. Will need to f/u outpatient w/ PCP             DVT prophylaxis: SCDs Code Status: full  Family Communication: discussed pt's care w/ pt's sister, Harriett Sine, and answered her questions  Disposition Plan: unclear  Level of care: Progressive Status is: Inpatient Remains inpatient appropriate because: severity of illness    Consultants:  Gen surg ICU   Procedures:   Antimicrobials:   Subjective: Pt c/o abd pain   Objective: Vitals:   02/07/23 0633 02/07/23 0720 02/07/23 0725 02/07/23 0800  BP: 96/73  (!) 83/51 (!) 85/44 (!) 95/54  Pulse: (!) 104 (!) 102 (!) 101   Resp: (!) 24 (!) 23 (!) 22   Temp:      TempSrc:      SpO2: 93% 94% 94%   Weight:        Intake/Output Summary (Last 24 hours) at 02/07/2023 0819 Last data filed at 02/07/2023 0723 Gross per 24 hour  Intake 1698.95 ml  Output --  Net 1698.95 ml   Filed Weights   02/07/23 0141  Weight: 60.6 kg    Examination:  General exam: Appears uncomfortable  Respiratory system: decreased breath sounds b/l  Cardiovascular system: S1 & S2 +. No  rubs, gallops or clicks.  Gastrointestinal system: Abdomen is distended and tenderness to palpation. . hypoactive bowel sounds heard. Central nervous system: Alert and oriented. Moves all extremities Psychiatry: Judgement and insight appear normal. Flat mood and affect    Data Reviewed: I have personally reviewed following labs and imaging studies  CBC: Recent Labs  Lab 02/05/2023 1746 02/07/23 0339  WBC 9.9 8.5  HGB 13.5 12.1  HCT 40.6 36.7  MCV 91.6 93.9  PLT 377 343   Basic Metabolic Panel: Recent Labs  Lab 02/15/2023 1746 02/07/23 0339  NA 134* 135  K 3.3* 3.8  CL 89* 97*  CO2 28 22  GLUCOSE 131* 113*  BUN 87* 88*  CREATININE 2.18* 2.50*  CALCIUM 8.2* 6.9*  MG 2.9* 2.3   GFR: Estimated Creatinine Clearance:  21.7 mL/min (A) (by C-G formula based on SCr of 2.5 mg/dL (H)). Liver Function Tests: Recent Labs  Lab 02/13/2023 1746 02/07/23 0339  AST 33  --   ALT 14  --   ALKPHOS 65  --   BILITOT 3.1*  --   PROT 7.8  --   ALBUMIN 3.9 2.9*   Recent Labs  Lab 03/02/2023 1746  LIPASE 28   No results for input(s): "AMMONIA" in the last 168 hours. Coagulation Profile: Recent Labs  Lab 02/07/23 0339  INR 1.4*   Cardiac Enzymes: No results for input(s): "CKTOTAL", "CKMB", "CKMBINDEX", "TROPONINI" in the last 168 hours. BNP (last 3 results) No results for input(s): "PROBNP" in the last 8760 hours. HbA1C: No results for input(s): "HGBA1C" in the last 72  hours. CBG: No results for input(s): "GLUCAP" in the last 168 hours. Lipid Profile: No results for input(s): "CHOL", "HDL", "LDLCALC", "TRIG", "CHOLHDL", "LDLDIRECT" in the last 72 hours. Thyroid Function Tests: No results for input(s): "TSH", "T4TOTAL", "FREET4", "T3FREE", "THYROIDAB" in the last 72 hours. Anemia Panel: No results for input(s): "VITAMINB12", "FOLATE", "FERRITIN", "TIBC", "IRON", "RETICCTPCT" in the last 72 hours. Sepsis Labs: Recent Labs  Lab 02/13/2023 2123 02/21/2023 2358 02/07/23 0623  LATICACIDVEN 3.1* 2.7* 1.7    Recent Results (from the past 240 hour(s))  Blood culture (routine x 2)     Status: None (Preliminary result)   Collection Time: 02/17/2023  9:24 PM   Specimen: Right Antecubital; Blood  Result Value Ref Range Status   Specimen Description RIGHT ANTECUBITAL  Final   Special Requests   Final    BOTTLES DRAWN AEROBIC AND ANAEROBIC Blood Culture adequate volume   Culture   Final    NO GROWTH < 12 HOURS Performed at [facility], [address] [place], [place] [address]    Report Status PENDING  Incomplete  Blood culture (routine x 2)     Status: None (Preliminary result)   Collection Time: 02/13/2023  9:36 PM   Specimen: Left Antecubital; Blood  Result Value Ref Range Status   Specimen Description LEFT ANTECUBITAL  Final   Special Requests   Final    BOTTLES DRAWN AEROBIC AND ANAEROBIC Blood Culture adequate volume   Culture   Final    NO GROWTH < 12 HOURS Performed at [facility], [address], [place], [place] [address]    Report Status PENDING  Incomplete         Radiology Studies: DG Abdomen 1 View  Result Date: 02/14/2023 CLINICAL DATA:  NG tube placement EXAM: ABDOMEN - 1 VIEW COMPARISON:  None Available. FINDINGS: NG tube is in the mid stomach. Dilated bowel in the upper abdomen. Lung bases clear. IMPRESSION: NG tube in the mid stomach. Electronically Signed   By: Charlett Nose M.D.   On: 03/04/2023 23:21   CT  ABDOMEN PELVIS WO CONTRAST  Result Date: 02/17/2023 CLINICAL DATA:  Sepsis. EXAM: CT ABDOMEN AND PELVIS WITHOUT CONTRAST TECHNIQUE: Multidetector CT imaging of the abdomen and pelvis was performed following the standard protocol without IV contrast. RADIATION DOSE REDUCTION: This exam was performed according to the departmental dose-optimization program which includes automated exposure control, adjustment of the mA and/or kV according to patient size and/or use of iterative reconstruction technique. COMPARISON:  None Available. FINDINGS: Lower chest: The visualized lung bases are clear. There is coronary vascular calcification. No intra-abdominal free air or free fluid. Hepatobiliary: The liver is unremarkable. No biliary dilatation. The gallbladder is contracted. No calcified gallstone.  Pancreas: Unremarkable. No pancreatic ductal dilatation or surrounding inflammatory changes. Spleen: Normal in size without focal abnormality. Adrenals/Urinary Tract: The left adrenal glands unremarkable. There is a 12 mm indeterminate right adrenal nodule. There is no hydronephrosis or nephrolithiasis on either side. The visualized ureters appear unremarkable. The urinary bladder is collapsed. Stomach/Bowel: There is mild thickened appearance of the wall of the cecum and ascending colon with intramural gas concerning for pneumatosis and suggestive of a necrotizing inflammatory process or typhlitis. Clinical correlation is recommended. Several mildly dilated loops of small bowel measure up to 3.5 cm in caliber, likely reactive ileus. No discrete transition noted to suggest obstruction. There is sigmoid diverticulosis and several scattered colonic diverticula. The appendix is normal. Vascular/Lymphatic: Advanced aortoiliac atherosclerotic disease. The IVC is unremarkable. No portal venous gas. There is no adenopathy. Reproductive: Hysterectomy.  No suspicious adnexal masses. Other: None Musculoskeletal: Disc desiccation and vacuum  phenomena at L5-S1. No acute osseous pathology. IMPRESSION: 1. 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 Clinical correlation is recommended. No portal venous gas or pneumoperitoneum. 2. Mildly dilated small bowel loops, likely reactive ileus. 3. Sigmoid diverticulosis. Normal appendix. 4.  Aortic Atherosclerosis (ICD10-I70.0). These results were called by telephone at the time of interpretation on 02/12/2023 at 10:09 pm to provider PHILLIP STAFFORD , who verbally acknowledged these results. Electronically Signed   By: Elgie Collard M.D.   On: 02/07/2023 22:12        Scheduled Meds:  aspirin EC  81 mg Oral Daily   atorvastatin  40 mg Oral Daily   umeclidinium-vilanterol  1 puff Inhalation Daily   Continuous Infusions:  sodium chloride 100 mL/hr at 02/07/23 0155   piperacillin-tazobactam (ZOSYN)  IV Stopped (02/07/23 0723)   voriconazole Stopped (02/07/23 0536)   Followed by   Melene Muller ON 02/15/2023] voriconazole       LOS: 1 day       Charise Killian, MD [facility] Pager [phone]  If 7PM-7AM, please contact night-coverage [email or web address]  02/07/2023, 8:19 AM

## 2023-02-07 NOTE — ED Notes (Signed)
Pt informed he brother wanted her to call for a update. Pt stated she would call him when she got moved to her new room.

## 2023-02-07 NOTE — Progress Notes (Signed)
Central line placed by Dr. Aundria Rud after consent was obtained and time-out performed.  Patient tolerated the procedure well and x-ray for confirmation being performed now.

## 2023-02-07 NOTE — Consult Note (Signed)
NAME:  Katie Moody, MRN:  [id number], DOB:  1960-08-06, LOS: 1 ADMISSION DATE:  02/07/2023, CONSULTATION DATE:  02/07/2023 REFERRING MD:  Fabienne Bruns, MD, CHIEF COMPLAINT:  Sepsis   History of Present Illness:   Patient is a 62 year old female presenting to the hospital with abdominal pain. She was found to have pneumatosis intestinalis and is admitted to for further management.  Patient presented today with abdominal pain of a week's duration. She was noted to have diffuse and constant abdominal pain with a distended abdomen. She reported nausea and several episodes of non-bilious and non-blood vomiting. She had no fevers or chills. On presentation, she was noted to be hypotensive, with improvement following fluid resuscitation.  Imaging in the ED included a CT scan of the abdomen and pelvis which was notable for pneumatosis of the cecum and ascending colon, concerning for typhlitis. There was also associated ileus. She was started on IV antibiotics and admitted. Overnight, blood pressure remained soft. PCCM consulted for initiation of vasopressors for management of septic shock.  Pertinent  Medical History   -CAD -HFpEF -HLD -HTN -COPD on Anoro  Significant Hospital Events: Including procedures, antibiotic start and stop dates in addition to other pertinent events   02/20/2023: admit to [facility], [place] with pneumatosis 02/07/2023: transfer to ICU, increased pressor requirements. CVC and aline placed  Objective   Blood pressure (!) 104/58, pulse (!) 101, temperature 98.1 F (36.7 C), temperature source Oral, resp. rate (!) 22, weight 60.6 kg, SpO2 94%.        Intake/Output Summary (Last 24 hours) at 02/07/2023 0944 Last data filed at 02/07/2023 0723 Gross per 24 hour  Intake 1698.95 ml  Output --  Net 1698.95 ml   Filed Weights   02/07/23 0141  Weight: 60.6 kg    Examination: Physical Exam Constitutional:      Appearance: She is ill-appearing.  Cardiovascular:     Rate  and Rhythm: Normal rate and regular rhythm.     Pulses: Normal pulses.     Heart sounds: Normal heart sounds.  Pulmonary:     Effort: Pulmonary effort is normal.     Breath sounds: Normal breath sounds.  Abdominal:     General: There is distension.     Tenderness: There is abdominal tenderness.  Neurological:     General: No focal deficit present.     Mental Status: She is alert and oriented to person, place, and time. Mental status is at baseline.     Assessment & Plan:   Neurology #Pain  Ordered hydromorphone for pain control, will augment with IV tylenol  Cardiovascular #Septic Shock #HFpEF  Secondary to pneumatosis intestinalis and possible typhlitis. She is volume resuscitated and will slow down on IV fluids given history of diastolic dysfunction on echocardiogram. Will resort to vasopressors with norepinephrine and vasopressin and monitor for signs of volume overload. Unlikely to have achieved source control.  -titrate vasopressors for goal MAP > 65 mmHg -trend lactic acid  Pulmonary #COPD  Switch inhalers to duo-neb every 6 hours. Maintain SpO2 > 92%.  Gastrointestinal #Pneumotosis Intestinalis  Abdominal CT with pneumatosis of the cecum and ascending colon concerning for typhlitis. Initially with elevated lactic acid but this has resolved. She has an NG tube on continuous suction for decompression, and is to remain NPO for now. Will continue with broad spectrum antibiotics and with pain control.  Renal #AKI  Presents with AKI, with creatinine of 2.5 and BUN of 88. Patient overall 4 liters positive, with  minimal urine output, and is likely in ATN. Might require consultation with nephrology tomorrow if doesn't show any signs of renal recovery.  -avoid nephrotoxins -monitor electrolytes -trend abg  Endocrine  ICU glycemic protocol, consider hydrocortisone if shock persists. 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   Best Practice (right click and "Reselect all SmartList Selections" daily)   Diet/type: NPO DVT prophylaxis: prophylactic heparin  GI prophylaxis: N/A Lines: Central line Foley:  Yes, and it is still needed Code Status:  full code Last date of multidisciplinary goals of care discussion [02/07/2023]  Labs   CBC: Recent Labs  Lab 02/18/2023 1746 02/07/23 0339  WBC 9.9 8.5  HGB 13.5 12.1  HCT 40.6 36.7  MCV 91.6 93.9  PLT 377 343    Basic Metabolic Panel: Recent Labs  Lab 02/28/2023 1746 02/07/23 0339  NA 134* 135  K 3.3* 3.8  CL 89* 97*  CO2 28 22  GLUCOSE 131* 113*  BUN 87* 88*  CREATININE 2.18* 2.50*  CALCIUM 8.2* 6.9*  MG 2.9* 2.3   GFR: Estimated Creatinine Clearance: 21.7 mL/min (A) (by C-G formula based on SCr of 2.5 mg/dL (H)). Recent Labs  Lab 02/10/2023 1746 02/16/2023 2123 03/03/2023 2358 02/07/23 0339 02/07/23 0623  WBC 9.9  --   --  8.5  --   LATICACIDVEN  --  3.1* 2.7*  --  1.7    Liver Function Tests: Recent Labs  Lab 02/07/2023 1746 02/07/23 0339  AST 33  --   ALT 14  --   ALKPHOS 65  --   BILITOT 3.1*  --   PROT 7.8  --   ALBUMIN 3.9 2.9*   Recent Labs  Lab 03/03/2023 1746  LIPASE 28   No results for input(s): "AMMONIA" in the last 168 hours.  ABG    Component Value Date/Time   PHART 7.37 02/07/2023 0539   PCO2ART 36 02/07/2023 0539   PO2ART 69 (L) 02/07/2023 0539   HCO3 20.8 02/07/2023 0539   ACIDBASEDEF 3.9 (H) 02/07/2023 0539   O2SAT 93.3 02/07/2023 0539     Coagulation Profile: Recent Labs  Lab 02/07/23 0339  INR 1.4*    Cardiac Enzymes: No results for input(s): "CKTOTAL", "CKMB", "CKMBINDEX", "TROPONINI" in the last 168 hours.  HbA1C: Hgb  A1c MFr Bld  Date/Time Value Ref Range Status  06/01/2022 09:06 AM 5.8 (H) 4.8 - 5.6 % Final    Comment:    (NOTE)         Prediabetes: 5.7 - 6.4         Diabetes: >6.4         Glycemic control for adults with diabetes: <7.0     CBG: No results for input(s): "GLUCAP" in the last 168 hours.   Past Medical History:  She,  has a past medical history of CAD (coronary artery disease), Chronic diastolic CHF (congestive heart failure) (HCC), COPD (chronic obstructive pulmonary disease) (HCC), and Hypertension.   Surgical History:   Past Surgical History:  Procedure Laterality Date   ABDOMINAL HYSTERECTOMY     LEFT HEART CATH AND CORONARY ANGIOGRAPHY N/A 06/04/2022   Procedure: LEFT HEART CATH AND CORONARY ANGIOGRAPHY;  Surgeon: Marykay Lex, MD;  Location: [facility] INVASIVE CV LAB;  Service: Cardiovascular;  Laterality: N/A;     Social History:   reports that she quit smoking about 9 months ago. Her smoking use included cigarettes. She started smoking about 30 years ago. She has a 7.5 pack-year smoking history. She has never used smokeless tobacco. She reports current alcohol use. She reports that she does not use drugs.   Family History:  Her family history includes Heart attack in her mother; Stroke in her mother.   Allergies No Known Allergies   Home Medications  Prior to Admission medications   Medication Sig Start Date End Date Taking? Authorizing Provider  aspirin EC 81 MG tablet Take 1 tablet (81 mg total) by mouth daily. Swallow whole. 07/20/22  Yes Furth, Cadence H, PA-C  atorvastatin (LIPITOR) 40 MG tablet Take 1 tablet (40 mg total) by mouth daily. 11/30/22  Yes Furth, Cadence H, PA-C  losartan (COZAAR) 50 MG tablet Take 1 tablet (50 mg total) by mouth daily. 12/23/22  Yes Furth, Cadence H, PA-C  meclizine (ANTIVERT) 25 MG tablet Take 1 tablet (25 mg total) by mouth 3 (three) times daily as needed for dizziness. 08/23/21  Yes Cuthriell, Delorise Royals, PA-C  PROVENTIL HFA 108  (90 Base) MCG/ACT inhaler Inhale 2 puffs into the lungs every 4 (four) hours as needed for shortness of breath or wheezing. 12/22/21  Yes [provider]  umeclidinium-vilanterol (ANORO ELLIPTA) 62.5-25 MCG/ACT AEPB Inhale 1 puff into the lungs daily. 08/04/22  Yes Kamin Niblack, Lianne Bushy, MD  metoprolol succinate (TOPROL-XL) 25 MG 24 hr tablet Take 1 tablet (25 mg total) by mouth daily. Patient not taking: Reported on 02/07/2023 06/09/22   Arnetha Courser, MD  Multiple Vitamin (MULTIVITAMIN WITH MINERALS) TABS tablet Take 1 tablet by mouth daily. Patient not taking: Reported on 02/07/2023 06/09/22   Arnetha Courser, MD     Critical care time: 70 minutes    [name], MD [facility] 02/07/2023 8:03 PM

## 2023-02-07 NOTE — H&P (View-Only) (Signed)
[facility] SURGICAL CONSULTATION NOTE (initial) - cpt: 99255    HISTORY OF PRESENT ILLNESS (HPI):  62 y.o. female presented to [facility] ED last evening for evaluation of abdominal distention and pain. Patient reports a weeklong history of gradually progressive abdominal distention, with obstipation and constipation.  She reports diminished appetite, followed by nausea and vomiting.  She denies fevers and chills.  Reports loose stools of late.  She reports she feels somewhat better than on admission.    Surgery is consulted by  ED physician Dr. Scotty Court in this context for evaluation and management of marked abdominal distention, possible typhlitis, AKI with dehydration, severe associated ileus.  PAST MEDICAL HISTORY (PMH):  Past Medical History:  Diagnosis Date   CAD (coronary artery disease)    Chronic diastolic CHF (congestive heart failure) (HCC)    COPD (chronic obstructive pulmonary disease) (HCC)    Hypertension      PAST SURGICAL HISTORY (PSH):  Past Surgical History:  Procedure Laterality Date   ABDOMINAL HYSTERECTOMY     LEFT HEART CATH AND CORONARY ANGIOGRAPHY N/A 06/04/2022   Procedure: LEFT HEART CATH AND CORONARY ANGIOGRAPHY;  Surgeon: Marykay Lex, MD;  Location: [facility] INVASIVE CV LAB;  Service: Cardiovascular;  Laterality: N/A;     MEDICATIONS:  Prior to Admission medications   Medication Sig Start Date End Date Taking? Authorizing Provider  aspirin EC 81 MG tablet Take 1 tablet (81 mg total) by mouth daily. Swallow whole. 07/20/22  Yes Furth, Cadence H, PA-C  atorvastatin (LIPITOR) 40 MG tablet Take 1 tablet (40 mg total) by mouth daily. 11/30/22  Yes Furth, Cadence H, PA-C  losartan (COZAAR) 50 MG tablet Take 1 tablet (50 mg total) by mouth daily. 12/23/22  Yes Furth, Cadence H, PA-C  meclizine (ANTIVERT) 25 MG tablet Take 1 tablet (25 mg total) by mouth 3 (three) times daily as needed for dizziness. 08/23/21  Yes Cuthriell, Delorise Royals, PA-C  PROVENTIL  HFA 108 (90 Base) MCG/ACT inhaler Inhale 2 puffs into the lungs every 4 (four) hours as needed for shortness of breath or wheezing. 12/22/21  Yes [provider]  umeclidinium-vilanterol (ANORO ELLIPTA) 62.5-25 MCG/ACT AEPB Inhale 1 puff into the lungs daily. 08/04/22  Yes Dgayli, Lianne Bushy, MD  metoprolol succinate (TOPROL-XL) 25 MG 24 hr tablet Take 1 tablet (25 mg total) by mouth daily. Patient not taking: Reported on 02/07/2023 06/09/22   Arnetha Courser, MD  Multiple Vitamin (MULTIVITAMIN WITH MINERALS) TABS tablet Take 1 tablet by mouth daily. Patient not taking: Reported on 02/07/2023 06/09/22   Arnetha Courser, MD     ALLERGIES:  No Known Allergies   SOCIAL HISTORY:  Social History   Socioeconomic History   Marital status: Single    Spouse name: Not on file   Number of children: 0   Years of education: Not on file   Highest education level: High school graduate  Occupational History   Not on file  Tobacco Use   Smoking status: Former    Current packs/day: 0.00    Average packs/day: 0.3 packs/day for 30.0 years (7.5 ttl pk-yrs)    Types: Cigarettes    Start date: 05/1992    Quit date: 05/2022    Years since quitting: 0.7   Smokeless tobacco: Never  Vaping Use   Vaping status: Never Used  Substance and Sexual Activity   Alcohol use: Yes    Comment: occasionally   Drug use: Never   Sexual activity: Not Currently    Birth control/protection: Post-menopausal  Other Topics Concern   Not on file  Social History Narrative   Not on file   Social Determinants of Health   Financial Resource Strain: Not on file  Food Insecurity: No Food Insecurity (02/09/2022)   Hunger Vital Sign    Worried About Running Out of Food in the Last Year: Never true    Ran Out of Food in the Last Year: Never true  Transportation Needs: No Transportation Needs (02/09/2022)   PRAPARE - Administrator, Civil Service (Medical): No    Lack of Transportation (Non-Medical): No  Physical  Activity: Not on file  Stress: Not on file  Social Connections: Not on file  Intimate Partner Violence: Not on file     FAMILY HISTORY:  Family History  Problem Relation Age of Onset   Heart attack Mother    Stroke Mother       REVIEW OF SYSTEMS:  Review of Systems  Unable to perform ROS: Acuity of condition  General: no fevers, chills, no body weight gain, has poor appetite, has fatigue HEENT: no blurry vision, hearing changes or sore throat Respiratory: no dyspnea, coughing, wheezing CV: no chest pain, no palpitations GI: has nausea, vomiting, abdominal pain, diarrhea, no constipation GU: no dysuria, burning on urination, increased urinary frequency, hematuria  Ext: no leg edema Neuro: no unilateral weakness, numbness, or tingling, no vision change or hearing loss Skin: no rash, no skin tear. MSK: No muscle spasm, no deformity, no limitation of range of movement in spin Heme: No easy bruising.  Travel history: No recent long distant travel.  VITAL SIGNS:  Temp:  [97.5 F (36.4 C)-99.1 F (37.3 C)] 98.1 F (36.7 C) (11/03 1035) Pulse Rate:  [72-117] 105 (11/03 1000) Resp:  [16-28] 28 (11/03 1035) BP: (64-114)/(26-82) 91/59 (11/03 1035) SpO2:  [90 %-100 %] 92 % (11/03 1035) Weight:  [60.6 kg-64.5 kg] 64.5 kg (11/03 1035)       Weight: 64.5 kg     INTAKE/OUTPUT:  11/02 0701 - 11/03 0700 In: 1649 [IV Piggyback:1649] Out: -   PHYSICAL EXAM:  Physical Exam Blood pressure (!) 91/59, pulse (!) 105, temperature 98.1 F (36.7 C), temperature source Oral, resp. rate (!) 28, weight 64.5 kg, SpO2 92%. Last Weight  Most recent update: 02/07/2023 10:36 AM    Weight  64.5 kg (142 lb 3.2 oz)             CONSTITUTIONAL: Well developed, and nourished, appropriately responsive and aware without distress.  Clearly tachypneic. EYES: Sclera non-icteric.   EARS, NOSE, MOUTH AND THROAT:  The oropharynx is clear. Oral mucosa is pink and moist.     Hearing is intact to voice.   NECK: Trachea is midline, and there is no jugular venous distension.  LYMPH NODES:  Lymph nodes in the neck are not enlarged. RESPIRATORY: No crackles, rhonchi or wheezing.  Presently tachypneic. CARDIOVASCULAR: Heart is regular in rate and rhythm.    GI: The abdomen is markedly distended, diffusely tender without guarding or rebound.  No evidence of peritonitis.  MUSCULOSKELETAL:  Symmetrical muscle tone appreciated in all four extremities. Warm without edema.  SKIN: Skin turgor is normal. No pathologic skin lesions appreciated.  NEUROLOGIC:  Motor and sensation appear grossly normal.  Cranial nerves are grossly without defect. PSYCH:  Alert and oriented to person, place and time. Affect is appropriate for situation.  Data Reviewed I have personally reviewed what is currently available of the patient's imaging, recent labs and medical records.  Labs:     Latest Ref Rng & Units 02/07/2023    3:39 AM 02/22/2023    5:46 PM 10/10/2022   12:49 PM  CBC  WBC 4.0 - 10.5 K/uL 8.5  9.9  5.2   Hemoglobin 12.0 - 15.0 g/dL 16.1  09.6  04.5   Hematocrit 36.0 - 46.0 % 36.7  40.6  41.3   Platelets 150 - 400 K/uL 343  377  281       Latest Ref Rng & Units 02/07/2023    3:39 AM 02/28/2023    5:46 PM 10/10/2022   12:49 PM  CMP  Glucose 70 - 99 mg/dL 409  811  96   BUN 8 - 23 mg/dL 88  87  13   Creatinine 0.44 - 1.00 mg/dL 9.14  7.82  9.56   Sodium 135 - 145 mmol/L 135  134  135   Potassium 3.5 - 5.1 mmol/L 3.8  3.3  4.0   Chloride 98 - 111 mmol/L 97  89  101   CO2 22 - 32 mmol/L 22  28  27    Calcium 8.9 - 10.3 mg/dL 6.9  8.2  9.0   Total Protein 6.5 - 8.1 g/dL  7.8  7.8   Total Bilirubin 0.3 - 1.2 mg/dL  3.1  0.9   Alkaline Phos 38 - 126 U/L  65  101   AST 15 - 41 U/L  33  21   ALT 0 - 44 U/L  14  19      Imaging studies:   Last 24 hrs: DG Abdomen 1 View  Result Date: 02/28/2023 CLINICAL DATA:  NG tube placement EXAM: ABDOMEN - 1 VIEW COMPARISON:  None Available. FINDINGS: NG tube is in  the mid stomach. Dilated bowel in the upper abdomen. Lung bases clear. IMPRESSION: NG tube in the mid stomach. Electronically Signed   By: Charlett Nose M.D.   On: 02/07/2023 23:21   CT ABDOMEN PELVIS WO CONTRAST  Result Date: 03/06/2023 CLINICAL DATA:  Sepsis. EXAM: CT ABDOMEN AND PELVIS WITHOUT CONTRAST TECHNIQUE: Multidetector CT imaging of the abdomen and pelvis was performed following the standard protocol without IV contrast. RADIATION DOSE REDUCTION: This exam was performed according to the departmental dose-optimization program which includes automated exposure control, adjustment of the mA and/or kV according to patient size and/or use of iterative reconstruction technique. COMPARISON:  None Available. FINDINGS: Lower chest: The visualized lung bases are clear. There is coronary vascular calcification. No intra-abdominal free air or free fluid. Hepatobiliary: The liver is unremarkable. No biliary dilatation. The gallbladder is contracted. No calcified gallstone. Pancreas: Unremarkable. No pancreatic ductal dilatation or surrounding inflammatory changes. Spleen: Normal in size without focal abnormality. Adrenals/Urinary Tract: The left adrenal glands unremarkable. There is a 12 mm indeterminate right adrenal nodule. There is no hydronephrosis or nephrolithiasis on either side. The visualized ureters appear unremarkable. The urinary bladder is collapsed. Stomach/Bowel: There is mild thickened appearance of the wall of the cecum and ascending colon with intramural gas concerning for pneumatosis and suggestive of a necrotizing inflammatory process or typhlitis. Clinical correlation is recommended. Several mildly dilated loops of small bowel measure up to 3.5 cm in caliber, likely reactive ileus. No discrete transition noted to suggest obstruction. There is sigmoid diverticulosis and several scattered colonic diverticula. The appendix is normal. Vascular/Lymphatic: Advanced aortoiliac atherosclerotic disease.  The IVC is unremarkable. No portal venous gas. There is no adenopathy. Reproductive: Hysterectomy.  No suspicious adnexal masses. Other: None Musculoskeletal: Disc desiccation  and vacuum phenomena at L5-S1. No acute osseous pathology. IMPRESSION: 1. 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 Clinical correlation is recommended. No portal venous gas or pneumoperitoneum. 2. Mildly dilated small bowel loops, likely reactive ileus. 3. Sigmoid diverticulosis. Normal appendix. 4.  Aortic Atherosclerosis (ICD10-I70.0). These results were called by telephone at the time of interpretation on 03/01/2023 at 10:09 pm to provider PHILLIP STAFFORD , who verbally acknowledged these results. Electronically Signed   By: Elgie Collard M.D.   On: 03/05/2023 22:12     Assessment/Plan:  62 y.o. female with marked abdominal distention, right sided colonic distention with pneumatosis, associated ileus, AKI.  Admitted with lactic acidosis, now resolved over night with hydration.  Currently hypotensive on vasopressor.  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  -Agree with broad-spectrum antibiotic coverage, bowel rest, NG tube decompression.  -Continue fluid resuscitation, vasopressors as needed.    - DVT and PPI prophylaxis  -Will continue to observe closely with serial exams, labs.  -Discussed at length with patient uncertainty regarding her need for eventual operative intervention.  At present time she remains hopeful that antibiotics and bowel rest will be sufficient, I was forthcoming regarding my concerns that we will eventually need to proceed with exploratory laparotomy and possible extensive colonic resection.  I believe if she understands she remains at risk for further decompensation, and advised that when she is ready/has peace with proceeding with surgery to reach out through nursing staff.  In the interim I remain readily available for this challenging diagnostic dilemma.  All of the above findings and recommendations were discussed with the patient, and all of patient's questions were answered to their expressed satisfaction.  Thank you for the opportunity to participate in this patient's care.   -- Campbell Lerner, M.D., FACS 02/07/2023, 10:40 AM

## 2023-02-07 NOTE — ED Notes (Signed)
Called and spoke with NP Jon Billings regarding pt morning labs and increase in HR and respirations. New orders given.

## 2023-02-07 NOTE — Progress Notes (Signed)
CBG on admission not transferring over to epic. CBG 72.

## 2023-02-07 NOTE — ED Notes (Signed)
Pt offered pain interventions. Pt declined stating " I do not want anymore medicine"

## 2023-02-07 NOTE — ED Notes (Signed)
PT HR went to [phone] notified RN Abby. RN is currently in room with the pt.

## 2023-02-07 NOTE — ED Notes (Signed)
Called RN Abby and notified pts PO was 88. RN states going to check on pt.

## 2023-02-07 NOTE — Progress Notes (Signed)
Time out performed for arterial line placement.

## 2023-02-07 NOTE — ED Notes (Signed)
Pt oxygen changed to 4L per NP after recent ABG results.

## 2023-02-07 NOTE — ED Notes (Signed)
Upon pt assessment, this nurse observed blood on the right arm. Iv was dislodged. Corky Crafts was changed. Blood pressure cuff size changed to smaller size.

## 2023-02-07 NOTE — Procedures (Signed)
Central Venous Catheter Insertion Procedure Note  Katie Moody  [id number]  11-30-1960  Date:02/07/23  Time:1:57 PM   Provider Performing:Linas Stepter   Procedure: Insertion of Non-tunneled Central Venous Catheter(36556) with US guidance (44010)   Indication(s) Medication administration  Consent Unable to obtain consent due to emergent nature of procedure.  Anesthesia Topical only with 1% lidocaine   Timeout Verified patient identification, verified procedure, site/side was marked, verified correct patient position, special equipment/implants available, medications/allergies/relevant history reviewed, required imaging and test results available.  Sterile Technique Maximal sterile technique including full sterile barrier drape, hand hygiene, sterile gown, sterile gloves, mask, hair covering, sterile ultrasound probe cover (if used).  Procedure Description Area of catheter insertion was cleaned with chlorhexidine and draped in sterile fashion.  With real-time ultrasound guidance a central venous catheter was placed into the left internal jugular vein. Nonpulsatile blood flow and easy flushing noted in all ports.  The catheter was sutured in place and sterile dressing applied.  Complications/Tolerance None; patient tolerated the procedure well. Chest X-ray is ordered to verify placement for internal jugular or subclavian cannulation.   Chest x-ray is not ordered for femoral cannulation.  EBL Minimal  Specimen(s) None  [name], MD [place] [facility] 02/07/2023 1:57 PM

## 2023-02-07 NOTE — Consult Note (Signed)
[facility] SURGICAL CONSULTATION NOTE (initial) - cpt: 99255    HISTORY OF PRESENT ILLNESS (HPI):  62 y.o. female presented to [facility] ED last evening for evaluation of abdominal distention and pain. Patient reports a weeklong history of gradually progressive abdominal distention, with obstipation and constipation.  She reports diminished appetite, followed by nausea and vomiting.  She denies fevers and chills.  Reports loose stools of late.  She reports she feels somewhat better than on admission.    Surgery is consulted by  ED physician Dr. Scotty Court in this context for evaluation and management of marked abdominal distention, possible typhlitis, AKI with dehydration, severe associated ileus.  PAST MEDICAL HISTORY (PMH):  Past Medical History:  Diagnosis Date   CAD (coronary artery disease)    Chronic diastolic CHF (congestive heart failure) (HCC)    COPD (chronic obstructive pulmonary disease) (HCC)    Hypertension      PAST SURGICAL HISTORY (PSH):  Past Surgical History:  Procedure Laterality Date   ABDOMINAL HYSTERECTOMY     LEFT HEART CATH AND CORONARY ANGIOGRAPHY N/A 06/04/2022   Procedure: LEFT HEART CATH AND CORONARY ANGIOGRAPHY;  Surgeon: Marykay Lex, MD;  Location: [facility] INVASIVE CV LAB;  Service: Cardiovascular;  Laterality: N/A;     MEDICATIONS:  Prior to Admission medications   Medication Sig Start Date End Date Taking? Authorizing Provider  aspirin EC 81 MG tablet Take 1 tablet (81 mg total) by mouth daily. Swallow whole. 07/20/22  Yes Furth, Cadence H, PA-C  atorvastatin (LIPITOR) 40 MG tablet Take 1 tablet (40 mg total) by mouth daily. 11/30/22  Yes Furth, Cadence H, PA-C  losartan (COZAAR) 50 MG tablet Take 1 tablet (50 mg total) by mouth daily. 12/23/22  Yes Furth, Cadence H, PA-C  meclizine (ANTIVERT) 25 MG tablet Take 1 tablet (25 mg total) by mouth 3 (three) times daily as needed for dizziness. 08/23/21  Yes Cuthriell, Delorise Royals, PA-C  PROVENTIL  HFA 108 (90 Base) MCG/ACT inhaler Inhale 2 puffs into the lungs every 4 (four) hours as needed for shortness of breath or wheezing. 12/22/21  Yes [provider]  umeclidinium-vilanterol (ANORO ELLIPTA) 62.5-25 MCG/ACT AEPB Inhale 1 puff into the lungs daily. 08/04/22  Yes Dgayli, Lianne Bushy, MD  metoprolol succinate (TOPROL-XL) 25 MG 24 hr tablet Take 1 tablet (25 mg total) by mouth daily. Patient not taking: Reported on 02/07/2023 06/09/22   Arnetha Courser, MD  Multiple Vitamin (MULTIVITAMIN WITH MINERALS) TABS tablet Take 1 tablet by mouth daily. Patient not taking: Reported on 02/07/2023 06/09/22   Arnetha Courser, MD     ALLERGIES:  No Known Allergies   SOCIAL HISTORY:  Social History   Socioeconomic History   Marital status: Single    Spouse name: Not on file   Number of children: 0   Years of education: Not on file   Highest education level: High school graduate  Occupational History   Not on file  Tobacco Use   Smoking status: Former    Current packs/day: 0.00    Average packs/day: 0.3 packs/day for 30.0 years (7.5 ttl pk-yrs)    Types: Cigarettes    Start date: 05/1992    Quit date: 05/2022    Years since quitting: 0.7   Smokeless tobacco: Never  Vaping Use   Vaping status: Never Used  Substance and Sexual Activity   Alcohol use: Yes    Comment: occasionally   Drug use: Never   Sexual activity: Not Currently    Birth control/protection: Post-menopausal  Other Topics Concern   Not on file  Social History Narrative   Not on file   Social Determinants of Health   Financial Resource Strain: Not on file  Food Insecurity: No Food Insecurity (02/09/2022)   Hunger Vital Sign    Worried About Running Out of Food in the Last Year: Never true    Ran Out of Food in the Last Year: Never true  Transportation Needs: No Transportation Needs (02/09/2022)   PRAPARE - Administrator, Civil Service (Medical): No    Lack of Transportation (Non-Medical): No  Physical  Activity: Not on file  Stress: Not on file  Social Connections: Not on file  Intimate Partner Violence: Not on file     FAMILY HISTORY:  Family History  Problem Relation Age of Onset   Heart attack Mother    Stroke Mother       REVIEW OF SYSTEMS:  Review of Systems  Unable to perform ROS: Acuity of condition  General: no fevers, chills, no body weight gain, has poor appetite, has fatigue HEENT: no blurry vision, hearing changes or sore throat Respiratory: no dyspnea, coughing, wheezing CV: no chest pain, no palpitations GI: has nausea, vomiting, abdominal pain, diarrhea, no constipation GU: no dysuria, burning on urination, increased urinary frequency, hematuria  Ext: no leg edema Neuro: no unilateral weakness, numbness, or tingling, no vision change or hearing loss Skin: no rash, no skin tear. MSK: No muscle spasm, no deformity, no limitation of range of movement in spin Heme: No easy bruising.  Travel history: No recent long distant travel.  VITAL SIGNS:  Temp:  [97.5 F (36.4 C)-99.1 F (37.3 C)] 98.1 F (36.7 C) (11/03 1035) Pulse Rate:  [72-117] 105 (11/03 1000) Resp:  [16-28] 28 (11/03 1035) BP: (64-114)/(26-82) 91/59 (11/03 1035) SpO2:  [90 %-100 %] 92 % (11/03 1035) Weight:  [60.6 kg-64.5 kg] 64.5 kg (11/03 1035)       Weight: 64.5 kg     INTAKE/OUTPUT:  11/02 0701 - 11/03 0700 In: 1649 [IV Piggyback:1649] Out: -   PHYSICAL EXAM:  Physical Exam Blood pressure (!) 91/59, pulse (!) 105, temperature 98.1 F (36.7 C), temperature source Oral, resp. rate (!) 28, weight 64.5 kg, SpO2 92%. Last Weight  Most recent update: 02/07/2023 10:36 AM    Weight  64.5 kg (142 lb 3.2 oz)             CONSTITUTIONAL: Well developed, and nourished, appropriately responsive and aware without distress.  Clearly tachypneic. EYES: Sclera non-icteric.   EARS, NOSE, MOUTH AND THROAT:  The oropharynx is clear. Oral mucosa is pink and moist.     Hearing is intact to voice.   NECK: Trachea is midline, and there is no jugular venous distension.  LYMPH NODES:  Lymph nodes in the neck are not enlarged. RESPIRATORY: No crackles, rhonchi or wheezing.  Presently tachypneic. CARDIOVASCULAR: Heart is regular in rate and rhythm.    GI: The abdomen is markedly distended, diffusely tender without guarding or rebound.  No evidence of peritonitis.  MUSCULOSKELETAL:  Symmetrical muscle tone appreciated in all four extremities. Warm without edema.  SKIN: Skin turgor is normal. No pathologic skin lesions appreciated.  NEUROLOGIC:  Motor and sensation appear grossly normal.  Cranial nerves are grossly without defect. PSYCH:  Alert and oriented to person, place and time. Affect is appropriate for situation.  Data Reviewed I have personally reviewed what is currently available of the patient's imaging, recent labs and medical records.  Labs:     Latest Ref Rng & Units 02/07/2023    3:39 AM 02/22/2023    5:46 PM 10/10/2022   12:49 PM  CBC  WBC 4.0 - 10.5 K/uL 8.5  9.9  5.2   Hemoglobin 12.0 - 15.0 g/dL 16.1  09.6  04.5   Hematocrit 36.0 - 46.0 % 36.7  40.6  41.3   Platelets 150 - 400 K/uL 343  377  281       Latest Ref Rng & Units 02/07/2023    3:39 AM 02/28/2023    5:46 PM 10/10/2022   12:49 PM  CMP  Glucose 70 - 99 mg/dL 409  811  96   BUN 8 - 23 mg/dL 88  87  13   Creatinine 0.44 - 1.00 mg/dL 9.14  7.82  9.56   Sodium 135 - 145 mmol/L 135  134  135   Potassium 3.5 - 5.1 mmol/L 3.8  3.3  4.0   Chloride 98 - 111 mmol/L 97  89  101   CO2 22 - 32 mmol/L 22  28  27    Calcium 8.9 - 10.3 mg/dL 6.9  8.2  9.0   Total Protein 6.5 - 8.1 g/dL  7.8  7.8   Total Bilirubin 0.3 - 1.2 mg/dL  3.1  0.9   Alkaline Phos 38 - 126 U/L  65  101   AST 15 - 41 U/L  33  21   ALT 0 - 44 U/L  14  19      Imaging studies:   Last 24 hrs: DG Abdomen 1 View  Result Date: 02/28/2023 CLINICAL DATA:  NG tube placement EXAM: ABDOMEN - 1 VIEW COMPARISON:  None Available. FINDINGS: NG tube is in  the mid stomach. Dilated bowel in the upper abdomen. Lung bases clear. IMPRESSION: NG tube in the mid stomach. Electronically Signed   By: Charlett Nose M.D.   On: 02/07/2023 23:21   CT ABDOMEN PELVIS WO CONTRAST  Result Date: 03/06/2023 CLINICAL DATA:  Sepsis. EXAM: CT ABDOMEN AND PELVIS WITHOUT CONTRAST TECHNIQUE: Multidetector CT imaging of the abdomen and pelvis was performed following the standard protocol without IV contrast. RADIATION DOSE REDUCTION: This exam was performed according to the departmental dose-optimization program which includes automated exposure control, adjustment of the mA and/or kV according to patient size and/or use of iterative reconstruction technique. COMPARISON:  None Available. FINDINGS: Lower chest: The visualized lung bases are clear. There is coronary vascular calcification. No intra-abdominal free air or free fluid. Hepatobiliary: The liver is unremarkable. No biliary dilatation. The gallbladder is contracted. No calcified gallstone. Pancreas: Unremarkable. No pancreatic ductal dilatation or surrounding inflammatory changes. Spleen: Normal in size without focal abnormality. Adrenals/Urinary Tract: The left adrenal glands unremarkable. There is a 12 mm indeterminate right adrenal nodule. There is no hydronephrosis or nephrolithiasis on either side. The visualized ureters appear unremarkable. The urinary bladder is collapsed. Stomach/Bowel: There is mild thickened appearance of the wall of the cecum and ascending colon with intramural gas concerning for pneumatosis and suggestive of a necrotizing inflammatory process or typhlitis. Clinical correlation is recommended. Several mildly dilated loops of small bowel measure up to 3.5 cm in caliber, likely reactive ileus. No discrete transition noted to suggest obstruction. There is sigmoid diverticulosis and several scattered colonic diverticula. The appendix is normal. Vascular/Lymphatic: Advanced aortoiliac atherosclerotic disease.  The IVC is unremarkable. No portal venous gas. There is no adenopathy. Reproductive: Hysterectomy.  No suspicious adnexal masses. Other: None Musculoskeletal: Disc desiccation  and vacuum phenomena at L5-S1. No acute osseous pathology. IMPRESSION: 1. 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 Clinical correlation is recommended. No portal venous gas or pneumoperitoneum. 2. Mildly dilated small bowel loops, likely reactive ileus. 3. Sigmoid diverticulosis. Normal appendix. 4.  Aortic Atherosclerosis (ICD10-I70.0). These results were called by telephone at the time of interpretation on 03/01/2023 at 10:09 pm to provider PHILLIP STAFFORD , who verbally acknowledged these results. Electronically Signed   By: Elgie Collard M.D.   On: 03/05/2023 22:12     Assessment/Plan:  62 y.o. female with marked abdominal distention, right sided colonic distention with pneumatosis, associated ileus, AKI.  Admitted with lactic acidosis, now resolved over night with hydration.  Currently hypotensive on vasopressor.  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  -Agree with broad-spectrum antibiotic coverage, bowel rest, NG tube decompression.  -Continue fluid resuscitation, vasopressors as needed.    - DVT and PPI prophylaxis  -Will continue to observe closely with serial exams, labs.  -Discussed at length with patient uncertainty regarding her need for eventual operative intervention.  At present time she remains hopeful that antibiotics and bowel rest will be sufficient, I was forthcoming regarding my concerns that we will eventually need to proceed with exploratory laparotomy and possible extensive colonic resection.  I believe if she understands she remains at risk for further decompensation, and advised that when she is ready/has peace with proceeding with surgery to reach out through nursing staff.  In the interim I remain readily available for this challenging diagnostic dilemma.  All of the above findings and recommendations were discussed with the patient, and all of patient's questions were answered to their expressed satisfaction.  Thank you for the opportunity to participate in this patient's care.   -- Campbell Lerner, M.D., FACS 02/07/2023, 10:40 AM

## 2023-02-08 ENCOUNTER — Other Ambulatory Visit: Payer: Self-pay

## 2023-02-08 ENCOUNTER — Encounter: Payer: Self-pay | Admitting: Internal Medicine

## 2023-02-08 ENCOUNTER — Inpatient Hospital Stay: Payer: Medicaid Other

## 2023-02-08 ENCOUNTER — Ambulatory Visit: Payer: Medicaid Other

## 2023-02-08 ENCOUNTER — Inpatient Hospital Stay: Payer: Medicaid Other | Admitting: Certified Registered"

## 2023-02-08 ENCOUNTER — Encounter: Admission: EM | Disposition: E | Payer: Self-pay | Source: Home / Self Care | Attending: Internal Medicine

## 2023-02-08 DIAGNOSIS — K6389 Other specified diseases of intestine: Secondary | ICD-10-CM

## 2023-02-08 DIAGNOSIS — I2510 Atherosclerotic heart disease of native coronary artery without angina pectoris: Secondary | ICD-10-CM

## 2023-02-08 DIAGNOSIS — I5032 Chronic diastolic (congestive) heart failure: Secondary | ICD-10-CM

## 2023-02-08 DIAGNOSIS — K56609 Unspecified intestinal obstruction, unspecified as to partial versus complete obstruction: Secondary | ICD-10-CM | POA: Diagnosis not present

## 2023-02-08 DIAGNOSIS — N179 Acute kidney failure, unspecified: Secondary | ICD-10-CM | POA: Diagnosis not present

## 2023-02-08 DIAGNOSIS — K624 Stenosis of anus and rectum: Secondary | ICD-10-CM

## 2023-02-08 DIAGNOSIS — K56699 Other intestinal obstruction unspecified as to partial versus complete obstruction: Secondary | ICD-10-CM

## 2023-02-08 HISTORY — PX: LAPAROTOMY: SHX154

## 2023-02-08 LAB — BASIC METABOLIC PANEL
Anion gap: 16 — ABNORMAL HIGH (ref 5–15)
Anion gap: 14 (ref 5–15)
BUN: 103 mg/dL — ABNORMAL HIGH (ref 8–23)
BUN: 98 mg/dL — ABNORMAL HIGH (ref 8–23)
CO2: 20 mmol/L — ABNORMAL LOW (ref 22–32)
CO2: 25 mmol/L (ref 22–32)
Calcium: 5.2 mg/dL — CL (ref 8.9–10.3)
Calcium: 5 mg/dL — CL (ref 8.9–10.3)
Chloride: 99 mmol/L (ref 98–111)
Chloride: 94 mmol/L — ABNORMAL LOW (ref 98–111)
Creatinine, Ser: 4.19 mg/dL — ABNORMAL HIGH (ref 0.44–1.00)
Creatinine, Ser: 4.34 mg/dL — ABNORMAL HIGH (ref 0.44–1.00)
GFR, Estimated: 11 mL/min — ABNORMAL LOW (ref >60)
GFR, Estimated: 11 mL/min — ABNORMAL LOW (ref >60)
Glucose, Bld: 204 mg/dL — ABNORMAL HIGH (ref 70–99)
Glucose, Bld: 195 mg/dL — ABNORMAL HIGH (ref 70–99)
Potassium: 4.8 mmol/L (ref 3.5–5.1)
Potassium: 4.4 mmol/L (ref 3.5–5.1)
Sodium: 135 mmol/L (ref 135–145)
Sodium: 133 mmol/L — ABNORMAL LOW (ref 135–145)

## 2023-02-08 LAB — CBC
HCT: 27.8 % — ABNORMAL LOW (ref 36.0–46.0)
Hemoglobin: 8.9 g/dL — ABNORMAL LOW (ref 12.0–15.0)
MCH: 30.7 pg (ref 26.0–34.0)
MCHC: 32 g/dL (ref 30.0–36.0)
MCV: 95.9 fL (ref 80.0–100.0)
Platelets: 266 10*3/uL (ref 150–400)
RBC: 2.9 MIL/uL — ABNORMAL LOW (ref 3.87–5.11)
RDW: 12.6 % (ref 11.5–15.5)
WBC: 12.2 10*3/uL — ABNORMAL HIGH (ref 4.0–10.5)
nRBC: 0 % (ref 0.0–0.2)

## 2023-02-08 LAB — BLOOD GAS, ARTERIAL
Acid-base deficit: 12.5 mmol/L — ABNORMAL HIGH (ref 0.0–2.0)
Acid-base deficit: 7.4 mmol/L — ABNORMAL HIGH (ref 0.0–2.0)
Bicarbonate: 16.9 mmol/L — ABNORMAL LOW (ref 20.0–28.0)
Bicarbonate: 20.8 mmol/L (ref 20.0–28.0)
FIO2: 45 %
FIO2: 60 %
MECHVT: 456 mL
MECHVT: 450 mL
Mechanical Rate: 16
Mechanical Rate: 18
O2 Saturation: 90.4 %
O2 Saturation: 97.9 %
PEEP: 5 cmH2O
PEEP: 5 cmH2O
Patient temperature: 37
Patient temperature: 37
RATE: 18 {breaths}/min
pCO2 arterial: 52 mm[Hg] — ABNORMAL HIGH (ref 32–48)
pCO2 arterial: 52 mm[Hg] — ABNORMAL HIGH (ref 32–48)
pH, Arterial: 7.12 — CL (ref 7.35–7.45)
pH, Arterial: 7.21 — ABNORMAL LOW (ref 7.35–7.45)
pO2, Arterial: 62 mm[Hg] — ABNORMAL LOW (ref 83–108)
pO2, Arterial: 92 mm[Hg] (ref 83–108)

## 2023-02-08 LAB — LACTIC ACID, PLASMA
Lactic Acid, Venous: 2.4 mmol/L (ref 0.5–1.9)
Lactic Acid, Venous: 2.7 mmol/L (ref 0.5–1.9)
Lactic Acid, Venous: 2.7 mmol/L (ref 0.5–1.9)
Lactic Acid, Venous: 4 mmol/L (ref 0.5–1.9)
Lactic Acid, Venous: 4.6 mmol/L (ref 0.5–1.9)

## 2023-02-08 LAB — ABO/RH: ABO/RH(D): B POS

## 2023-02-08 LAB — CBC WITH DIFFERENTIAL/PLATELET
Abs Immature Granulocytes: 0.22 10*3/uL — ABNORMAL HIGH (ref 0.00–0.07)
Basophils Absolute: 0 10*3/uL (ref 0.0–0.1)
Basophils Relative: 0 %
Eosinophils Absolute: 0 10*3/uL (ref 0.0–0.5)
Eosinophils Relative: 1 %
HCT: 25.1 % — ABNORMAL LOW (ref 36.0–46.0)
Hemoglobin: 8.1 g/dL — ABNORMAL LOW (ref 12.0–15.0)
Immature Granulocytes: 3 %
Lymphocytes Relative: 8 %
Lymphs Abs: 0.7 10*3/uL (ref 0.7–4.0)
MCH: 31.2 pg (ref 26.0–34.0)
MCHC: 32.3 g/dL (ref 30.0–36.0)
MCV: 96.5 fL (ref 80.0–100.0)
Monocytes Absolute: 0.8 10*3/uL (ref 0.1–1.0)
Monocytes Relative: 9 %
Neutro Abs: 6.9 10*3/uL (ref 1.7–7.7)
Neutrophils Relative %: 79 %
Platelets: 224 10*3/uL (ref 150–400)
RBC: 2.6 MIL/uL — ABNORMAL LOW (ref 3.87–5.11)
RDW: 12.6 % (ref 11.5–15.5)
Smear Review: NORMAL
WBC: 8.6 10*3/uL (ref 4.0–10.5)
nRBC: 0 % (ref 0.0–0.2)

## 2023-02-08 LAB — HEPATIC FUNCTION PANEL
ALT: 61 U/L — ABNORMAL HIGH (ref 0–44)
AST: 246 U/L — ABNORMAL HIGH (ref 15–41)
Albumin: 1.8 g/dL — ABNORMAL LOW (ref 3.5–5.0)
Alkaline Phosphatase: 66 U/L (ref 38–126)
Bilirubin, Direct: 0.8 mg/dL — ABNORMAL HIGH (ref 0.0–0.2)
Indirect Bilirubin: 0.8 mg/dL (ref 0.3–0.9)
Total Bilirubin: 1.6 mg/dL — ABNORMAL HIGH (ref <1.2)
Total Protein: 3.8 g/dL — ABNORMAL LOW (ref 6.5–8.1)

## 2023-02-08 LAB — PHOSPHORUS: Phosphorus: 8.4 mg/dL — ABNORMAL HIGH (ref 2.5–4.6)

## 2023-02-08 LAB — PREPARE RBC (CROSSMATCH)

## 2023-02-08 LAB — HEMOGLOBIN AND HEMATOCRIT, BLOOD
HCT: 22.1 % — ABNORMAL LOW (ref 36.0–46.0)
HCT: 27.4 % — ABNORMAL LOW (ref 36.0–46.0)
Hemoglobin: 7.1 g/dL — ABNORMAL LOW (ref 12.0–15.0)
Hemoglobin: 9 g/dL — ABNORMAL LOW (ref 12.0–15.0)

## 2023-02-08 LAB — GLUCOSE, CAPILLARY
Glucose-Capillary: 205 mg/dL — ABNORMAL HIGH (ref 70–99)
Glucose-Capillary: 115 mg/dL — ABNORMAL HIGH (ref 70–99)
Glucose-Capillary: 103 mg/dL — ABNORMAL HIGH (ref 70–99)
Glucose-Capillary: 148 mg/dL — ABNORMAL HIGH (ref 70–99)
Glucose-Capillary: 114 mg/dL — ABNORMAL HIGH (ref 70–99)

## 2023-02-08 LAB — PROCALCITONIN: Procalcitonin: 19.58 ng/mL

## 2023-02-08 LAB — MAGNESIUM: Magnesium: 2.2 mg/dL (ref 1.7–2.4)

## 2023-02-08 LAB — CALCIUM: Calcium: 4.5 mg/dL — CL (ref 8.9–10.3)

## 2023-02-08 SURGERY — LAPAROTOMY, EXPLORATORY
Anesthesia: General

## 2023-02-08 MED ORDER — VASOPRESSIN 20 UNIT/ML IV SOLN
INTRAVENOUS | Status: DC | PRN
  Administered 2023-02-08 (×2): 1 [IU] via INTRAVENOUS
  Administered 2023-02-08 (×2): 2 [IU] via INTRAVENOUS
  Administered 2023-02-08 (×5): 1 [IU] via INTRAVENOUS
  Administered 2023-02-08: 2 [IU] via INTRAVENOUS
  Administered 2023-02-08 (×2): 1 [IU] via INTRAVENOUS
  Administered 2023-02-08: 2 [IU] via INTRAVENOUS
  Administered 2023-02-08 (×2): 1 [IU] via INTRAVENOUS

## 2023-02-08 MED ORDER — MIDAZOLAM HCL 2 MG/2ML IJ SOLN: 1.0000 mg | INTRAMUSCULAR | Status: DC | PRN

## 2023-02-08 MED ORDER — BISACODYL 10 MG RE SUPP: 10.0000 mg | Freq: Every day | RECTAL | Status: DC | PRN

## 2023-02-08 MED ORDER — PIPERACILLIN-TAZOBACTAM IN DEX 2-0.25 GM/50ML IV SOLN
2.2500 g | Freq: Three times a day (TID) | INTRAVENOUS | Status: DC
  Administered 2023-02-08 – 2023-02-09 (×3): 2.25 g via INTRAVENOUS
  Filled 2023-02-08 (×5): qty 50

## 2023-02-08 MED ORDER — CALCIUM GLUCONATE-NACL 1-0.675 GM/50ML-% IV SOLN
1.0000 g | Freq: Once | INTRAVENOUS | Status: AC
  Administered 2023-02-08: 1000 mg via INTRAVENOUS
  Filled 2023-02-08: qty 50

## 2023-02-08 MED ORDER — ROCURONIUM BROMIDE 10 MG/ML (PF) SYRINGE
50.0000 mg | PREFILLED_SYRINGE | Freq: Once | INTRAVENOUS | Status: AC
  Administered 2023-02-08: 50 mg via INTRAVENOUS
  Filled 2023-02-08: qty 10

## 2023-02-08 MED ORDER — LACTATED RINGERS IV SOLN: INTRAVENOUS | Status: DC | PRN

## 2023-02-08 MED ORDER — FENTANYL CITRATE (PF) 100 MCG/2ML IJ SOLN
100.0000 ug | Freq: Once | INTRAMUSCULAR | Status: AC
  Administered 2023-02-08: 100 ug via INTRAVENOUS
  Filled 2023-02-08: qty 2

## 2023-02-08 MED ORDER — CHLORHEXIDINE GLUCONATE CLOTH 2 % EX PADS
6.0000 | MEDICATED_PAD | Freq: Every day | CUTANEOUS | Status: DC
  Administered 2023-02-08: 6 via TOPICAL

## 2023-02-08 MED ORDER — SODIUM CHLORIDE 0.9% IV SOLUTION: Freq: Once | INTRAVENOUS | Status: AC

## 2023-02-08 MED ORDER — FENTANYL CITRATE (PF) 100 MCG/2ML IJ SOLN
INTRAMUSCULAR | Status: AC
  Filled 2023-02-08: qty 2

## 2023-02-08 MED ORDER — SODIUM BICARBONATE 8.4 % IV SOLN
INTRAVENOUS | Status: DC
  Filled 2023-02-08: qty 150
  Filled 2023-02-08 (×4): qty 1000
  Filled 2023-02-08: qty 150
  Filled 2023-02-08: qty 1000

## 2023-02-08 MED ORDER — FENTANYL CITRATE PF 50 MCG/ML IJ SOSY
50.0000 ug | PREFILLED_SYRINGE | INTRAMUSCULAR | Status: DC | PRN
  Administered 2023-02-08: 50 ug via INTRAVENOUS
  Filled 2023-02-08: qty 1

## 2023-02-08 MED ORDER — VASOPRESSIN 20 UNIT/ML IV SOLN
INTRAVENOUS | Status: AC
  Filled 2023-02-08: qty 1

## 2023-02-08 MED ORDER — EPINEPHRINE 1 MG/10ML IJ SOSY
1.0000 mg | PREFILLED_SYRINGE | Freq: Once | INTRAMUSCULAR | Status: AC
  Administered 2023-02-08: 1 mg via INTRAVENOUS
  Filled 2023-02-08: qty 10

## 2023-02-08 MED ORDER — SODIUM BICARBONATE 8.4 % IV SOLN
100.0000 meq | Freq: Once | INTRAVENOUS | Status: AC
  Administered 2023-02-08: 100 meq via INTRAVENOUS
  Filled 2023-02-08: qty 50

## 2023-02-08 MED ORDER — EPINEPHRINE PF 1 MG/ML IJ SOLN
INTRAMUSCULAR | Status: DC | PRN
  Administered 2023-02-08: .05 mg via INTRAVENOUS
  Administered 2023-02-08 (×2): .02 mg via INTRAVENOUS
  Administered 2023-02-08: .05 mg via INTRAVENOUS
  Administered 2023-02-08: .015 mg via INTRAVENOUS

## 2023-02-08 MED ORDER — ALBUMIN HUMAN 5 % IV SOLN: INTRAVENOUS | Status: DC | PRN

## 2023-02-08 MED ORDER — THIAMINE HCL 100 MG/ML IJ SOLN
100.0000 mg | Freq: Every day | INTRAMUSCULAR | Status: DC
  Administered 2023-02-08 – 2023-02-09 (×2): 100 mg via INTRAVENOUS
  Filled 2023-02-08 (×2): qty 2

## 2023-02-08 MED ORDER — EPINEPHRINE HCL 5 MG/250ML IV SOLN IN NS
0.5000 ug/min | INTRAVENOUS | Status: DC
  Administered 2023-02-08: 0.5 ug/min via INTRAVENOUS
  Filled 2023-02-08: qty 250

## 2023-02-08 MED ORDER — ACETAMINOPHEN 10 MG/ML IV SOLN
1000.0000 mg | Freq: Four times a day (QID) | INTRAVENOUS | Status: DC
  Administered 2023-02-08: 1000 mg via INTRAVENOUS
  Filled 2023-02-08 (×3): qty 100

## 2023-02-08 MED ORDER — ALBUMIN HUMAN 5 % IV SOLN
INTRAVENOUS | Status: AC
  Filled 2023-02-08: qty 500

## 2023-02-08 MED ORDER — ROCURONIUM BROMIDE 100 MG/10ML IV SOLN
INTRAVENOUS | Status: DC | PRN
  Administered 2023-02-08: 40 mg via INTRAVENOUS

## 2023-02-08 MED ORDER — FENTANYL CITRATE PF 50 MCG/ML IJ SOSY: 50.0000 ug | PREFILLED_SYRINGE | INTRAMUSCULAR | Status: DC | PRN

## 2023-02-08 MED ORDER — SODIUM CHLORIDE 0.9 % IV BOLUS
1000.0000 mL | Freq: Once | INTRAVENOUS | Status: AC
  Administered 2023-02-08: 1000 mL via INTRAVENOUS

## 2023-02-08 MED ORDER — POLYETHYLENE GLYCOL 3350 17 G PO PACK: 17.0000 g | PACK | Freq: Every day | ORAL | Status: DC

## 2023-02-08 MED ORDER — 0.9 % SODIUM CHLORIDE (POUR BTL) OPTIME
TOPICAL | Status: DC | PRN
  Administered 2023-02-08: 500 mL

## 2023-02-08 MED ORDER — ETOMIDATE 2 MG/ML IV SOLN
10.0000 mg | Freq: Once | INTRAVENOUS | Status: AC
  Administered 2023-02-08: 10 mg via INTRAVENOUS
  Filled 2023-02-08: qty 10

## 2023-02-08 MED ORDER — DOCUSATE SODIUM 50 MG/5ML PO LIQD: 100.0000 mg | Freq: Two times a day (BID) | ORAL | Status: DC

## 2023-02-08 MED ORDER — SODIUM BICARBONATE 8.4 % IV SOLN
INTRAVENOUS | Status: AC
  Filled 2023-02-08: qty 50

## 2023-02-08 MED ORDER — PANTOPRAZOLE SODIUM 40 MG IV SOLR
40.0000 mg | Freq: Every day | INTRAVENOUS | Status: DC
  Administered 2023-02-08: 40 mg via INTRAVENOUS
  Filled 2023-02-08: qty 10

## 2023-02-08 MED ORDER — FENTANYL CITRATE (PF) 100 MCG/2ML IJ SOLN
INTRAMUSCULAR | Status: DC | PRN
  Administered 2023-02-08: 50 ug via INTRAVENOUS

## 2023-02-08 SURGICAL SUPPLY — 39 items
APL PRP STRL LF DISP 70% ISPRP (MISCELLANEOUS) ×1
APPLIER CLIP 11 MED OPEN (CLIP)
APPLIER CLIP 13 LRG OPEN (CLIP)
APR CLP LRG 13 20 CLIP (CLIP)
APR CLP MED 11 20 MLT OPN (CLIP)
CHLORAPREP W/TINT 26 (MISCELLANEOUS) ×1 IMPLANT
CLIP APPLIE 11 MED OPEN (CLIP) IMPLANT
CLIP APPLIE 13 LRG OPEN (CLIP) IMPLANT
DRAPE C-SECTION (MISCELLANEOUS) ×1 IMPLANT
ELECT BLADE 6.5 EXT (BLADE) ×1 IMPLANT
ELECT CAUTERY BLADE 6.4 (BLADE) ×1 IMPLANT
ELECT REM PT RETURN 9FT ADLT (ELECTROSURGICAL) ×1
ELECTRODE REM PT RTRN 9FT ADLT (ELECTROSURGICAL) ×1 IMPLANT
GAUZE 4X4 16PLY ~~LOC~~+RFID DBL (SPONGE) ×1 IMPLANT
GLOVE ORTHO TXT STRL SZ7.5 (GLOVE) ×2 IMPLANT
GOWN STRL REUS W/ TWL LRG LVL3 (GOWN DISPOSABLE) ×2 IMPLANT
GOWN STRL REUS W/ TWL XL LVL3 (GOWN DISPOSABLE) ×2 IMPLANT
GOWN STRL REUS W/TWL LRG LVL3 (GOWN DISPOSABLE) ×2
GOWN STRL REUS W/TWL XL LVL3 (GOWN DISPOSABLE) ×2
HANDLE YANKAUER SUCT BULB TIP (MISCELLANEOUS) IMPLANT
KIT OSTOMY 2 PC DRNBL 2.25 STR (WOUND CARE) IMPLANT
LIGASURE IMPACT 36 18CM CVD LR (INSTRUMENTS) IMPLANT
MANIFOLD NEPTUNE II (INSTRUMENTS) ×1 IMPLANT
NS IRRIG 1000ML POUR BTL (IV SOLUTION) ×1 IMPLANT
PACK BASIN MAJOR ARMC (MISCELLANEOUS) ×1 IMPLANT
PACK COLON CLEAN CLOSURE (MISCELLANEOUS) IMPLANT
RELOAD PROXIMATE 75MM BLUE (ENDOMECHANICALS) ×1 IMPLANT
RELOAD STAPLE 75 3.8 BLU REG (ENDOMECHANICALS) IMPLANT
SPONGE T-LAP 18X18 ~~LOC~~+RFID (SPONGE) ×4 IMPLANT
STAPLER PROXIMATE 75MM BLUE (STAPLE) IMPLANT
STAPLER SKIN PROX 35W (STAPLE) ×1 IMPLANT
SUT PDS AB 0 CT1 27 (SUTURE) IMPLANT
SUT PDS AB 1 CT 36 (SUTURE) ×3 IMPLANT
SUT SILK 3-0 (SUTURE) ×1 IMPLANT
SUT VICRYL 2-0 54IN ABS (SUTURE) IMPLANT
SUT VICRYL 3-0 CR8 SH (SUTURE) IMPLANT
TRAP FLUID SMOKE EVACUATOR (MISCELLANEOUS) ×1 IMPLANT
TRAY FOLEY MTR SLVR 16FR STAT (SET/KITS/TRAYS/PACK) ×1 IMPLANT
WATER STERILE IRR 500ML POUR (IV SOLUTION) ×1 IMPLANT

## 2023-02-08 NOTE — Op Note (Signed)
Subtotal colectomy with end ileostomy.  Pre-operative Diagnosis: Pneumatosis coli, abdominal sepsis.  Post-operative Diagnosis: same.    Surgeon: Campbell Lerner, M.D., [facility]  Anesthesia: General Endotracheal  Findings: Distended right and transverse colon, thin-walled, malodorous peritoneal fluid, without pus, without perforation.  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  Estimated Blood Loss: 50 mL         Specimens: Distal ileum, right transverse and left colon, sigmoid colon with division at rectosigmoid, distal to densely scarred and stenotic rectosigmoid junction.          Complications: none, patient's status remains critical, on maximum vasopressor support             Procedure Details  The patient was seen again in her ICU room. The benefits, complications, treatment options, and expected outcomes had been discussed with the patient, prior to her intubation. The risks of bleeding, infection, recurrence of symptoms, failure to resolve symptoms, unanticipated injury, prosthetic placement, prosthetic infection, any of which could require further surgery were reviewed with the patient. The likelihood of improving the patient's symptoms with return to their baseline status is hopeful.  The patient and/or family concurred with the proposed plan, giving informed consent.  The patient was taken to Operating Room, identified and the procedure verified.    Prior to the induction of general anesthesia, antibiotic were confirmed to be current. VTE prophylaxis was in place.  General anesthesia was then administered and tolerated with vasopressor support. After the induction, the patient was positioned in the supine position and the abdomen was prepped with  Chloraprep and draped in the sterile fashion.  A Time Out was held and the above information confirmed.  A midline incision is made and extended via the linea alba due to the marked distention of the  patient's abdomen.  The peritoneum was carefully entered.  There was malodorous peritoneal fluid on entry.  The right colon and transverse colon were markedly dilated and thinned filled with liquid and gas.  After initial mobilization of the right colon by taking down [employer], a small spontaneous perforation occurred in the transverse colon, but with minimal stool spillage the opening was closed with 3-0 silk, containing the contamination.  We continued to mobilize the right colon.  I created a mesenteric window at the distal ileum and began to divide the right colonic mesentery with the large jawed LigaSure.  In like manner we continued to mobilize the transverse colon mobilizing the overlying omentum, identifying the duodenum.  I then knew would be necessary to take the splenic flexure and left colon.  The left colon appeared to be pink and nondistended.  However on palpation the rectosigmoid colon was very dense thickened and either secondary to an elongated neoplasm, or perhaps chronic inflammatory changes from diverticulitis rectosigmoid colitis.  I know I could not leave a long pouch proximal to an area of stricture, and felt we would have to take the entire left colon and rectosigmoid as well.  So on like manner the left colon was mobilized from lateral to medial.  Blunt and sharp dissection was utilized to mobilize the rectosigmoid junction.  The mesocolon was divided again using the large jaw LigaSure maintaining excellent hemostasis continuing around the splenic flexure from the transverse colon, dividing the left colonic vasculature and mobilizing the rectosigmoid hooking it closely to avoid retroperitoneal structures.  Once this was adequately mobilized, I was able to divide the proximal rectum with a 75 mm GIA.  I then divided  the distal ileum with the same.  This delivered our specimen. We then confirmed adequate hemostasis, irrigated the abdominal cavity with multiple aliquots, 2 L of warm normal  saline.  I then created a small opening through the right rectus muscle about the umbilical area.  I brought the distal ileum out through this for eventual ileostomy.  We then closed the midline of the abdomen, reapproximating the linea alba with a running 0 PDS.  Then approximated the skin with staples and applied honeycomb dressing prior to maturing the ileostomy with 3-0 Vicryl's. Appliance was fashioned for the ileostomy and applied.  Patient was subsequently transferred off the operative table to her ICU bed and returned to the ICU in guarded condition.      Campbell Lerner M.D., [facility] 03/04/2023 3:44 AM

## 2023-02-08 NOTE — Progress Notes (Signed)
Pt taken to CT scan by this RN and RT.

## 2023-02-08 NOTE — Progress Notes (Signed)
NP Annabelle Harman notified of lactid 4.6. see new orders.

## 2023-02-08 NOTE — Progress Notes (Signed)
In patients room , upon rounds noticed midline incision oozing, gown saturated in blood, Dr Claudine Mouton notiifed. No new orders received

## 2023-02-08 NOTE — Procedures (Addendum)
INTUBATION PROCEDURE NOTE  Patient Name: Katie Moody   MRN: [id number]   Date of Birth/ Sex: 05/13/1960 , female      Admission Date: 02/21/2023  Attending Provider: [name], MD  Primary Diagnosis: Pneumatosis of intestines   Date:02/28/2023  Time:1:01 AM   Provider Performing Procedure:Hashir Deleeuw A Kathrynn Backstrom DNP, AGACNP  Procedure: Intubation (31500)  Indication(s) Respiratory Failure Pre-procedure  Consent Risks of the procedure as well as the alternatives and risks of each were explained to the patient and/or caregiver.  Consent for the procedure was obtained and is signed in the bedside chart  Anesthesia Etomidate, Fentanyl, and Rocuronium  Time Out Verified patient identification, verified procedure, site/side was marked, verified correct patient position, special equipment/implants available, medications/allergies/relevant history reviewed, required imaging and test results available.  Sterile Technique Usual hand hygeine, masks, and gloves were used  Procedure Description Patient positioned in bed supine.  Sedation given as noted above.  Patient was intubated with endotracheal tube using Glidesope # 3 and taped 23cm @ the Lip.  View was Grade 1 full glottis .  Number of attempts was 1.  Colorimetric CO2 detector was consistent with tracheal placement.  Complications/Tolerance None; patient tolerated the procedure well. Chest X-ray is ordered to verify placement.  EBL Minimal  Specimen(s) None   Webb Silversmith, DNP, CCRN, FNP-C, AGACNP-BC Acute Care & Family Nurse Practitioner  [facility]  See Amion for personal pager PCCM on call pager [phone] until 7 am

## 2023-02-08 NOTE — Procedures (Signed)
Arterial Catheter Insertion Procedure Note  Myangel Summons  [id number]  1960/08/21  Date:02/05/2023  Time:5:37 AM   Provider Performing: Loraine Leriche   Procedure: Insertion of Arterial Line (40981) with US guidance (19147)   Indication(s) Blood pressure monitoring and/or need for frequent ABGs  Consent Unable to obtain consent due to emergent nature of procedure.  Anesthesia None  Time Out Verified patient identification, verified procedure, site/side was marked, verified correct patient position, special equipment/implants available, medications/allergies/relevant history reviewed, required imaging and test results available.  Sterile Technique Maximal sterile technique including full sterile barrier drape, hand hygiene, sterile gown, sterile gloves, mask, hair covering, sterile ultrasound probe cover (if used).  Procedure Description Area of catheter insertion was cleaned with chlorhexidine and draped in sterile fashion. With real-time ultrasound guidance an arterial catheter was placed into the left femoral artery.  Appropriate arterial tracings confirmed on monitor.    Complications/Tolerance None; patient tolerated the procedure well.  EBL Minimal  Specimen(s) None   Webb Silversmith, DNP, CCRN, FNP-C, AGACNP-BC Acute Care & Family Nurse Practitioner  [facility]  See Amion for personal pager PCCM on call pager [phone] until 7 am

## 2023-02-08 NOTE — Progress Notes (Signed)
Brief Note Asked to see patient again given bloody drainage from midline incision and from end ileostomy site. She remains intubated and sedated. RN staff at bedside. She has been able to wean from vasopressor support throughout the day. Currently on 36 mcg/min levophed. Vasopressin stopped currently but likely restarting. She is mildly tachycardic to 110 bpm at bedside.   On examination, honeycomb saturated with blood to inferior portion. This was removed. No evidence of active bleeding or oozing on examination of the wound. Wound left uncovered for a few minutes to observe. End ileostomy is quite dusky, with old blood in bag. Appliance removed. Ostomy digitized and patent beyond the level of the fascia. New appliance applied. Pressure dressing applied to inferior portion of the laparotomy wound  Agree with transfusion of 1-2 units pRBCs Hold anticoagulation Monitor H&H Monitor hemodynamics; appreciate PCCM assistance with ventilator and vasopressor support No emergent evidence for surgical re-intervention, will follow closely  D/W Dr Claudine Mouton   -- Lynden Oxford, PA-C [facility] 02/17/2023, 4:03 PM M-F: 7am - 4pm

## 2023-02-08 NOTE — Progress Notes (Signed)
NAME:  Katie Moody, MRN:  [id number], DOB:  08/17/1960, LOS: 2 ADMISSION DATE:  02/10/2023, CONSULTATION DATE: 02/07/2023 REFERRING MD: Dr. Mayford Knife, CHIEF COMPLAINT: Sepsis    History of Present Illness:  Patient is a 62 year old female presenting to the hospital with abdominal pain. She was found to have pneumatosis intestinalis and is admitted to for further management.   Patient presented today with abdominal pain of a week's duration. She was noted to have diffuse and constant abdominal pain with a distended abdomen. She reported nausea and several episodes of non-bilious and non-blood vomiting. She had no fevers or chills. On presentation, she was noted to be hypotensive, with improvement following fluid resuscitation.   Imaging in the ED included a CT scan of the abdomen and pelvis which was notable for pneumatosis of the cecum and ascending colon, concerning for typhlitis. There was also associated ileus. She was started on IV antibiotics and admitted. Overnight, blood pressure remained soft. PCCM consulted for initiation of vasopressors for management of septic shock.  Pertinent  Medical History  CAD HFpEF HLD HTN COPD on Anoro  Significant Hospital Events: Including procedures, antibiotic start and stop dates in addition to other pertinent events   02/26/2023: admit to [facility], CT with pneumatosis 02/07/2023: transfer to ICU, increased pressor requirements. CVC and aline placed 03/03/2023: Pt decompensated overnight with worsening septic shock, acute respiratory failure, and severe metabolic acidosis requiring mechanical intubation and multiple vasopressor support.  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  Postop pt remained mechanically intubated requiring  levophed/vasopressin/epinephrine/sodium bicarb gtts.     Micro Data:   11/03: MRSA PCR>>negative  11/03: Cdiff>> 11/04: Blood x2>>negative   Anti-infectives (From admission, onward)    Start     Dose/Rate Route Frequency Ordered Stop   02/05/2023 1400  piperacillin-tazobactam (ZOSYN) IVPB 2.25 g        2.25 g 100 mL/hr over 30 Minutes Intravenous Every 8 hours 02/11/2023 0645     02/19/2023 0400  voriconazole (VFEND) 240 mg in sodium chloride 0.9 % 100 mL IVPB       Placed in "Followed by" Linked Group   4 mg/kg  60.6 kg 62 mL/hr over 120 Minutes Intravenous Every 12 hours 02/07/23 0149     02/07/23 0400  piperacillin-tazobactam (ZOSYN) IVPB 3.375 g        3.375 g 12.5 mL/hr over 240 Minutes Intravenous Every 8 hours 02/18/2023 2354 02/17/2023 1200   02/07/23 0400  voriconazole (VFEND) 360 mg in sodium chloride 0.9 % 100 mL IVPB       Placed in "Followed by" Linked Group   6 mg/kg  60.6 kg 68 mL/hr over 120 Minutes Intravenous Every 12 hours 02/07/23 0149 02/07/23 1900   02/25/2023 2130  piperacillin-tazobactam (ZOSYN) IVPB 3.375 g        3.375 g 100 mL/hr over 30 Minutes Intravenous  Once 02/28/2023 2123 02/23/2023 2215       Interim History / Subjective:  Pt requiring vasopressin 0.04 units/min and levophed gtt @36  mcg/min to maintain map 65 or higher.  She is no longer requiring epinephrine gtt.  She is currently not on continuous sedation not following commands or withdrawing from painful stimulation.  Absent gag/cough reflex   Objective   Blood pressure (!) 48/40, pulse (!) 120, temperature (!) 97 F (36.1 C), resp. rate 18, height 5\' 7"  (1.702 m),  weight 64.5 kg, SpO2 92%.    Vent Mode: PRVC FiO2 (%):  [45 %-60 %] 60 % Set Rate:  [16 bmp-18 bmp] 18 bmp Vt Set:  [450 mL] 450 mL PEEP:  [5 cmH20] 5 cmH20 Plateau Pressure:  [15 cmH20] 15 cmH20   Intake/Output Summary (Last 24 hours) at 02/05/2023 0827 Last data filed at 02/24/2023 0800 Gross per 24 hour  Intake 6434.38 ml  Output  382 ml  Net 6052.38 ml   Filed Weights   02/07/23 0141 02/07/23 1035  Weight: 60.6 kg 64.5 kg   Examination: General: Acutely-ill appearing female, NAD mechanically intubated  HENT: Supple, no JVD  Lungs: Diminished throughout, even, non labored  Cardiovascular: Sinus tachycardia, s1s2, no m/r/g; unable to doppler or palpate radial/distal pulses due to vasoconstriction secondary to high vasopressor requirements Abdomen: Faint BS x4, obese, taut, mildly distended  Extremities: Normal bulk  Skin: Midline surgical incision with honeycomb dressing intact with old dried drainage; RLQ ileostomy with moderate amount of sanguinous output  Neuro: Eyes slightly open, however unable to follow commands or withdraw from painful stimulation, no gag/cough reflex present.  Bilateral pupils 2 mm round/sluggish  GU: Indwelling foley catheter with minimal urinary output   Resolved Hospital Problem list     Assessment & Plan:   #Acute metabolic encephalopathy  #Mechanical intubation pain/discomfort  #Post-op pain  CT Head 11/4: Stable and normal for age noncontrast CT appearance of the brain. Chronic and postinflammatory appearing left mastoid effusion. - Correct metabolic derangements  - Maintain sleep/wake cycle  - Promote family presence at bedside  - Maintain RASS goal 0 to -1 - PAD protocol to maintain RASS goal: prn fentanyl also for pain management   #Severe septic shock  #HFpEF  - Continuous telemetry monitoring  - Aggressive iv fluid resuscitation and/or vasopressors to maintain map 65 or higher  - Hold outpatient antihypertensives   #Acute hypercapnic respiratory failure  #Severe metabolic acidosis  #COPD  #Mechanical intubation  - Full vent support for now: vent settings reviewed and established  - Continue lung protective strategies  - Maintain plateau pressures less than 30 cm H2O - VAP bundle implemented - Intermittent CXR's and ABG's - Scheduled and prn bronchodilator  therapy   #Acute kidney injury secondary to ATN #Hyperkalemia  #Hypocalcemia  #Hyponatremia  #Severe metabolic acidosis  #Lactic acidosis  - Trend BMP, lactic acid, and vbg  - Replace electrolytes as indicated - Strict I's and O's - Avoid nephrotoxic medications and renally dose medications  - Continue continuous bicarb infusion for now  - Nephrology consulted appreciate input: pt may require CRRT   #Sepsis  #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 secondary to 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 s/p exp lap and ileostomy~03/03/2023 - Trend WBC and monitor fever curve  - Trend PCT  - Follow cultures  - Continue abx and antifungal as outlined above  - General surgery consulted appreciate input  - OGT to LIS   #Postop acute blood loss anemia  - Trend CBC  - Monitor for s/sx of bleeding - Transfuse for hgb <7  #Prediabetes  #Hyperglycemia  - CBG's q4hrs  - SSI  - Follow hyper/hypoglycemic protocol   Best Practice (right click and "Reselect all SmartList Selections" daily)   Diet/type: NPO DVT prophylaxis: prophylactic heparin  GI prophylaxis: PPI Lines: Central line and arterial line  Foley:  Yes, and it is still needed Code Status:  full code Last date of multidisciplinary goals of care discussion [02/22/2023]  11/04: Will update  pts sister when she arrives at bedside  Labs   CBC: Recent Labs  Lab 02/23/2023 1746 02/07/23 0339 02/27/2023 0417  WBC 9.9 8.5 12.2*  HGB 13.5 12.1 8.9*  HCT 40.6 36.7 27.8*  MCV 91.6 93.9 95.9  PLT 377 343 266    Basic Metabolic Panel: Recent Labs  Lab 02/19/2023 1746 02/07/23 0339 02/25/2023 0417  NA 134* 135 131*  K 3.3* 3.8 5.3*  CL 89* 97* 99  CO2 28 22 17*  GLUCOSE 131* 113* 212*  BUN 87* 88* 95*  CREATININE 2.18* 2.50* 4.06*  CALCIUM 8.2* 6.9* 5.5*  MG 2.9* 2.3  --    GFR: Estimated Creatinine Clearance: 13.6 mL/min (A) (by C-G formula based on SCr of 4.06 mg/dL (H)). Recent Labs  Lab  02/17/2023 1746 03/02/2023 2123 02/07/23 0339 02/07/23 0623 02/07/23 1108 02/07/23 2336 02/13/2023 0417  WBC 9.9  --  8.5  --   --   --  12.2*  LATICACIDVEN  --    < >  --  1.7 1.5 2.4* 2.7*   < > = values in this interval not displayed.    Liver Function Tests: Recent Labs  Lab 02/11/2023 1746 02/07/23 0339 02/22/2023 0417  AST 33  --  72*  ALT 14  --  24  ALKPHOS 65  --  44  BILITOT 3.1*  --  2.0*  PROT 7.8  --  5.2*  ALBUMIN 3.9 2.9* 2.8*   Recent Labs  Lab 03/05/2023 1746  LIPASE 28   No results for input(s): "AMMONIA" in the last 168 hours.  ABG    Component Value Date/Time   PHART 7.12 (LL) 03/01/2023 0450   PCO2ART 52 (H) 03/05/2023 0450   PO2ART 62 (L) 03/04/2023 0450   HCO3 16.9 (L) 03/02/2023 0450   ACIDBASEDEF 12.5 (H) 03/04/2023 0450   O2SAT 90.4 02/16/2023 0450     Coagulation Profile: Recent Labs  Lab 02/07/23 0339  INR 1.4*    Cardiac Enzymes: No results for input(s): "CKTOTAL", "CKMB", "CKMBINDEX", "TROPONINI" in the last 168 hours.  HbA1C: Hgb A1c MFr Bld  Date/Time Value Ref Range Status  06/01/2022 09:06 AM 5.8 (H) 4.8 - 5.6 % Final    Comment:    (NOTE)         Prediabetes: 5.7 - 6.4         Diabetes: >6.4         Glycemic control for adults with diabetes: <7.0     CBG: Recent Labs  Lab 02/07/23 1612 02/07/23 1941 02/07/23 2350 02/22/2023 0449 02/28/2023 0743  GLUCAP 95 125* 96 205* 115*    Review of Systems:   Unable to assess pt mechanically intubated   Past Medical History:  She,  has a past medical history of CAD (coronary artery disease), Chronic diastolic CHF (congestive heart failure) (HCC), COPD (chronic obstructive pulmonary disease) (HCC), and Hypertension.   Surgical History:   Past Surgical History:  Procedure Laterality Date   ABDOMINAL HYSTERECTOMY     LEFT HEART CATH AND CORONARY ANGIOGRAPHY N/A 06/04/2022   Procedure: LEFT HEART CATH AND CORONARY ANGIOGRAPHY;  Surgeon: Marykay Lex, MD;  Location: [facility]  INVASIVE CV LAB;  Service: Cardiovascular;  Laterality: N/A;     Social History:   reports that she quit smoking about 9 months ago. Her smoking use included cigarettes. She started smoking about 30 years ago. She has a 7.5 pack-year smoking history. She has never used smokeless tobacco. She reports current alcohol use. She reports  that she does not use drugs.   Family History:  Her family history includes Heart attack in her mother; Stroke in her mother.   Allergies No Known Allergies   Home Medications  Prior to Admission medications   Medication Sig Start Date End Date Taking? Authorizing Provider  aspirin EC 81 MG tablet Take 1 tablet (81 mg total) by mouth daily. Swallow whole. 07/20/22  Yes Furth, Cadence H, PA-C  atorvastatin (LIPITOR) 40 MG tablet Take 1 tablet (40 mg total) by mouth daily. 11/30/22  Yes Furth, Cadence H, PA-C  losartan (COZAAR) 50 MG tablet Take 1 tablet (50 mg total) by mouth daily. 12/23/22  Yes Furth, Cadence H, PA-C  meclizine (ANTIVERT) 25 MG tablet Take 1 tablet (25 mg total) by mouth 3 (three) times daily as needed for dizziness. 08/23/21  Yes Cuthriell, Delorise Royals, PA-C  PROVENTIL HFA 108 (90 Base) MCG/ACT inhaler Inhale 2 puffs into the lungs every 4 (four) hours as needed for shortness of breath or wheezing. 12/22/21  Yes [provider]  umeclidinium-vilanterol (ANORO ELLIPTA) 62.5-25 MCG/ACT AEPB Inhale 1 puff into the lungs daily. 08/04/22  Yes Dgayli, Lianne Bushy, MD  metoprolol succinate (TOPROL-XL) 25 MG 24 hr tablet Take 1 tablet (25 mg total) by mouth daily. Patient not taking: Reported on 02/07/2023 06/09/22   Arnetha Courser, MD  Multiple Vitamin (MULTIVITAMIN WITH MINERALS) TABS tablet Take 1 tablet by mouth daily. Patient not taking: Reported on 02/07/2023 06/09/22   Arnetha Courser, MD     Critical care time: 25 minutes      Zada Girt, AGNP  Pulmonary/Critical Care Pager [phone] (please enter 7 digits) PCCM Consult Pager [phone]  (please enter 7 digits)

## 2023-02-08 NOTE — IPAL (Signed)
  Interdisciplinary Goals of Care Family Meeting   Date carried out: 02/13/2023  Location of the meeting: Conference room  Member's involved: Nurse Practitioner, Family Member or next of kin, and Other: 2 PA Students   Counsellor: Pts sister Katie Moody     Discussion: We discussed goals of care for Katie Moody . Ms Katie Moody stated she would want to proceed with dialysis if deemed necessary. Regarding code status she stated she would need to discuss this with her siblings before making a decision.    Code status:   Code Status: Full Code   Disposition: Continue current acute care  Time spent for the meeting: 20 minutes    Zada Girt, AGNP  Pulmonary/Critical Care Pager [phone] (please enter 7 digits) PCCM Consult Pager [phone] (please enter 7 digits)

## 2023-02-08 NOTE — Progress Notes (Signed)
Initial Nutrition Assessment  DOCUMENTATION CODES:   Not applicable  INTERVENTION:   Once appropriate for tube feeds, recommend:  Vital 1.2@50ml /hr- Initiate at 5ml/hr and increase by 1ml/hr q 8 hours until goal rate is reached.   ProSource TF 20- Give 60ml daily via tube, each supplement provides 80kcal and 20g of protein.   Free water flushes 30ml q4 hours to maintain tube patency   Regimen provides 1520kcal/day, 110g/day protein and 1110ml/day of free water.   Pt at high refeed risk; recommend monitor potassium, magnesium and phosphorus labs daily until stable  Daily weights   Thiamine 100mg  IV daily   NUTRITION DIAGNOSIS:   Inadequate oral intake related to inability to eat (pt sedated and ventilated) as evidenced by NPO status.  GOAL:   Provide needs based on ASPEN/SCCM guidelines  MONITOR:   Vent status, Labs, Weight trends, I & O's, Skin  REASON FOR ASSESSMENT:   Ventilator    ASSESSMENT:   62 y/o female with h/o COPD, CAD, CHF, HTN, HLD and NSTEMI who is admitted with pneumatosis coli, AKI and abdominal sepsis now s/p subtotal colectomy with end ileostomy 11/4.  Pt remains ventilated. RD unable to see pt today as pt having CT scan at time of RD visit. Per chart review, pt with abdominal distension and nausea for ~ one week pta. Pt with NGT in place to LCS with output. Pt with worsening AKI and very little UOP. Pt requiring multiple pressors today. Lactic acid trending up; will add thiamine. Pt is not stable for nutrition support today. Will plan to initiate tube feeds once pt is stable and ok per surgery. No ostomy output yet. Per chart, pt with weight gain pta. Pt is up ~9lbs since admission; will monitor daily weights. Pt is at high risk for developing malnutrition. RD will obtain NFPE at follow up.   Pt s/p resection of part of her distal ileum; will check B12 level.   Medications reviewed and include: heparin, protonix, levophed, zosyn, Na  bicarbonate  Labs reviewed: K 4.8 wnl, BUN 103(H), creat 4.19(H), Ca 5.2(L), P 8.4(H), Mg 2.2 wnl Hgb 7.1(L), Hct 22.1(L) Cbgs- 103, 115, 205 x 24 hrs AIC 5.8(H)- 2/26  Patient is currently intubated on ventilator support MV: 8 L/min Temp (24hrs), Avg:97.6 F (36.4 C), Min:96.8 F (36 C), Max:98.7 F (37.1 C)  Propofol: none   MAP- <   UOP- 32ml   NUTRITION - FOCUSED PHYSICAL EXAM: Unable to perform at this time   Diet Order:   Diet Order             Diet NPO time specified Except for: Ice Chips, Sips with Meds  Diet effective now                  EDUCATION NEEDS:   Not appropriate for education at this time  Skin:  Skin Assessment: Reviewed RN Assessment (ecchymosis)  Last BM:  pta  Height:   Ht Readings from Last 1 Encounters:  02/07/23 5\' 7"  (1.702 m)    Weight:   Wt Readings from Last 1 Encounters:  02/07/23 64.5 kg    Ideal Body Weight:  61.36 kg  BMI:  Body mass index is 22.27 kg/m.  Estimated Nutritional Needs:   Kcal:  1414kcal/day  Protein:  95-110g/day  Fluid:  1.7-1.9L/day  Betsey Holiday MS, RD, LDN Please refer to [facility] for RD and/or RD on-call/weekend/after hours pager

## 2023-02-08 NOTE — Interval H&P Note (Signed)
History and Physical Interval Note:  02/13/2023 1:27 AM  Katie Moody  has presented today for surgery, with the diagnosis of abdominal sepsis.  The various methods of treatment have been discussed with the patient and family.  Unfortunately more conservative measures of IV antibiotics and vasopressor support have been insufficient at improving her status, and led to maximal dosing, and her respiratory status has required assistance with intubation due to her progressive metabolic acidosis.  We feel the abdominal sepsis has worsened, and her only hope to reverse this septic shock is to alleviate/remove the source.  After consideration of risks, benefits and other options for treatment, the patient has consented to  Procedure(s): EXPLORATORY LAPAROTOMY (N/A) as a surgical intervention.  The patient's history has been reviewed, patient examined, no change in status, stable for surgery.  I have reviewed the patient's chart and labs.  Questions were answered to the patient's satisfaction.      Campbell Lerner

## 2023-02-08 NOTE — Progress Notes (Addendum)
Overnight patient was noted with worsening shortness of breath and refractory hypotension despite being on multiple vasopressor support. Lactate trending up from previous. Due to worsening symptoms concerning for progressing sepsis not amenable with conservative measures, general surgery was contacted for source control. Discussed with patient and sister General Surgery's plan fo urgent Ex Lap as surgical intervention. Patient consented to procedure as previously discussed with her as an option if conservative measures fails. Patient was intubated for airway protection/pre-procedure and taken to OR.   Webb Silversmith, DNP, CCRN, FNP-C, AGACNP-BC Acute Care & Family Nurse Practitioner  [facility]  See Amion for personal pager PCCM on call pager [phone] until 7 am

## 2023-02-08 NOTE — Progress Notes (Signed)
Annabelle Harman NP notified of critical lab value: Calcium 4.5. no urine output during this shift. Per Annabelle Harman will order calcium gluconate.

## 2023-02-08 NOTE — Consult Note (Signed)
[facility] Consult Note:02/11/2023    Date of Admission:  02/25/2023           Reason for Consult: Acute kidney injury    Referring Provider: Erin Fulling, MD Primary Care Provider: [facility]   History of Presenting Illness:  Katie Moody is a 62 y.o. female who is critically ill.  All information is obtained from the chart and primary team as patient is critically ill, intubated and sedated.  Patient was admitted on 02/15/2023 for evaluation of abdominal pain that had been going on for a week.  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  She underwent subtotal colectomy with end ileostomy last night/early morning of 02/19/2023.  Nephrology consult has been requested for evaluation of acute kidney injury. Patient's baseline creatinine is 0.68 from October 10, 2018 senting creatinine of 2.18 today, creatinine has worsened to 4.2. Patient is oliguric.  She is currently requiring 2 pressors and ventilator support.  She is receiving IV sodium bicarbonate and fusion.    Review of Systems: ROS -not available due to patient being critically ill.  Past Medical History:  Diagnosis Date   CAD (coronary artery disease)    Chronic diastolic CHF (congestive heart failure) (HCC)    COPD (chronic obstructive pulmonary disease) (HCC)    Hypertension     Social History   Tobacco Use   Smoking status: Former    Current packs/day: 0.00    Average packs/day: 0.3 packs/day for 30.0 years (7.5 ttl pk-yrs)    Types: Cigarettes    Start date: 05/1992    Quit date: 05/2022    Years since quitting: 0.7   Smokeless tobacco: Never  Vaping Use   Vaping status: Never Used  Substance Use Topics   Alcohol use: Yes    Comment: occasionally   Drug use: Never    Family History  Problem Relation Age of Onset   Heart attack Mother    Stroke Mother       OBJECTIVE: Blood pressure (!) 66/48, pulse 93, temperature (!) 97 F (36.1 C), resp. rate 17, height 5\' 7"  (1.702 m), weight 64.5 kg, SpO2 (!) 79%.  Physical Exam General Appearance-critically ill-appearing HEENT-ET tube, OG tube in place. Pulmonary: Ventilator assisted, FiO2 60%, PEEP 5. Abdomen: Midline incision, postsurgery Extr: No significant peripheral edema Neuro: Sedated  Lab Results Lab Results  Component Value Date   WBC 8.6 03/03/2023   HGB 7.1 (L) 02/25/2023   HCT 22.1 (L) 03/04/2023   MCV 96.5 02/10/2023   PLT 224 02/20/2023    Lab Results  Component Value Date   CREATININE 4.19 (H) 02/11/2023   BUN 103 (H) 02/24/2023   NA 135 02/17/2023   K 4.8 02/05/2023   CL 99 02/12/2023   CO2 20 (L) 03/05/2023    Lab Results  Component Value Date   ALT 24 02/15/2023   AST 72 (H) 02/15/2023   ALKPHOS 44 02/17/2023   BILITOT 2.0 (H) 02/28/2023     Microbiology: Recent Results (from the past 240 hour(s))  Blood culture (routine x 2)     Status: None (Preliminary result)   Collection Time: 03/04/2023  9:24 PM   Specimen: Right Antecubital; Blood  Result Value Ref Range Status   Specimen Description RIGHT ANTECUBITAL  Final   Special Requests   Final    BOTTLES DRAWN AEROBIC AND ANAEROBIC Blood Culture adequate volume   Culture  Final    NO GROWTH 2 DAYS Performed at [facility], [address] [place], [place] [address]    Report Status PENDING  Incomplete  Blood culture (routine x 2)     Status: None (Preliminary result)   Collection Time: 02/05/2023  9:36 PM   Specimen: Left Antecubital; Blood  Result Value Ref Range Status   Specimen Description LEFT ANTECUBITAL  Final   Special Requests   Final    BOTTLES DRAWN AEROBIC AND ANAEROBIC Blood Culture adequate volume   Culture   Final    NO GROWTH 2 DAYS Performed at [facility], [address], [place], [place] [address]    Report Status PENDING  Incomplete  MRSA Next Gen by  PCR, Nasal     Status: None   Collection Time: 02/07/23 10:40 AM   Specimen: Nasal Mucosa; Nasal Swab  Result Value Ref Range Status   MRSA by PCR Next Gen NOT DETECTED NOT DETECTED Final    Comment: (NOTE) The GeneXpert MRSA Assay (FDA approved for NASAL specimens only), is one component of a comprehensive MRSA colonization surveillance program. It is not intended to diagnose MRSA infection nor to guide or monitor treatment for MRSA infections. Test performance is not FDA approved in patients less than 32 years old. Performed at [facility], [address] [place], [place] [address]     Medications: Scheduled Meds:  sodium chloride   Intravenous Once   Chlorhexidine Gluconate Cloth  6 each Topical QHS   ipratropium-albuterol  3 mL Nebulization Q6H   pantoprazole (PROTONIX) IV  40 mg Intravenous QHS   thiamine (VITAMIN B1) injection  100 mg Intravenous Daily   Continuous Infusions:  epinephrine Stopped (02/23/2023 0451)   norepinephrine (LEVOPHED) Adult infusion 40 mcg/min (03/03/2023 1600)   piperacillin-tazobactam (ZOSYN)  IV Stopped (03/02/2023 1417)   sodium bicarbonate 150 mEq in dextrose 5 % 1,150 mL infusion 150 mL/hr at 02/14/2023 1600   vasopressin 0.04 Units/min (02/27/2023 1600)   PRN Meds:.albuterol, bisacodyl, fentaNYL (SUBLIMAZE) injection, fentaNYL (SUBLIMAZE) injection, ondansetron (ZOFRAN) IV, mouth rinse  No Known Allergies  Urinalysis: Recent Labs    02/05/2023 1746  COLORURINE AMBER*  LABSPEC 1.017  PHURINE 5.0  GLUCOSEU NEGATIVE  HGBUR NEGATIVE  BILIRUBINUR NEGATIVE  KETONESUR NEGATIVE  PROTEINUR NEGATIVE  NITRITE NEGATIVE  LEUKOCYTESUR NEGATIVE      Imaging: CT HEAD WO CONTRAST ( )  Result Date: 03/06/2023 CLINICAL DATA:  62 year old female altered mental status. Respiratory failure. EXAM: CT HEAD WITHOUT CONTRAST TECHNIQUE: Contiguous axial images were obtained from the base of the skull through the vertex without intravenous contrast.  RADIATION DOSE REDUCTION: This exam was performed according to the departmental dose-optimization program which includes automated exposure control, adjustment of the mA and/or kV according to patient size and/or use of iterative reconstruction technique. COMPARISON:  Brain MRI 06/01/2022.  Head CT 10/10/2022. FINDINGS: Brain: Cerebral volume is stable and within normal limits for age. No midline shift, ventriculomegaly, mass effect, evidence of mass lesion, intracranial hemorrhage or evidence of cortically based acute infarction. Gray-white differentiation stable and within normal limits for age. Vascular: No suspicious intracranial vascular hyperdensity. Calcified atherosclerosis at the skull base. Skull: No acute osseous abnormality identified. Sinuses/Orbits: Left mastoid effusion not significantly changed since February. Left tympanic cavity, other visualized paranasal sinuses and mastoids are stable and well aerated. Other: Visualized orbits and scalp soft tissues are within normal limits. IMPRESSION: 1. Stable and normal for age noncontrast CT appearance of the brain. 2. Chronic and  postinflammatory appearing left mastoid effusion. Electronically Signed   By: Odessa Fleming M.D.   On: 03/04/2023 12:15   DG Chest 1 View  Result Date: 02/18/2023 CLINICAL DATA:  Respiratory failure EXAM: CHEST  1 VIEW COMPARISON:  02/07/2023 FINDINGS: Interval placement of an endotracheal tube with its tip 2.4 cm above the carina. Left internal jugular central venous catheter with its tip within the superior cavoatrial junction and nasogastric tube extending into the upper abdomen beyond the margin of the examination are unchanged. Minimal left basilar atelectasis. Lungs are otherwise clear. No pneumothorax or pleural effusion. Cardiac size within normal limits. IMPRESSION: 1. Endotracheal tube in appropriate position. 2. Minimal left basilar atelectasis. Electronically Signed   By: Helyn Numbers M.D.   On: 02/16/2023 01:26   DG  Chest Port 1 View  Result Date: 02/07/2023 CLINICAL DATA:  PICC line placement. EXAM: PORTABLE CHEST 1 VIEW COMPARISON:  Radiographs 05/31/2022 and 08/23/2021. Chest CTA 05/31/2022. FINDINGS: 1403 hours. Left IJ central venous catheter tip projects over mid right atrium. Enteric tube projects below the diaphragm, tip not visualized. The heart size and mediastinal contours are stable with aortic atherosclerosis. There are lower lung volumes with mild bibasilar atelectasis. No confluent airspace disease, edema, pleural effusion or pneumothorax. The bones appear unremarkable. Telemetry leads overlie the chest. IMPRESSION: 1. Left IJ central venous catheter tip projects over the mid right atrium. No pneumothorax. 2. Lower lung volumes with mild bibasilar atelectasis. Electronically Signed   By: Carey Bullocks M.D.   On: 02/07/2023 14:44   DG Abdomen 1 View  Result Date: 02/22/2023 CLINICAL DATA:  NG tube placement EXAM: ABDOMEN - 1 VIEW COMPARISON:  None Available. FINDINGS: NG tube is in the mid stomach. Dilated bowel in the upper abdomen. Lung bases clear. IMPRESSION: NG tube in the mid stomach. Electronically Signed   By: Charlett Nose M.D.   On: 02/05/2023 23:21   CT ABDOMEN PELVIS WO CONTRAST  Result Date: 02/23/2023 CLINICAL DATA:  Sepsis. EXAM: CT ABDOMEN AND PELVIS WITHOUT CONTRAST TECHNIQUE: Multidetector CT imaging of the abdomen and pelvis was performed following the standard protocol without IV contrast. RADIATION DOSE REDUCTION: This exam was performed according to the departmental dose-optimization program which includes automated exposure control, adjustment of the mA and/or kV according to patient size and/or use of iterative reconstruction technique. COMPARISON:  None Available. FINDINGS: Lower chest: The visualized lung bases are clear. There is coronary vascular calcification. No intra-abdominal free air or free fluid. Hepatobiliary: The liver is unremarkable. No biliary dilatation. The  gallbladder is contracted. No calcified gallstone. Pancreas: Unremarkable. No pancreatic ductal dilatation or surrounding inflammatory changes. Spleen: Normal in size without focal abnormality. Adrenals/Urinary Tract: The left adrenal glands unremarkable. There is a 12 mm indeterminate right adrenal nodule. There is no hydronephrosis or nephrolithiasis on either side. The visualized ureters appear unremarkable. The urinary bladder is collapsed. Stomach/Bowel: There is mild thickened appearance of the wall of the cecum and ascending colon with intramural gas concerning for pneumatosis and suggestive of a necrotizing inflammatory process or typhlitis. Clinical correlation is recommended. Several mildly dilated loops of small bowel measure up to 3.5 cm in caliber, likely reactive ileus. No discrete transition noted to suggest obstruction. There is sigmoid diverticulosis and several scattered colonic diverticula. The appendix is normal. Vascular/Lymphatic: Advanced aortoiliac atherosclerotic disease. The IVC is unremarkable. No portal venous gas. There is no adenopathy. Reproductive: Hysterectomy.  No suspicious adnexal masses. Other: None Musculoskeletal: Disc desiccation and vacuum phenomena at L5-S1. No acute osseous  pathology. IMPRESSION: 1. 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 Clinical correlation is recommended. No portal venous gas or pneumoperitoneum. 2. Mildly dilated small bowel loops, likely reactive ileus. 3. Sigmoid diverticulosis. Normal appendix. 4.  Aortic Atherosclerosis (ICD10-I70.0). These results were called by telephone at the time of interpretation on 02/28/2023 at 10:09 pm to provider PHILLIP STAFFORD , who verbally acknowledged these results. Electronically Signed   By: Elgie Collard M.D.   On: 03/04/2023 22:12      Assessment/Plan:  Fiza Nation is a 62 y.o. female with medical problems of coronary artery disease, chronic diastolic CHF, COPD,  hypertension, significant history of smoking, current alcohol use  was admitted on 02/26/2023 for :  Pneumatosis of intestines [K63.89] Pneumatosis coli [K63.89]  Acute kidney injury Acute respiratory failure Acute lactic acidosis   Patient is critically ill and has multiorgan failure.  She is hypotensive requiring multiple pressors, ventilator support.  She is status post colectomy and end ileostomy on 03/03/2023.  She is currently oliguric with lactic acidosis. Awaiting further direction from ICU team/family whether they want to pursue aggressive care versus palliative care. If aggressive care is desired, patient would need dialysis catheter placed and initiate CRRT.   Katie Moody 02/25/2023

## 2023-02-08 NOTE — Anesthesia Preprocedure Evaluation (Signed)
Anesthesia Evaluation  Patient identified by MRN, date of birth, ID band Patient awake    Reviewed: Allergy & Precautions, H&P , NPO status , Patient's Chart, lab work & pertinent test results, reviewed documented beta blocker date and time   History of Anesthesia Complications Negative for: history of anesthetic complications  Airway Mallampati: Intubated  TM Distance: >3 FB Neck ROM: full    Dental no notable dental hx. (+) Chipped   Pulmonary Continuous Positive Airway Pressure Ventilation , COPD, former smoker   Pulmonary exam normal breath sounds clear to auscultation       Cardiovascular Exercise Tolerance: Good hypertension, + CAD, + Past MI and +CHF  Normal cardiovascular exam Rhythm:regular Rate:Normal     Neuro/Psych negative neurological ROS  negative psych ROS   GI/Hepatic negative GI ROS, Neg liver ROS,,,  Endo/Other  negative endocrine ROS    Renal/GU ARFRenal disease  negative genitourinary   Musculoskeletal   Abdominal   Peds  Hematology negative hematology ROS (+)   Anesthesia Other Findings Past Medical History: No date: CAD (coronary artery disease) No date: Chronic diastolic CHF (congestive heart failure) (HCC) No date: COPD (chronic obstructive pulmonary disease) (HCC) No date: Hypertension   Reproductive/Obstetrics negative OB ROS                             Anesthesia Physical Anesthesia Plan  ASA: 4 and emergent  Anesthesia Plan: General   Post-op Pain Management:    Induction:   PONV Risk Score and Plan: 3 and Ondansetron, Dexamethasone and Treatment may vary due to age or medical condition  Airway Management Planned: Oral ETT  Additional Equipment:   Intra-op Plan:   Post-operative Plan: Post-operative intubation/ventilation  Informed Consent: I have reviewed the patients History and Physical, chart, labs and discussed the procedure including  the risks, benefits and alternatives for the proposed anesthesia with the patient or authorized representative who has indicated his/her understanding and acceptance.     Dental Advisory Given  Plan Discussed with: Anesthesiologist, CRNA and Surgeon  Anesthesia Plan Comments: (Patient is coming to [place] from the ICU already intubated and on Vasopressin and Levophed.  She is having an ex-lap with Dr. Claudine Mouton for abdominal sepsis.  He plans on doing a "significant resection" and taking her back to the ICU with an ostomy.)       Anesthesia Quick Evaluation

## 2023-02-08 NOTE — Plan of Care (Signed)
  Problem: Education: Goal: Knowledge of General Education information will improve Description: Including pain rating scale, medication(s)/side effects and non-pharmacologic comfort measures Outcome: Progressing   Problem: Health Behavior/Discharge Planning: Goal: Ability to manage health-related needs will improve Outcome: Progressing   Problem: Clinical Measurements: Goal: Ability to maintain clinical measurements within normal limits will improve Outcome: Progressing   Problem: Activity: Goal: Risk for activity intolerance will decrease Outcome: Not Progressing   Problem: Nutrition: Goal: Adequate nutrition will be maintained Outcome: Not Progressing

## 2023-02-08 NOTE — Progress Notes (Signed)
Patient taken to OR at this time.

## 2023-02-08 NOTE — Transfer of Care (Signed)
Immediate Anesthesia Transfer of Care Note  Patient: Katie Moody  Procedure(s) Performed: EXPLORATORY LAPAROTOMY  Patient Location: ICU  Anesthesia Type:General  Level of Consciousness: Patient remains intubated per anesthesia plan  Airway & Oxygen Therapy: Patient remains intubated per anesthesia plan and Patient placed on Ventilator (see vital sign flow sheet for setting)  Post-op Assessment: Report given to RN  Post vital signs: unstable  Last Vitals:  Vitals Value Taken Time  BP    Temp    Pulse    Resp    SpO2      Last Pain:  Vitals:   02/10/2023 0000  TempSrc: Axillary  PainSc:       Patients Stated Pain Goal: 0 (02/07/23 2024)  Complications: No notable events documented.

## 2023-02-09 DIAGNOSIS — K6389 Other specified diseases of intestine: Secondary | ICD-10-CM | POA: Diagnosis not present

## 2023-02-09 LAB — CBC
HCT: 24.3 % — ABNORMAL LOW (ref 36.0–46.0)
Hemoglobin: 8.4 g/dL — ABNORMAL LOW (ref 12.0–15.0)
MCH: 30.7 pg (ref 26.0–34.0)
MCHC: 34.6 g/dL (ref 30.0–36.0)
MCV: 88.7 fL (ref 80.0–100.0)
Platelets: 157 10*3/uL (ref 150–400)
RBC: 2.74 MIL/uL — ABNORMAL LOW (ref 3.87–5.11)
RDW: 14 % (ref 11.5–15.5)
WBC: 17.2 10*3/uL — ABNORMAL HIGH (ref 4.0–10.5)
nRBC: 0 % (ref 0.0–0.2)

## 2023-02-09 LAB — COMPREHENSIVE METABOLIC PANEL
ALT: 24 U/L (ref 0–44)
ALT: 68 U/L — ABNORMAL HIGH (ref 0–44)
ALT: 54 U/L — ABNORMAL HIGH (ref 0–44)
AST: 72 U/L — ABNORMAL HIGH (ref 15–41)
AST: 301 U/L — ABNORMAL HIGH (ref 15–41)
AST: 251 U/L — ABNORMAL HIGH (ref 15–41)
Albumin: 2.8 g/dL — ABNORMAL LOW (ref 3.5–5.0)
Albumin: 1.6 g/dL — ABNORMAL LOW (ref 3.5–5.0)
Albumin: <1.5 g/dL — ABNORMAL LOW (ref 3.5–5.0)
Alkaline Phosphatase: 44 U/L (ref 38–126)
Alkaline Phosphatase: 91 U/L (ref 38–126)
Alkaline Phosphatase: 89 U/L (ref 38–126)
Anion gap: 15 (ref 5–15)
Anion gap: 19 — ABNORMAL HIGH (ref 5–15)
Anion gap: 17 — ABNORMAL HIGH (ref 5–15)
BUN: 95 mg/dL — ABNORMAL HIGH (ref 8–23)
BUN: 97 mg/dL — ABNORMAL HIGH (ref 8–23)
BUN: 89 mg/dL — ABNORMAL HIGH (ref 8–23)
CO2: 17 mmol/L — ABNORMAL LOW (ref 22–32)
CO2: 22 mmol/L (ref 22–32)
CO2: 20 mmol/L — ABNORMAL LOW (ref 22–32)
Calcium: 5.5 mg/dL — CL (ref 8.9–10.3)
Calcium: 5 mg/dL — CL (ref 8.9–10.3)
Calcium: 4.6 mg/dL — CL (ref 8.9–10.3)
Chloride: 99 mmol/L (ref 98–111)
Chloride: 89 mmol/L — ABNORMAL LOW (ref 98–111)
Chloride: 93 mmol/L — ABNORMAL LOW (ref 98–111)
Creatinine, Ser: 4.06 mg/dL — ABNORMAL HIGH (ref 0.44–1.00)
Creatinine, Ser: 4.47 mg/dL — ABNORMAL HIGH (ref 0.44–1.00)
Creatinine, Ser: 4.16 mg/dL — ABNORMAL HIGH (ref 0.44–1.00)
GFR, Estimated: 12 mL/min — ABNORMAL LOW (ref >60)
GFR, Estimated: 10 mL/min — ABNORMAL LOW (ref >60)
GFR, Estimated: 11 mL/min — ABNORMAL LOW (ref >60)
Glucose, Bld: 212 mg/dL — ABNORMAL HIGH (ref 70–99)
Glucose, Bld: 167 mg/dL — ABNORMAL HIGH (ref 70–99)
Glucose, Bld: 176 mg/dL — ABNORMAL HIGH (ref 70–99)
Potassium: 5.3 mmol/L — ABNORMAL HIGH (ref 3.5–5.1)
Potassium: 5.6 mmol/L — ABNORMAL HIGH (ref 3.5–5.1)
Potassium: 6.2 mmol/L — ABNORMAL HIGH (ref 3.5–5.1)
Sodium: 131 mmol/L — ABNORMAL LOW (ref 135–145)
Sodium: 130 mmol/L — ABNORMAL LOW (ref 135–145)
Sodium: 130 mmol/L — ABNORMAL LOW (ref 135–145)
Total Bilirubin: 2 mg/dL — ABNORMAL HIGH (ref <1.2)
Total Bilirubin: 1.4 mg/dL — ABNORMAL HIGH (ref <1.2)
Total Bilirubin: 1.1 mg/dL (ref <1.2)
Total Protein: 5.2 g/dL — ABNORMAL LOW (ref 6.5–8.1)
Total Protein: 3.6 g/dL — ABNORMAL LOW (ref 6.5–8.1)
Total Protein: <3 g/dL — ABNORMAL LOW (ref 6.5–8.1)

## 2023-02-09 LAB — GLUCOSE, CAPILLARY
Glucose-Capillary: 152 mg/dL — ABNORMAL HIGH (ref 70–99)
Glucose-Capillary: 155 mg/dL — ABNORMAL HIGH (ref 70–99)
Glucose-Capillary: 245 mg/dL — ABNORMAL HIGH (ref 70–99)
Glucose-Capillary: 94 mg/dL (ref 70–99)

## 2023-02-09 LAB — BLOOD GAS, ARTERIAL
Acid-base deficit: 4.2 mmol/L — ABNORMAL HIGH (ref 0.0–2.0)
Acid-base deficit: 10.1 mmol/L — ABNORMAL HIGH (ref 0.0–2.0)
Bicarbonate: 23.7 mmol/L (ref 20.0–28.0)
Bicarbonate: 18.3 mmol/L — ABNORMAL LOW (ref 20.0–28.0)
FIO2: 60 %
FIO2: 60 %
MECHVT: 450 mL
MECHVT: 450 mL
Mechanical Rate: 18
Mechanical Rate: 18
O2 Saturation: 99.5 %
O2 Saturation: 100 %
PEEP: 5 cmH2O
PEEP: 5 cmH2O
Patient temperature: 37
Patient temperature: 37
pCO2 arterial: 54 mm[Hg] — ABNORMAL HIGH (ref 32–48)
pCO2 arterial: 49 mm[Hg] — ABNORMAL HIGH (ref 32–48)
pH, Arterial: 7.25 — ABNORMAL LOW (ref 7.35–7.45)
pH, Arterial: 7.18 — CL (ref 7.35–7.45)
pO2, Arterial: 91 mm[Hg] (ref 83–108)
pO2, Arterial: 106 mm[Hg] (ref 83–108)

## 2023-02-09 LAB — HEMOGLOBIN AND HEMATOCRIT, BLOOD
HCT: 19.8 % — ABNORMAL LOW (ref 36.0–46.0)
Hemoglobin: 6.6 g/dL — ABNORMAL LOW (ref 12.0–15.0)

## 2023-02-09 LAB — PHOSPHORUS: Phosphorus: 10.5 mg/dL — ABNORMAL HIGH (ref 2.5–4.6)

## 2023-02-09 LAB — MAGNESIUM: Magnesium: 2.4 mg/dL (ref 1.7–2.4)

## 2023-02-09 LAB — LACTIC ACID, PLASMA
Lactic Acid, Venous: 4.2 mmol/L (ref 0.5–1.9)
Lactic Acid, Venous: 6 mmol/L (ref 0.5–1.9)

## 2023-02-09 LAB — HEMOGLOBIN A1C
Hgb A1c MFr Bld: 6.3 % — ABNORMAL HIGH (ref 4.8–5.6)
Mean Plasma Glucose: 134.11 mg/dL

## 2023-02-09 LAB — PREPARE RBC (CROSSMATCH)

## 2023-02-09 LAB — VITAMIN B12: Vitamin B-12: 1111 pg/mL — ABNORMAL HIGH (ref 180–914)

## 2023-02-09 MED ORDER — CALCIUM GLUCONATE-NACL 1-0.675 GM/50ML-% IV SOLN
1.0000 g | Freq: Once | INTRAVENOUS | Status: AC
  Administered 2023-02-09: 1000 mg via INTRAVENOUS
  Filled 2023-02-09: qty 50

## 2023-02-09 MED ORDER — SODIUM BICARBONATE 8.4 % IV SOLN
INTRAVENOUS | Status: AC
  Filled 2023-02-09: qty 50

## 2023-02-09 MED ORDER — DEXTROSE 50 % IV SOLN
1.0000 | Freq: Once | INTRAVENOUS | Status: AC
  Administered 2023-02-09: 50 mL via INTRAVENOUS
  Filled 2023-02-09: qty 50

## 2023-02-09 MED ORDER — SODIUM BICARBONATE 8.4 % IV SOLN
100.0000 meq | Freq: Once | INTRAVENOUS | Status: AC
  Administered 2023-02-09: 100 meq via INTRAVENOUS
  Filled 2023-02-09: qty 50

## 2023-02-09 MED ORDER — ACETAMINOPHEN 650 MG RE SUPP: 650.0000 mg | Freq: Four times a day (QID) | RECTAL | Status: DC | PRN

## 2023-02-09 MED ORDER — MORPHINE 100MG IN NS 100ML (1MG/ML) PREMIX INFUSION
INTRAVENOUS | Status: AC
  Administered 2023-02-09: 1 mg/h via INTRAVENOUS
  Filled 2023-02-09: qty 100

## 2023-02-09 MED ORDER — ORAL CARE MOUTH RINSE
15.0000 mL | OROMUCOSAL | Status: DC
  Administered 2023-02-09 (×2): 15 mL via OROMUCOSAL

## 2023-02-09 MED ORDER — GLYCOPYRROLATE 0.2 MG/ML IJ SOLN: 0.2000 mg | INTRAMUSCULAR | Status: DC | PRN

## 2023-02-09 MED ORDER — SODIUM CHLORIDE 0.9 % IV BOLUS
1000.0000 mL | Freq: Once | INTRAVENOUS | Status: AC
  Administered 2023-02-09: 1000 mL via INTRAVENOUS

## 2023-02-09 MED ORDER — INSULIN ASPART 100 UNIT/ML IJ SOLN
0.0000 [IU] | INTRAMUSCULAR | Status: DC
  Administered 2023-02-09: 2 [IU] via SUBCUTANEOUS
  Filled 2023-02-09: qty 1

## 2023-02-09 MED ORDER — DEXTROSE 50 % IV SOLN
1.0000 | Freq: Once | INTRAVENOUS | Status: AC
  Administered 2023-02-09: 50 mL via INTRAVENOUS

## 2023-02-09 MED ORDER — POLYVINYL ALCOHOL 1.4 % OP SOLN: 1.0000 [drp] | Freq: Four times a day (QID) | OPHTHALMIC | Status: DC | PRN

## 2023-02-09 MED ORDER — INSULIN ASPART 100 UNIT/ML IV SOLN
10.0000 [IU] | Freq: Once | INTRAVENOUS | Status: AC
  Administered 2023-02-09: 10 [IU] via INTRAVENOUS
  Filled 2023-02-09: qty 0.1

## 2023-02-09 MED ORDER — MORPHINE 100MG IN NS 100ML (1MG/ML) PREMIX INFUSION
1.0000 mg/h | INTRAVENOUS | Status: DC
Start: 2023-02-09 — End: 2023-02-09

## 2023-02-09 MED ORDER — LORAZEPAM 2 MG/ML IJ SOLN: 2.0000 mg | INTRAMUSCULAR | Status: DC | PRN

## 2023-02-09 MED ORDER — SODIUM CHLORIDE 0.9% IV SOLUTION: Freq: Once | INTRAVENOUS | Status: AC

## 2023-02-09 MED ORDER — ORAL CARE MOUTH RINSE: 15.0000 mL | OROMUCOSAL | Status: DC | PRN

## 2023-02-10 LAB — TYPE AND SCREEN
ABO/RH(D): B POS
Antibody Screen: NEGATIVE
Unit division: 0
Unit division: 0

## 2023-02-10 LAB — BPAM RBC
Blood Product Expiration Date: 202411212359
Blood Product Expiration Date: 202411212359
ISSUE DATE / TIME: 202411051136
ISSUE DATE / TIME: 202411041741
Unit Type and Rh: 7300
Unit Type and Rh: 7300

## 2023-02-10 LAB — CULTURE, RESPIRATORY W GRAM STAIN

## 2023-02-10 LAB — SURGICAL PATHOLOGY

## 2023-02-11 LAB — CULTURE, BLOOD (ROUTINE X 2)
Culture: NO GROWTH
Culture: NO GROWTH
Special Requests: ADEQUATE
Special Requests: ADEQUATE

## 2023-03-03 ENCOUNTER — Ambulatory Visit: Payer: Medicaid Other | Admitting: Medical

## 2023-03-07 NOTE — Death Summary Note (Addendum)
DEATH SUMMARY   Patient Details  Name: Katie Moody MRN: [id number] DOB: 12-21-1960  Admission/Discharge Information   Admit Date:  Mar 08, 2023  Date of Death:  03/11/2023  Time of Death:  1354-03-16  Length of Stay: 3  Referring Physician: [facility]   Reason(s) for Hospitalization  Abdominal Pain   Diagnoses  Preliminary cause of death: Septic shock (HCC) Secondary Diagnoses (including complications and co-morbidities):  Principal Problem:   Pneumatosis of intestines Active Problems:   Septic shock (HCC)   Chronic obstructive pulmonary disease (HCC)   CAD (coronary artery disease)   Chronic diastolic CHF (congestive heart failure) (HCC)   Hypertension   HLD (hyperlipidemia)   Hypokalemia   AKI (acute kidney injury) (HCC)   Adrenal nodule (HCC)   Pneumatosis coli   Stricture of sigmoid colon (HCC)   Rectal stenosis   Severe metabolic acidosis     Lactic acidosis    Hyperkalemia    Small bowel obstruction    Postop acute blood loss anemia    Hypocalcemia    Hyponatremia    Hyperkalemia    Hyperglycemia   Brief Hospital Course (including significant findings, care, treatment, and services provided and events leading to death)  Katie Moody is a 62 y.o. year old female who presented to the hospital with a 1 week hx abdominal pain on March 08, 2023. In the ER she was noted to have diffuse and constant abdominal pain with a distended abdomen. She reported nausea and several episodes of non-bilious and non-blood vomiting. She had no fevers or chills. On presentation, she was noted to be hypotensive, with improvement following fluid resuscitation.   Imaging in the ED included a CT scan of the abdomen and pelvis which was notable for pneumatosis of the cecum and ascending colon, concerning for typhlitis. There was also associated ileus. She was started on IV antibiotics and admitted to the progressive care unit per hospitalist team.   SIGNIFICANT  HOSPITAL EVENTS:  March 08, 2023: admit to [facility], CT with pneumatosis 02/07/2023: transfer to ICU, increased pressor requirements. CVC and aline placed 02/07/2023: Pt decompensated overnight with worsening septic shock, acute respiratory failure, and severe metabolic acidosis requiring mechanical intubation and multiple vasopressor support.  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  Postop pt remained mechanically intubated requiring levophed/vasopressin/epinephrine/sodium bicarb gtts.  Later during the shift pt started following commands  03-11-23: Pt with worsening metabolic and lactic acidosis.  She is no longer following commands.  Now requiring vasopressin @0 .03 units/min and levophed gtt @26  mcg/min.  Ileostomy stoma is now necrotic with worsening abdominal distension.  General Surgery evaluated pt and findings are concerning for progressive small bowel ischemia.  General surgery spoke with family and offered surgery to take a "second look," however family informed she carries perioperative mortality and morbidity.  Family changed code status to DNR and are trying to make a decision regarding surgery.  Following additional goals of care discussions pts family decided to transition pt to Comfort Measure Only once all family arrived at bedside.  However, pt expired on 03-11-2023 at 1355 prior to transitioned to Comfort Measures Only with family present at bedside.      Pertinent Labs and Studies  Significant Diagnostic Studies CT HEAD WO CONTRAST ( )  Result Date: 03/01/2023 CLINICAL DATA:  62 year old female altered mental status. Respiratory failure. EXAM: CT HEAD WITHOUT CONTRAST TECHNIQUE: Contiguous axial  images were obtained from the base of the skull through the vertex without intravenous contrast. RADIATION  DOSE REDUCTION: This exam was performed according to the departmental dose-optimization program which includes automated exposure control, adjustment of the mA and/or kV according to patient size and/or use of iterative reconstruction technique. COMPARISON:  Brain MRI 06/01/2022.  Head CT 10/10/2022. FINDINGS: Brain: Cerebral volume is stable and within normal limits for age. No midline shift, ventriculomegaly, mass effect, evidence of mass lesion, intracranial hemorrhage or evidence of cortically based acute infarction. Gray-white differentiation stable and within normal limits for age. Vascular: No suspicious intracranial vascular hyperdensity. Calcified atherosclerosis at the skull base. Skull: No acute osseous abnormality identified. Sinuses/Orbits: Left mastoid effusion not significantly changed since February. Left tympanic cavity, other visualized paranasal sinuses and mastoids are stable and well aerated. Other: Visualized orbits and scalp soft tissues are within normal limits. IMPRESSION: 1. Stable and normal for age noncontrast CT appearance of the brain. 2. Chronic and postinflammatory appearing left mastoid effusion. Electronically Signed   By: Odessa Fleming M.D.   On: 02/05/2023 12:15   DG Chest 1 View  Result Date: 02/16/2023 CLINICAL DATA:  Respiratory failure EXAM: CHEST  1 VIEW COMPARISON:  02/07/2023 FINDINGS: Interval placement of an endotracheal tube with its tip 2.4 cm above the carina. Left internal jugular central venous catheter with its tip within the superior cavoatrial junction and nasogastric tube extending into the upper abdomen beyond the margin of the examination are unchanged. Minimal left basilar atelectasis. Lungs are otherwise clear. No pneumothorax or pleural effusion. Cardiac size within normal limits. IMPRESSION: 1. Endotracheal tube in appropriate position. 2. Minimal left basilar atelectasis. Electronically Signed   By: Helyn Numbers M.D.   On: 02/22/2023 01:26   DG Chest  Port 1 View  Result Date: 02/07/2023 CLINICAL DATA:  PICC line placement. EXAM: PORTABLE CHEST 1 VIEW COMPARISON:  Radiographs 05/31/2022 and 08/23/2021. Chest CTA 05/31/2022. FINDINGS: 1403 hours. Left IJ central venous catheter tip projects over mid right atrium. Enteric tube projects below the diaphragm, tip not visualized. The heart size and mediastinal contours are stable with aortic atherosclerosis. There are lower lung volumes with mild bibasilar atelectasis. No confluent airspace disease, edema, pleural effusion or pneumothorax. The bones appear unremarkable. Telemetry leads overlie the chest. IMPRESSION: 1. Left IJ central venous catheter tip projects over the mid right atrium. No pneumothorax. 2. Lower lung volumes with mild bibasilar atelectasis. Electronically Signed   By: Carey Bullocks M.D.   On: 02/07/2023 14:44   DG Abdomen 1 View  Result Date: 02/24/2023 CLINICAL DATA:  NG tube placement EXAM: ABDOMEN - 1 VIEW COMPARISON:  None Available. FINDINGS: NG tube is in the mid stomach. Dilated bowel in the upper abdomen. Lung bases clear. IMPRESSION: NG tube in the mid stomach. Electronically Signed   By: Charlett Nose M.D.   On: 02/20/2023 23:21   CT ABDOMEN PELVIS WO CONTRAST  Result Date: 02/19/2023 CLINICAL DATA:  Sepsis. EXAM: CT ABDOMEN AND PELVIS WITHOUT CONTRAST TECHNIQUE: Multidetector CT imaging of the abdomen and pelvis was performed following the standard protocol without IV contrast. RADIATION DOSE REDUCTION: This exam was performed according to the departmental dose-optimization program which includes automated exposure control, adjustment of the mA and/or kV according to patient size and/or use of iterative reconstruction technique. COMPARISON:  None Available. FINDINGS: Lower chest: The visualized lung bases are clear. There is coronary vascular calcification. No intra-abdominal free air or free fluid. Hepatobiliary: The liver is unremarkable. No biliary dilatation. The  gallbladder is contracted. No calcified gallstone. Pancreas: Unremarkable. No pancreatic ductal dilatation or surrounding inflammatory changes. Spleen: Normal in size without focal abnormality. Adrenals/Urinary Tract: The left adrenal glands unremarkable. There is a 12 mm indeterminate right adrenal nodule. There is no hydronephrosis or nephrolithiasis on either side. The visualized ureters appear unremarkable. The urinary bladder is collapsed. Stomach/Bowel: There is mild thickened appearance of the wall of the cecum and ascending colon with intramural gas concerning for pneumatosis and suggestive of a necrotizing inflammatory process or typhlitis. Clinical correlation is recommended. Several mildly dilated loops of small bowel measure up to 3.5 cm in caliber, likely reactive ileus. No discrete transition noted to suggest obstruction. There is sigmoid diverticulosis and several scattered colonic diverticula. The appendix is normal. Vascular/Lymphatic: Advanced aortoiliac atherosclerotic disease. The IVC is unremarkable. No portal venous gas. There is no adenopathy. Reproductive: Hysterectomy.  No suspicious adnexal masses. Other: None Musculoskeletal: Disc desiccation and vacuum phenomena at L5-S1. No acute osseous pathology. IMPRESSION: 1. 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 Clinical correlation is recommended. No portal venous gas or pneumoperitoneum. 2. Mildly dilated small bowel loops, likely reactive ileus. 3. Sigmoid diverticulosis. Normal appendix. 4.  Aortic Atherosclerosis (ICD10-I70.0). These results were called by telephone at the time of interpretation on 02/11/2023 at 10:09 pm to provider PHILLIP STAFFORD , who verbally acknowledged these results. Electronically Signed   By: Elgie Collard M.D.   On: 03/03/2023 22:12    Microbiology Recent Results (from the past 240 hour(s))  Blood culture (routine x 2)     Status: None (Preliminary result)   Collection Time:  02/21/2023  9:24 PM   Specimen: Right Antecubital; Blood  Result Value Ref Range Status   Specimen Description RIGHT ANTECUBITAL  Final   Special Requests   Final    BOTTLES DRAWN AEROBIC AND ANAEROBIC Blood Culture adequate volume   Culture   Final    NO GROWTH 3 DAYS Performed at [facility], [address], [place], [place] [address]    Report Status PENDING  Incomplete  Blood culture (routine x 2)     Status: None (Preliminary result)   Collection Time: 02/24/2023  9:36 PM   Specimen: Left Antecubital; Blood  Result Value Ref Range Status   Specimen Description LEFT ANTECUBITAL  Final   Special Requests   Final    BOTTLES DRAWN AEROBIC AND ANAEROBIC Blood Culture adequate volume   Culture   Final    NO GROWTH 3 DAYS Performed at [facility], [address] [place], [place] [address]    Report Status PENDING  Incomplete  MRSA Next Gen by PCR, Nasal     Status: None   Collection Time: 02/07/23 10:40 AM   Specimen: Nasal Mucosa; Nasal Swab  Result Value Ref Range Status   MRSA by PCR Next Gen NOT DETECTED NOT DETECTED Final    Comment: (NOTE) The GeneXpert MRSA Assay (FDA approved for NASAL specimens only), is one component of a comprehensive MRSA colonization surveillance program. It is not intended to diagnose MRSA infection nor to guide or monitor treatment for MRSA infections. Test performance is not FDA approved in patients less than 73 years old. Performed at [facility], [address] [place], [place] [address]   Culture, Respiratory w Gram Stain     Status: None (Preliminary result)   Collection Time: 03/06/2023  1:14 PM   Specimen: Tracheal Aspirate; Respiratory  Result Value Ref Range Status   Specimen Description  Final    TRACHEAL ASPIRATE Performed at [facility], [address], [place], [place] [address]    Special Requests   Final    NONE Performed at [facility], [address] [place], [place]  [address]    Gram Stain   Final    FEW WBC PRESENT, PREDOMINANTLY PMN RARE GRAM NEGATIVE RODS RARE BUDDING YEAST SEEN    Culture   Final    MODERATE GRAM NEGATIVE RODS IDENTIFICATION AND SUSCEPTIBILITIES TO FOLLOW Performed at [facility], [address], [place], [place] [address]    Report Status PENDING  Incomplete    Lab Basic Metabolic Panel: Recent Labs  Lab 02/22/2023 1746 02/07/23 0339 02/05/2023 0417 03/05/2023 0940 03/06/2023 1733 02/27/2023 2126 02/22/2023 0446 03/04/2023 0943  NA 134* 135 131* 135  --  133* 130* 130*  K 3.3* 3.8 5.3* 4.8  --  4.4 5.6* 6.2*  CL 89* 97* 99 99  --  94* 89* 93*  CO2 28 22 17* 20*  --  25 22 20*  GLUCOSE 131* 113* 212* 204*  --  195* 167* 176*  BUN 87* 88* 95* 103*  --  98* 97* 89*  CREATININE 2.18* 2.50* 4.06* 4.19*  --  4.34* 4.47* 4.16*  CALCIUM 8.2* 6.9* 5.5* 5.2* 4.5* 5.0* 5.0* 4.6*  MG 2.9* 2.3  --  2.2  --   --  2.4  --   PHOS  --   --   --  8.4*  --   --  10.5*  --    Liver Function Tests: Recent Labs  Lab 02/14/2023 1746 02/07/23 0339 02/12/2023 0417 02/18/2023 2126 02/26/2023 0446 02/11/2023 0943  AST 33  --  72* 246* 301* 251*  ALT 14  --  24 61* 68* 54*  ALKPHOS 65  --  44 66 91 89  BILITOT 3.1*  --  2.0* 1.6* 1.4* 1.1  PROT 7.8  --  5.2* 3.8* 3.6* <3.0*  ALBUMIN 3.9 2.9* 2.8* 1.8* 1.6* <1.5*   Recent Labs  Lab 02/17/2023 1746  LIPASE 28   No results for input(s): "AMMONIA" in the last 168 hours. CBC: Recent Labs  Lab 03/03/2023 1746 02/07/23 0339 02/22/2023 0417 02/28/2023 1211 03/02/2023 1404 02/10/2023 2126 02/19/2023 0446 02/21/2023 0943  WBC 9.9 8.5 12.2* 8.6  --   --  17.2*  --   NEUTROABS  --   --   --  6.9  --   --   --   --   HGB 13.5 12.1 8.9* 8.1* 7.1* 9.0* 8.4* 6.6*  HCT 40.6 36.7 27.8* 25.1* 22.1* 27.4* 24.3* 19.8*  MCV 91.6 93.9 95.9 96.5  --   --  88.7  --   PLT 377 343 266 224  --   --  157  --    Cardiac Enzymes: No results for input(s): "CKTOTAL", "CKMB", "CKMBINDEX", "TROPONINI" in the last 168  hours. Sepsis Labs: Recent Labs  Lab 02/07/23 0339 02/07/23 0623 02/14/2023 0417 03/02/2023 0809 03/03/2023 1211 03/03/2023 2126 02/25/2023 0018 02/27/2023 0446  PROCALCITON  --   --  19.58  --   --   --   --   --   WBC 8.5  --  12.2*  --  8.6  --   --  17.2*  LATICACIDVEN  --    < > 2.7*   < > 4.6* 4.0* 4.2* 6.0*   < > = values in this interval not displayed.    Procedures/Operations  Mechanical  Intubation  Left Internal Jugular Central Line Placement  Left Femoral Arterial Line Placement  Exploratory Laparotomy and Subtotal Colectomy with End Ileostomy for Pneumatosis Intestinalis   Zada Girt, AGNP  Pulmonary/Critical Care Pager [phone] (please enter 7 digits) PCCM Consult Pager [phone] (please enter 7 digits)

## 2023-03-07 NOTE — IPAL (Signed)
  Interdisciplinary Goals of Care Family Meeting   Date carried out: 03/04/2023  Location of the meeting: Conference room  Member's involved: Physician, Nurse Practitioner, Bedside Registered Nurse, and Family Member or next of kin      GOALS OF CARE DISCUSSION  The Clinical status was relayed to family in detail- All Siblings of patient  Updated and notified of patients medical condition- Patient remains unresponsive and will not open eyes to command.   Patient is having a weak cough and struggling to remove secretions.   Patient with increased WOB and using accessory muscles to breathe Explained to family course of therapy and the modalities   Patient with Progressive multiorgan failure with a very high probablity of a very minimal chance of meaningful recovery despite all aggressive and optimal medical therapy.   Patient has dead bowel, ischemic bowel Progressive multiorgan failure   Family understands the situation.  They have consented and agreed to DNR status  Family are satisfied with Plan of action and management. All questions answered  Additional CC time 35 mins   Spruha Weight Santiago Glad, M.D.  [facility]  Medical Director [facility] Medical Director [facility] Cardio-Pulmonary Department

## 2023-03-07 NOTE — Progress Notes (Signed)
Verbal order from Zada Girt, NP to increase levo from to .

## 2023-03-07 NOTE — Progress Notes (Signed)
Pharmacy Antibiotic Note  Katie Moody is a 62 y.o. female admitted on 02/20/2023 with pneumatosis intestinalis. Pharmacy has been consulted for Zosyn dosing.   Today, 02/19/2023 Day 3 of Zosyn  WBC trending up; 17.2 today  Afebrile  PCT 19.58 Scr 4.16 today with estimated CrCl 13.3 mL/min   Plan: Continue Zosyn 2.25 gm Q8H based on renal function  F/u trach aspirate culture - GNRs and yeast on gram stain  Pharmacy will continue to follow and will adjust antibiotic dosing whenever warranted  Temp (24hrs), Avg:98.6 F (37 C), Min:96.4 F (35.8 C), Max:100.4 F (38 C)  Recent Labs  Lab 03/03/2023 1746 03/01/2023 2123 02/07/23 0339 02/07/23 0623 03/04/2023 0417 03/06/2023 0809 02/14/2023 0940 02/25/2023 1211 03/05/2023 2126 02/21/2023 0018 02/24/2023 0446 02/25/2023 0943  WBC 9.9  --  8.5  --  12.2*  --   --  8.6  --   --  17.2*  --   CREATININE 2.18*  --  2.50*  --  4.06*  --  4.19*  --  4.34*  --  4.47* 4.16*  LATICACIDVEN  --    < >  --    < > 2.7* 2.7*  --  4.6* 4.0* 4.2* 6.0*  --    < > = values in this interval not displayed.    Estimated Creatinine Clearance: 13.3 mL/min (A) (by C-G formula based on SCr of 4.16 mg/dL (H)).    Antimicrobials this admission: 11/02 Voriconazole >> 11/04 11/02 Zosyn >>   Microbiology results:  11/02 BCx: NGTD 11/03 MRSA nares: negative 11/04 Trach aspirate: GNRs and yeast on gram stain   Thank you for allowing pharmacy to be a part of this patient's care.  Littie Deeds, PharmD Pharmacy Resident  02/15/2023 11:07 AM

## 2023-03-07 NOTE — Progress Notes (Signed)
Verbal order from Zada Girt, NP at bedside to increase levo from to 

## 2023-03-07 NOTE — Progress Notes (Signed)
Spoke with doctor that was on call for surgery after speaking with the critical care team. Lactic acid has increased to 6, stoma has changed from dusky to dark. He stated that he would come by to look himself.

## 2023-03-07 NOTE — Progress Notes (Signed)
Dr. Belia Heman and Delton See, NP made aware of critical calcium of 4.6 and critical potassium of 6.2. Orders received.

## 2023-03-07 NOTE — Plan of Care (Signed)
Consult noted for GOC. Patient transitioning to comfort care with CCM; patient is actively dying at this time with family at bedside. Per conversation with CCM, PMT will D/C consult.

## 2023-03-07 NOTE — Progress Notes (Addendum)
Verbal order from Zada Girt, NP to increase levo from to .

## 2023-03-07 NOTE — Progress Notes (Signed)
Nutrition Follow Up Note   DOCUMENTATION CODES:   Not applicable  INTERVENTION:   Once appropriate for tube feeds, recommend:  Vital 1.2@50ml /hr- Initiate at 86ml/hr and increase by 39ml/hr q 8 hours until goal rate is reached.   ProSource TF 20- Give 60ml daily via tube, each supplement provides 80kcal and 20g of protein.   Free water flushes 30ml q4 hours to maintain tube patency   Regimen provides 1520kcal/day, 110g/day protein and 1128ml/day of free water.   Pt at high refeed risk; recommend monitor potassium, magnesium and phosphorus labs daily until stable  Daily weights   NUTRITION DIAGNOSIS:   Inadequate oral intake related to inability to eat (pt sedated and ventilated) as evidenced by NPO status. -ongoing   GOAL:   Provide needs based on ASPEN/SCCM guidelines -not met   MONITOR:   Vent status, Labs, Weight trends, I & O's, Skin  ASSESSMENT:   62 y/o female with h/o COPD, CAD, CHF, HTN, HLD and NSTEMI who is admitted with pneumatosis coli, AKI and abdominal sepsis now s/p subtotal colectomy with end ileostomy 11/4.  Pt remains ventilated. NGT in place to LIS with output. Pt with worsening metabolic and lactic acidosis. Pt remains on pressors and is not stable for any nutrition support today. Pt's ileostomy stoma is necrotic with concerns for ischemic bowel. Pt with worsening abdominal distension. Family deciding about GOC. Pt may return to the OR for exploratory. Per chart, pt is up ~15lbs since admission; pt is up ~24lbs from her UBW. Pt + 16.0L on her I & Os.   Medications reviewed and include: insulin, protonix, thiamine, levophed, zosyn, Na bicarbonate, vasopressin   Labs reviewed: Na 130(L), K 6.2(H), BUN 89(H), creat 4.16(H), Ca 4.6(L), P 10.5(H), Mg 2.4 wnl, albumin <1.5(L) Hgb 6.6(L), Hct 19.8(L) Cbgs- 94, 245, 152, 155 x 24 hrs  Patient is currently intubated on ventilator support MV: 8.2 L/min Temp (24hrs), Avg:98.4 F (36.9 C), Min:96.4 F  (35.8 C), Max:100.4 F (38 C)  Propofol: none   MAP- <   UOP- 27ml   NUTRITION - FOCUSED PHYSICAL EXAM:  Flowsheet Row Most Recent Value  Orbital Region No depletion  Upper Arm Region No depletion  Thoracic and Lumbar Region No depletion  Buccal Region No depletion  Temple Region No depletion  Clavicle Bone Region No depletion  Clavicle and Acromion Bone Region No depletion  Scapular Bone Region No depletion  Dorsal Hand Mild depletion  Patellar Region No depletion  Anterior Thigh Region No depletion  Posterior Calf Region No depletion  Edema (RD Assessment) None  Hair Reviewed  Eyes Reviewed  Mouth Reviewed  Skin Reviewed  Nails Reviewed   Diet Order:   Diet Order             Diet NPO time specified  Diet effective now                  EDUCATION NEEDS:   Not appropriate for education at this time  Skin:  Skin Assessment: Reviewed RN Assessment (ecchymosis, incision abdomen)  Last BM:  pta  Height:   Ht Readings from Last 1 Encounters:  02/07/23 5\' 7"  (1.702 m)    Weight:   Wt Readings from Last 1 Encounters:  03/05/2023 67.5 kg    Ideal Body Weight:  61.36 kg  BMI:  Body mass index is 23.31 kg/m.  Estimated Nutritional Needs:   Kcal:  1414kcal/day  Protein:  95-110g/day  Fluid:  1.7-1.9L/day  Betsey Holiday MS,  RD, LDN Please refer to [facility] for RD and/or RD on-call/weekend/after hours pager

## 2023-03-07 NOTE — Progress Notes (Signed)
Verbal order from Zada Girt, NP at bedside to increase Vaso to 0.04.

## 2023-03-07 NOTE — IPAL (Signed)
  Interdisciplinary Goals of Care Family Meeting   Date carried out: 03/04/2023  Location of the meeting: Conference room  Member's involved: Nurse Practitioner, Bedside Registered Nurse, and Family Member or next of kin  Durable Power of Attorney or acting medical decision maker: Pts sister Katie Moody along with additional siblings   Discussion: We discussed goals of care for [name] .  Discussed despite aggressive treatment pt continues to decline.  She has worsening multiorgan failure.  Pt also has necrotic bowel and is HIGH RISK for death if she returns to the OR for a second look according to General Surgery.  Pts family have decided not to proceed with surgery.  During goals of care conversations pt developed worsening hypotension and went into ventricular tachycardia on cardiac monitor.  Requested family come to bedside to see pt.  Again we discussed goals of treatment going forward. Pts family decided to proceed with Comfort Measures Only once all family members arrive at bedside. Pts family did ask to give pt pain medication now for pain management.  Morphine gtt initiated at 1 mg/hr.    Code status:   Code Status: Do not attempt resuscitation (DNR) - Comfort care   Disposition: In-patient comfort care  Time spent for the meeting: 30 minutes    Zada Girt, AGNP  Pulmonary/Critical Care Pager [phone] (please enter 7 digits) PCCM Consult Pager [phone] (please enter 7 digits) a

## 2023-03-07 NOTE — Progress Notes (Signed)
[facility] phone number given to patient's sister, Harriett Sine, to call when she has funeral home information.

## 2023-03-07 NOTE — Progress Notes (Signed)
Morphine infusion started at 1mg 

## 2023-03-07 NOTE — Progress Notes (Signed)
Verbal order from Zada Girt, NP at bedside to increase levo from to .

## 2023-03-07 NOTE — Progress Notes (Addendum)
[facility] SURGICAL PROGRESS NOTE  Hospital Day(s): 3.   Post op day(s): 1 Day Post-Op.   Interval History:  Patient seen and examined Fever to 100.77F at 0130 Vasopressor Support this AM: - 26 mcg/min Levophed - 0.03 units vasopressin  She remains intubated, less responsive Lactic acid to 6.0 (from 4.2) Worsening renal function; sCr - 4.47; UO - 27 ccs Hypocalcemia to 5.0 (corrected for hypoalbuminemia to 6.9) Hyperkalemia to 5.6 Hyperphosphatemia to 10.5 Leukocytosis to 17.2K She is on Zosyn NGT in place; output 800 ccs  Vital signs in last 24 hours: [min-max] current  Temp:  [96.8 F (36 C)-100.4 F (38 C)] 99.1 F (37.3 C) (11/05 0430) Pulse Rate:  [26-114] 38 (11/04 2045) Resp:  [16-37] 18 (11/05 0430) BP: (140-149)/(56-61) 149/61 (11/04 1730) SpO2:  [72 %-100 %] 87 % (11/05 0430) Arterial Line BP: (107-150)/(45-69) 126/59 (11/05 0430) FiO2 (%):  [60 %] 60 % (11/05 0400)     Height: 5\' 7"  (170.2 cm) Weight: 64.5 kg BMI (Calculated): 22.27   Intake/Output last 2 shifts:  11/04 0701 - 11/05 0700 In: 6966.1 [I.V.:4348.3; Blood:302; NG/GT:30; IV Piggyback:2285.8] Out: 827 [Urine:27; Emesis/NG output:800]   Physical Exam:  Constitutional: intubated; unresponsive Respiratory: intubated, on ventilator Cardiovascular: tachycardic and sinus rhythm  Gastrointestinal: Soft, she is markedly more distended this AM, unable to assess tenderness. Ileostomy in the right mid-abdomen, this has continued to become progressively more ischemic appearing, I am able to digitize with my 5th finger to the level of the fascia Genitourinary: Foley in place; minimal UO Integumentary: Laparotomy is CDI with staples, scant old blood on dressing   Labs:     Latest Ref Rng & Units 02/14/2023    4:46 AM 02/22/2023    9:26 PM 02/27/2023    2:04 PM  CBC  WBC 4.0 - 10.5 K/uL 17.2     Hemoglobin 12.0 - 15.0 g/dL 8.4  9.0  7.1   Hematocrit 36.0 - 46.0 % 24.3  27.4  22.1    Platelets 150 - 400 K/uL 157         Latest Ref Rng & Units 03/05/2023    4:46 AM 02/12/2023    9:26 PM 03/06/2023    5:33 PM  CMP  Glucose 70 - 99 mg/dL 578  469    BUN 8 - 23 mg/dL 97  98    Creatinine 6.29 - 1.00 mg/dL 5.28  4.13    Sodium 244 - 145 mmol/L 130  133    Potassium 3.5 - 5.1 mmol/L 5.6  4.4    Chloride 98 - 111 mmol/L 89  94    CO2 22 - 32 mmol/L 22  25    Calcium 8.9 - 10.3 mg/dL 5.0  5.0  4.5   Total Protein 6.5 - 8.1 g/dL 3.6  3.8    Total Bilirubin <1.2 mg/dL 1.4  1.6    Alkaline Phos 38 - 126 U/L 91  66    AST 15 - 41 U/L 301  246    ALT 0 - 44 U/L 68  61       Imaging studies: No new pertinent imaging studies   Assessment/Plan:  62 y.o. 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   - Unfortunately, she seems to have continued to clinically deteriorate overnight. She is now in MSOF with oliguria, worsening lactic acidosis, and increased need for vasopressor need. Her ileostomy has  become necrotic as well. At this point, we will await discussion with family regarding next steps and how aggressive they wish to be. We can offer re-intervention surgically and "second look" but concern is that more of her small bowel has progressively become ischemic as well and there may not be anything we can offer. She also carries a significant perioperative mortality and morbidity at his time given her current status.   - Will continue aggressive supportive care and follow up family discussion.   All of the above findings and recommendations were discussed with the medical team.    12:12 PM  Was present for family discussion this morning around 0845. Discussed her current disease process and concern for MSOF. I presented the role for reinnervation surgically with the understanding she will have an extremely high perioperative mortality and likely would not  survive the procedure. There is also a chance her small bowel has now become ischemic and there may not be much we can offer. PCCM MD also present to discuss role for DNR. Family consented to DNR. They wished to have some time to decide about how aggressive to be regarding surgery, dialysis, etc.   I have returned to bedside multiple times this morning since this discussion and have not heard from family regarding their decision. I will continue to make efforts. Patient unfortunately continues to show evidence of progressive MSOF and decline. We will remain readily available and await family decision.   -- Lynden Oxford, PA-C [facility] 02/17/2023, 7:18 AM M-F: 7am - 4pm

## 2023-03-07 NOTE — Progress Notes (Signed)
This RN and Zada Girt, NP at bedside with family. A-line flatted, monitor reading asystole. Time of death pronounced by Zada Girt, NP [phone].

## 2023-03-07 NOTE — Progress Notes (Signed)
   02/24/2023 1300  Spiritual Encounters  Type of Visit Initial  Care provided to: Pt and family  Conversation partners present during encounter Physician;Nurse  Referral source Family;Nurse (RN/NT/LPN);Physician  Reason for visit End-of-life  OnCall Visit Yes  Spiritual Framework  Presenting Themes Meaning/purpose/sources of inspiration;Rituals and practive  Community/Connection Family  Patient Stress Factors Major life changes;Not reviewed  Family Stress Factors Major life changes  Interventions  Spiritual Care Interventions Made Established relationship of care and support;Compassionate presence;Reflective listening;Normalization of emotions;Prayer  Intervention Outcomes  Outcomes Connection to spiritual care;Awareness of support  Spiritual Care Plan  Spiritual Care Issues Still Outstanding Chaplain will continue to follow   Patient is none responsive and medical team allow with family made a decision to take her off of life support. I prayer with the family and let them know that we are here for them during this time.

## 2023-03-07 NOTE — Anesthesia Postprocedure Evaluation (Signed)
Anesthesia Post Note  Patient: Katie Moody  Procedure(s) Performed: EXPLORATORY LAPAROTOMY  Patient location during evaluation: SICU Anesthesia Type: General Level of consciousness: sedated Pain management: pain level controlled Vital Signs Assessment: post-procedure vital signs reviewed and stable Respiratory status: patient remains intubated per anesthesia plan Cardiovascular status: stable Postop Assessment: no apparent nausea or vomiting Anesthetic complications: no   No notable events documented.   Last Vitals:  Vitals:   02/10/2023 0645 02/11/2023 0700  BP:    Pulse:    Resp: (!) 23 (!) 29  Temp: 37.2 C 37.1 C  SpO2: (!) 76% (!) 76%    Last Pain:  Vitals:   03/05/2023 0400  TempSrc: Esophageal  PainSc:                  Karoline Caldwell

## 2023-03-07 NOTE — Progress Notes (Signed)
NAME:  Katie Moody, MRN:  [id number], DOB:  March 29, 1961, LOS: 3 ADMISSION DATE:  02/22/2023, CONSULTATION DATE: 02/07/2023 REFERRING MD: Dr. Mayford Knife, CHIEF COMPLAINT: Sepsis    History of Present Illness:  Patient is a 62 year old female presenting to the hospital with abdominal pain. She was found to have pneumatosis intestinalis and is admitted to for further management.   Patient presented today with abdominal pain of a week's duration. She was noted to have diffuse and constant abdominal pain with a distended abdomen. She reported nausea and several episodes of non-bilious and non-blood vomiting. She had no fevers or chills. On presentation, she was noted to be hypotensive, with improvement following fluid resuscitation.   Imaging in the ED included a CT scan of the abdomen and pelvis which was notable for pneumatosis of the cecum and ascending colon, concerning for typhlitis. There was also associated ileus. She was started on IV antibiotics and admitted. Overnight, blood pressure remained soft. PCCM consulted for initiation of vasopressors for management of septic shock.  Pertinent  Medical History  CAD HFpEF HLD HTN COPD on Anoro  Significant Hospital Events: Including procedures, antibiotic start and stop dates in addition to other pertinent events   02/15/2023: admit to [facility], [place] with pneumatosis 02/07/2023: transfer to ICU, increased pressor requirements. CVC and aline placed 03/05/2023: Pt decompensated overnight with worsening septic shock, acute respiratory failure, and severe metabolic acidosis requiring mechanical intubation and multiple vasopressor support.  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  Postop pt remained mechanically intubated requiring  levophed/vasopressin/epinephrine/sodium bicarb gtts.  Later during the shift pt started following commands  02/05/2023: Pt with worsening metabolic and lactic acidosis.  She is no longer following commands.  Now requiring vasopressin @0 .03 units/min and levophed gtt @26  mcg/min.  Ileostomy stoma is now necrotic with worsening abdominal distension.  General Surgery evaluated pt and findings are concerning for progressive small bowel ischemia.  General surgery spoke with family and offered surgery to take a "second look," however family informed she carries perioperative mortality and morbidity.  Family changed code status to DNR and are trying to make a decision regarding surgery   Micro Data:   11/03: MRSA PCR>>negative  11/04: Blood x2>>negative  11/04: Tracheal aspirate>>Few wbc present, predominantly pmn; rare gram negative rods; rare budding yeast seen  Anti-infectives (From admission, onward)    Start     Dose/Rate Route Frequency Ordered Stop   02/25/2023 1400  piperacillin-tazobactam (ZOSYN) IVPB 2.25 g        2.25 g 100 mL/hr over 30 Minutes Intravenous Every 8 hours 02/18/2023 0645     02/14/2023 0400  voriconazole (VFEND) 240 mg in sodium chloride 0.9 % 100 mL IVPB  Status:  Discontinued       Placed in "Followed by" Linked Group   4 mg/kg  60.6 kg 62 mL/hr over 120 Minutes Intravenous Every 12 hours 02/07/23 0149 02/11/2023 1543   02/07/23 0400  piperacillin-tazobactam (ZOSYN) IVPB 3.375 g        3.375 g 12.5 mL/hr over 240 Minutes Intravenous Every 8 hours 02/22/2023 2354 02/18/2023 0954   02/07/23 0400  voriconazole (VFEND) 360 mg in sodium chloride 0.9 % 100 mL IVPB       Placed in "Followed by" Linked Group   6 mg/kg  60.6 kg 68 mL/hr over 120 Minutes Intravenous Every 12 hours 02/07/23 0149  02/07/23 1900   02/21/2023 2130  piperacillin-tazobactam (ZOSYN) IVPB 3.375 g        3.375 g 100 mL/hr over 30 Minutes Intravenous  Once 02/10/2023 2123 02/27/2023 2215      Interim History /  Subjective:  As outlined above under significant events   Objective   Blood pressure (!) 149/61, pulse (!) 38, temperature 98.8 F (37.1 C), resp. rate (!) 29, height 5\' 7"  (1.702 m), weight 67.5 kg, SpO2 (!) 76%.    Vent Mode: PRVC FiO2 (%):  [60 %] 60 % Set Rate:  [18 bmp] 18 bmp Vt Set:  [450 mL] 450 mL PEEP:  [5 cmH20] 5 cmH20 Plateau Pressure:  [13 cmH20-17 cmH20] 17 cmH20   Intake/Output Summary (Last 24 hours) at 02/23/2023 0981 Last data filed at 03/02/2023 0700 Gross per 24 hour  Intake 6514.22 ml  Output 1027 ml  Net 5487.22 ml   Filed Weights   02/07/23 0141 02/07/23 1035 03/04/2023 0500  Weight: 60.6 kg 64.5 kg 67.5 kg   Examination: General: Acutely-ill appearing female, mild respiratory distress mechanically intubated  HENT: Supple, no JVD  Lungs: Diminished throughout, even, non labored  Cardiovascular: Sinus tachycardia, s1s2, no m/r/g; bilateral brachial/bilateral femoral pulses 1+ via doppler; 2+ generalized edema  Abdomen: Faint BS x4, obese, taut, mildly distended  Extremities: Normal bulk  Skin: Midline surgical incision with small amount of bright red blood on gauze dressing; RLQ ileostomy stoma dusky appears narcotic  Neuro: Eyes slightly open, however unable to follow commands; withdrawing from painful stimulation.  Bilateral pupils 2 mm round/sluggish  GU: Indwelling foley catheter pt currently oliguric    Resolved Hospital Problem list     Assessment & Plan:   #Acute metabolic encephalopathy  #Mechanical intubation pain/discomfort  #Post-op pain  CT Head 11/4: Stable and normal for age noncontrast CT appearance of the brain. Chronic and postinflammatory appearing left mastoid effusion. - Correct metabolic derangements  - Maintain sleep/wake cycle  - Promote family presence at bedside  - Maintain RASS goal 0 to -1 - PAD protocol to maintain RASS goal: prn fentanyl also for pain management   #Severe septic shock  #HFpEF  - Continuous telemetry  monitoring  - Aggressive iv fluid resuscitation and/or vasopressors to maintain map 65 or higher  - Hold outpatient antihypertensives   #Acute hypercapnic respiratory failure  #Severe metabolic acidosis  #COPD  #Mechanical intubation  - Full vent support for now: vent settings reviewed and established  - Continue lung protective strategies  - Maintain plateau pressures less than 30 cm H2O - VAP bundle implemented - Intermittent CXR's and ABG's - Scheduled and prn bronchodilator therapy   #Worsening acute kidney injury secondary to ATN #Hyperkalemia  #Hypocalcemia  #Hyponatremia  #Worsening severe metabolic acidosis  #Worsening lactic acidosis  - Trend BMP, lactic acid, and vbg  - Replace electrolytes as indicated - Strict I's and O's - Avoid nephrotoxic medications and renally dose medications  - Continue continuous bicarb infusion for now; will give an additional 2 amps of bicarb  - Nephrology consulted appreciate input  #Worsening sepsis concerning for worsening small bowel ischemia  #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 secondary to 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 s/p exp lap and ileostomy~03/03/2023 - Trend WBC and monitor fever curve  - Trend PCT  - Follow cultures  - Continue abx as outlined above  - Antifungal discontinued due to worsening AKI  - General surgery consulted appreciate input: family currently deciding if they would like pt to return  back to the OR   - OGT to LIS   #Postop acute blood loss anemia  - Trend CBC  - Monitor for s/sx of bleeding - Transfuse for hgb <7  #Prediabetes  #Hyperglycemia  - CBG's q4hrs  - SSI  - Follow hyper/hypoglycemic protocol   Best Practice (right click and "Reselect all SmartList Selections" daily)   Diet/type: NPO DVT prophylaxis: SCD's  GI prophylaxis: PPI Lines: Central line and arterial line  Foley:  Yes, and it is still needed Code Status: DNR Last date of multidisciplinary  goals of care discussion [03/05/2023]  11/05: Pts family updated extensively by General Surgery PA and ICU Intensivist Dr. Belia Heman regarding pts condition and current plan of care  Labs   CBC: Recent Labs  Lab 02/25/2023 1746 02/07/23 0339 02/26/2023 0417 02/07/2023 1211 03/01/2023 1404 02/18/2023 2126 02/21/2023 0446  WBC 9.9 8.5 12.2* 8.6  --   --  17.2*  NEUTROABS  --   --   --  6.9  --   --   --   HGB 13.5 12.1 8.9* 8.1* 7.1* 9.0* 8.4*  HCT 40.6 36.7 27.8* 25.1* 22.1* 27.4* 24.3*  MCV 91.6 93.9 95.9 96.5  --   --  88.7  PLT 377 343 266 224  --   --  157    Basic Metabolic Panel: Recent Labs  Lab 03/02/2023 1746 02/07/23 0339 02/26/2023 0417 02/26/2023 0940 02/14/2023 1733 02/13/2023 2126 02/15/2023 0446  NA 134* 135 131* 135  --  133* 130*  K 3.3* 3.8 5.3* 4.8  --  4.4 5.6*  CL 89* 97* 99 99  --  94* 89*  CO2 28 22 17* 20*  --  25 22  GLUCOSE 131* 113* 212* 204*  --  195* 167*  BUN 87* 88* 95* 103*  --  98* 97*  CREATININE 2.18* 2.50* 4.06* 4.19*  --  4.34* 4.47*  CALCIUM 8.2* 6.9* 5.5* 5.2* 4.5* 5.0* 5.0*  MG 2.9* 2.3  --  2.2  --   --  2.4  PHOS  --   --   --  8.4*  --   --  10.5*   GFR: Estimated Creatinine Clearance: 12.4 mL/min (A) (by C-G formula based on SCr of 4.47 mg/dL (H)). Recent Labs  Lab 02/07/23 0339 02/07/23 0623 02/27/2023 0417 03/01/2023 0809 02/23/2023 1211 02/25/2023 2126 03/06/2023 0018 02/26/2023 0446  PROCALCITON  --   --  19.58  --   --   --   --   --   WBC 8.5  --  12.2*  --  8.6  --   --  17.2*  LATICACIDVEN  --    < > 2.7*   < > 4.6* 4.0* 4.2* 6.0*   < > = values in this interval not displayed.    Liver Function Tests: Recent Labs  Lab 02/17/2023 1746 02/07/23 0339 02/07/2023 0417 03/02/2023 2126 02/12/2023 0446  AST 33  --  72* 246* 301*  ALT 14  --  24 61* 68*  ALKPHOS 65  --  44 66 91  BILITOT 3.1*  --  2.0* 1.6* 1.4*  PROT 7.8  --  5.2* 3.8* 3.6*  ALBUMIN 3.9 2.9* 2.8* 1.8* 1.6*   Recent Labs  Lab 03/05/2023 1746  LIPASE 28   No results for input(s):  "AMMONIA" in the last 168 hours.  ABG    Component Value Date/Time   PHART 7.25 (L) 02/20/2023 0447   PCO2ART 54 (H) 02/24/2023 0447   PO2ART 91  02/28/2023 0447   HCO3 23.7 02/07/2023 0447   ACIDBASEDEF 4.2 (H) 02/25/2023 0447   O2SAT 99.5 02/24/2023 0447     Coagulation Profile: Recent Labs  Lab 02/07/23 0339  INR 1.4*    Cardiac Enzymes: No results for input(s): "CKTOTAL", "CKMB", "CKMBINDEX", "TROPONINI" in the last 168 hours.  HbA1C: Hgb A1c MFr Bld  Date/Time Value Ref Range Status  06/01/2022 09:06 AM 5.8 (H) 4.8 - 5.6 % Final    Comment:    (NOTE)         Prediabetes: 5.7 - 6.4         Diabetes: >6.4         Glycemic control for adults with diabetes: <7.0     CBG: Recent Labs  Lab 02/12/2023 1651 03/03/2023 1936 03/02/2023 0017 02/23/2023 0351 02/17/2023 0759  GLUCAP 148* 114* 155* 152* 245*    Review of Systems:   Unable to assess pt mechanically intubated   Past Medical History:  She,  has a past medical history of CAD (coronary artery disease), Chronic diastolic CHF (congestive heart failure) (HCC), COPD (chronic obstructive pulmonary disease) (HCC), and Hypertension.   Surgical History:   Past Surgical History:  Procedure Laterality Date   ABDOMINAL HYSTERECTOMY     LAPAROTOMY N/A 02/27/2023   Procedure: EXPLORATORY LAPAROTOMY;  Surgeon: Campbell Lerner, MD;  Location: [facility] ORS;  Service: General;  Laterality: N/A;   LEFT HEART CATH AND CORONARY ANGIOGRAPHY N/A 06/04/2022   Procedure: LEFT HEART CATH AND CORONARY ANGIOGRAPHY;  Surgeon: Marykay Lex, MD;  Location: [facility] INVASIVE CV LAB;  Service: Cardiovascular;  Laterality: N/A;     Social History:   reports that she quit smoking about 9 months ago. Her smoking use included cigarettes. She started smoking about 30 years ago. She has a 7.5 pack-year smoking history. She has never used smokeless tobacco. She reports current alcohol use. She reports that she does not use drugs.   Family History:   Her family history includes Heart attack in her mother; Stroke in her mother.   Allergies No Known Allergies   Home Medications  Prior to Admission medications   Medication Sig Start Date End Date Taking? Authorizing Provider  aspirin EC 81 MG tablet Take 1 tablet (81 mg total) by mouth daily. Swallow whole. 07/20/22  Yes Furth, Cadence H, PA-C  atorvastatin (LIPITOR) 40 MG tablet Take 1 tablet (40 mg total) by mouth daily. 11/30/22  Yes Furth, Cadence H, PA-C  losartan (COZAAR) 50 MG tablet Take 1 tablet (50 mg total) by mouth daily. 12/23/22  Yes Furth, Cadence H, PA-C  meclizine (ANTIVERT) 25 MG tablet Take 1 tablet (25 mg total) by mouth 3 (three) times daily as needed for dizziness. 08/23/21  Yes Cuthriell, Delorise Royals, PA-C  PROVENTIL HFA 108 (90 Base) MCG/ACT inhaler Inhale 2 puffs into the lungs every 4 (four) hours as needed for shortness of breath or wheezing. 12/22/21  Yes [provider]  umeclidinium-vilanterol (ANORO ELLIPTA) 62.5-25 MCG/ACT AEPB Inhale 1 puff into the lungs daily. 08/04/22  Yes Dgayli, Lianne Bushy, MD  metoprolol succinate (TOPROL-XL) 25 MG 24 hr tablet Take 1 tablet (25 mg total) by mouth daily. Patient not taking: Reported on 02/07/2023 06/09/22   Arnetha Courser, MD  Multiple Vitamin (MULTIVITAMIN WITH MINERALS) TABS tablet Take 1 tablet by mouth daily. Patient not taking: Reported on 02/07/2023 06/09/22   Arnetha Courser, MD     Critical care time: 60 minutes      Zada Girt, AGNP  Pulmonary/Critical  Care Pager [phone] (please enter 7 digits) PCCM Consult Pager [phone] (please enter 7 digits)

## 2023-03-07 NOTE — Progress Notes (Signed)
ET tube removed per MD order @ 1400.

## 2023-03-07 DEATH — deceased

## 2023-10-31 IMAGING — CR DG CHEST 2V
1 series · 2 of 2 positions shown · non-contrast
Comparison: None available

CLINICAL DATA: shortness of breath

EXAM:
CHEST - 2 VIEW

[Series 1: dg chest 2 view · 0.14mm/px · 2 of 2 slices shown]
[im 1/2]
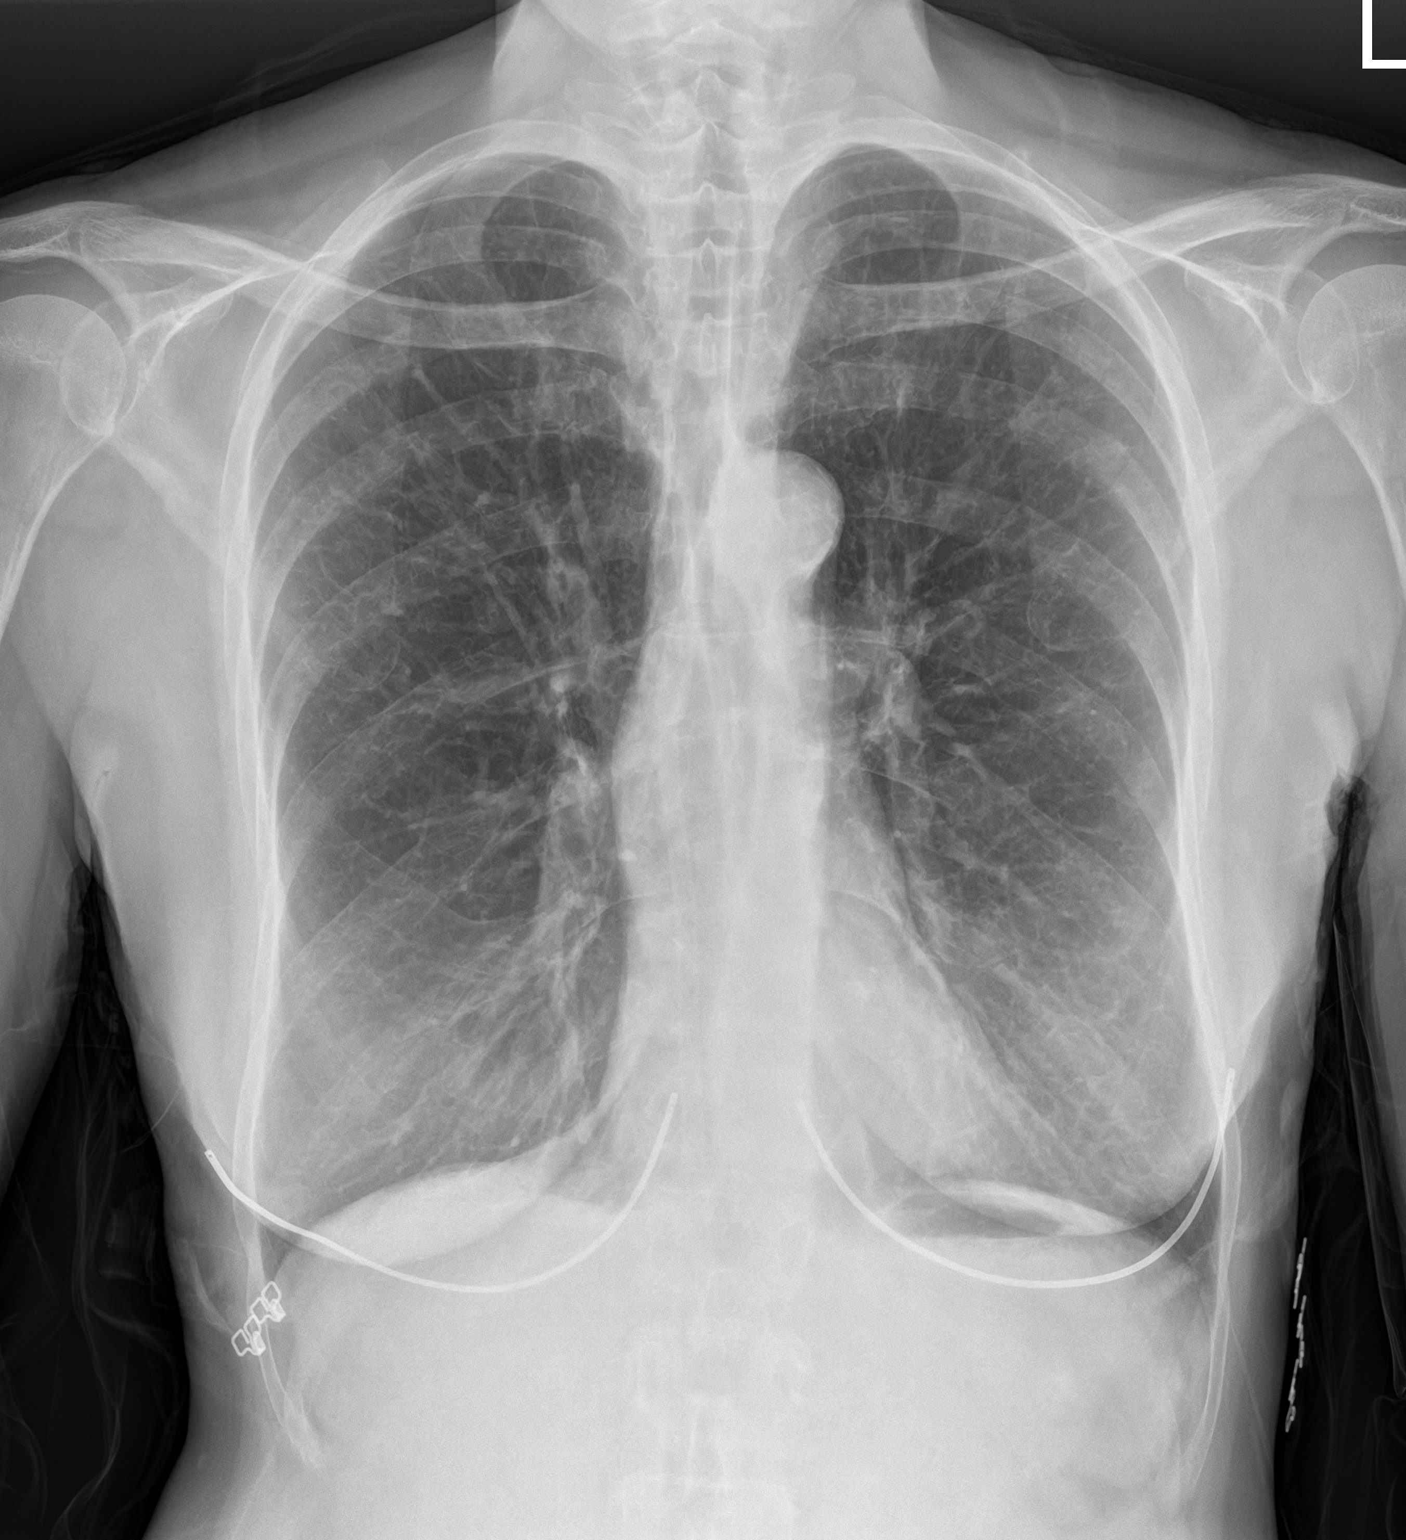
[im 2/2]
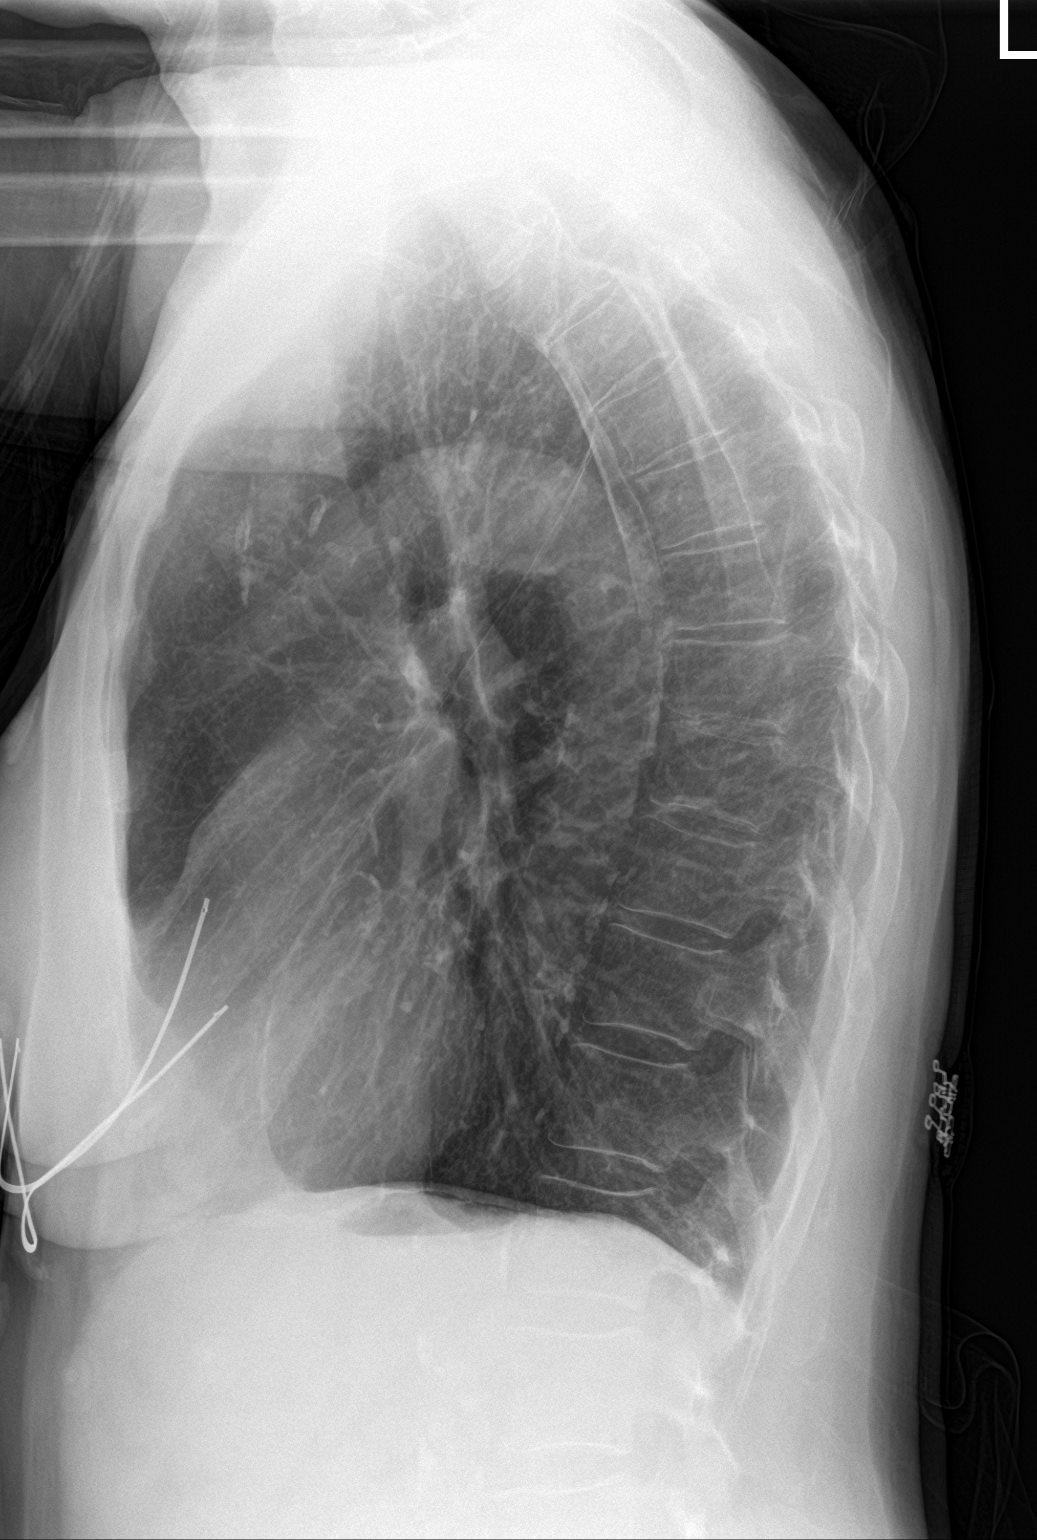

[2 of 2 positions shown; findings below may reference images not displayed]

FINDINGS: Cardiomediastinal silhouette and pulmonary vasculature are within
normal limits. Diffuse atherosclerotic calcifications of the
thoracic aorta.

Lungs are hyperexpanded, but otherwise clear clear.
IMPRESSION: 1. No acute cardiopulmonary process
2. Emphysema
